# Patient Record
Sex: Male | Born: 1960 | Race: Black or African American | Hispanic: No | Smoking: Current every day smoker
Health system: Southern US, Community
[De-identification: ages and names within clinical notes are randomized; demographics above are authoritative.]

## PROBLEM LIST (undated history)

## (undated) DIAGNOSIS — M1990 Unspecified osteoarthritis, unspecified site: Secondary | ICD-10-CM

## (undated) DIAGNOSIS — F10939 Alcohol use, unspecified with withdrawal, unspecified: Secondary | ICD-10-CM

## (undated) HISTORY — DX: Unspecified osteoarthritis, unspecified site: M19.90

## (undated) HISTORY — DX: Alcohol use, unspecified with withdrawal, unspecified: F10.939

## (undated) HISTORY — PX: EXPLORATORY LAPAROTOMY: SUR591

---

## 2017-04-10 ENCOUNTER — Encounter (HOSPITAL_COMMUNITY): Payer: Self-pay | Admitting: *Deleted

## 2017-04-10 ENCOUNTER — Encounter (HOSPITAL_COMMUNITY): Payer: Self-pay | Admitting: Emergency Medicine

## 2017-04-10 ENCOUNTER — Emergency Department (HOSPITAL_COMMUNITY)
Admission: EM | Admit: 2017-04-10 | Discharge: 2017-04-10 | Disposition: A | Discharge status: Home / Self Care | Payer: Self-pay | Attending: Emergency Medicine | Admitting: Emergency Medicine

## 2017-04-10 ENCOUNTER — Emergency Department (HOSPITAL_COMMUNITY): Payer: Self-pay

## 2017-04-10 DIAGNOSIS — Y939 Activity, unspecified: Secondary | ICD-10-CM | POA: Insufficient documentation

## 2017-04-10 DIAGNOSIS — S0240EA Zygomatic fracture, right side, initial encounter for closed fracture: Secondary | ICD-10-CM | POA: Insufficient documentation

## 2017-04-10 DIAGNOSIS — S01411A Laceration without foreign body of right cheek and temporomandibular area, initial encounter: Secondary | ICD-10-CM | POA: Insufficient documentation

## 2017-04-10 DIAGNOSIS — Y999 Unspecified external cause status: Secondary | ICD-10-CM | POA: Insufficient documentation

## 2017-04-10 DIAGNOSIS — Z5321 Procedure and treatment not carried out due to patient leaving prior to being seen by health care provider: Secondary | ICD-10-CM | POA: Insufficient documentation

## 2017-04-10 DIAGNOSIS — R51 Headache: Secondary | ICD-10-CM | POA: Insufficient documentation

## 2017-04-10 DIAGNOSIS — Y929 Unspecified place or not applicable: Secondary | ICD-10-CM | POA: Insufficient documentation

## 2017-04-10 DIAGNOSIS — F17290 Nicotine dependence, other tobacco product, uncomplicated: Secondary | ICD-10-CM | POA: Insufficient documentation

## 2017-04-10 DIAGNOSIS — S0181XA Laceration without foreign body of other part of head, initial encounter: Secondary | ICD-10-CM

## 2017-04-10 DIAGNOSIS — S01312A Laceration without foreign body of left ear, initial encounter: Secondary | ICD-10-CM | POA: Insufficient documentation

## 2017-04-10 DIAGNOSIS — S02641A Fracture of ramus of right mandible, initial encounter for closed fracture: Secondary | ICD-10-CM | POA: Insufficient documentation

## 2017-04-10 DIAGNOSIS — Z23 Encounter for immunization: Secondary | ICD-10-CM | POA: Insufficient documentation

## 2017-04-10 DIAGNOSIS — T07XXXA Unspecified multiple injuries, initial encounter: Secondary | ICD-10-CM

## 2017-04-10 DIAGNOSIS — S0232XA Fracture of orbital floor, left side, initial encounter for closed fracture: Secondary | ICD-10-CM | POA: Insufficient documentation

## 2017-04-10 MED ORDER — LIDOCAINE-EPINEPHRINE (PF) 1 %-1:200000 IJ SOLN
INTRAMUSCULAR | Status: AC
  Administered 2017-04-10: 30 mL
  Filled 2017-04-10: qty 30

## 2017-04-10 MED ORDER — IBUPROFEN 800 MG PO TABS: 800.0000 mg | ORAL_TABLET | Freq: Three times a day (TID) | ORAL | 0 refills | Status: DC

## 2017-04-10 MED ORDER — POVIDONE-IODINE 10 % EX SOLN
CUTANEOUS | Status: AC
  Filled 2017-04-10: qty 15

## 2017-04-10 MED ORDER — HYDROCODONE-ACETAMINOPHEN 5-325 MG PO TABS
2.0000 | ORAL_TABLET | Freq: Once | ORAL | Status: AC
  Administered 2017-04-10: 2 via ORAL
  Filled 2017-04-10: qty 2

## 2017-04-10 MED ORDER — LIDOCAINE-EPINEPHRINE 1 %-1:100000 IJ SOLN
10.0000 mL | Freq: Once | INTRAMUSCULAR | Status: DC
  Filled 2017-04-10: qty 10

## 2017-04-10 MED ORDER — TETANUS-DIPHTH-ACELL PERTUSSIS 5-2.5-18.5 LF-MCG/0.5 IM SUSP
0.5000 mL | Freq: Once | INTRAMUSCULAR | Status: AC
  Administered 2017-04-10: 0.5 mL via INTRAMUSCULAR
  Filled 2017-04-10: qty 0.5

## 2017-04-10 MED ORDER — HYDROCODONE-ACETAMINOPHEN 5-325 MG PO TABS: 2.0000 | ORAL_TABLET | ORAL | 0 refills | Status: DC | PRN

## 2017-04-10 MED ORDER — AMOXICILLIN 500 MG PO CAPS: 500.0000 mg | ORAL_CAPSULE | Freq: Three times a day (TID) | ORAL | 0 refills | Status: DC
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
## 2017-04-10 NOTE — ED Notes (Signed)
Called for room Not in WR

## 2017-04-10 NOTE — ED Notes (Signed)
Pt called for ED room. No answer in waiting room x 2. Registration reports that they saw pt walk out of ED entrance earlier and pt hasn't returned.
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
## 2017-04-29 ENCOUNTER — Telehealth: Payer: Self-pay | Admitting: Orthopaedic Surgery

## 2017-04-29 NOTE — Telephone Encounter (Signed)
Call from patient, said thought orthopaedic doctor would take care of fractured jaw. Per Dr Hilda LiasKeeling, this would not be for orthopaedic surgeon, it would be ear, nose, throat, and/or oral surgeon.  [facility], for date of emergency services 04/10/17, patient's care was discussed with specialists; [facility] [place] [facility] [place] [facility] [place] [facility]  Strongly encouraged patient to follow up with the specialists to whom he has been referred, per emergency room providers.

## 2017-05-10 DIAGNOSIS — S02601D Fracture of unspecified part of body of right mandible, subsequent encounter for fracture with routine healing: Secondary | ICD-10-CM | POA: Insufficient documentation

## 2017-05-10 DIAGNOSIS — S0240EA Zygomatic fracture, right side, initial encounter for closed fracture: Secondary | ICD-10-CM | POA: Insufficient documentation

## 2019-10-20 ENCOUNTER — Encounter (HOSPITAL_COMMUNITY): Payer: Self-pay | Admitting: *Deleted

## 2019-10-20 ENCOUNTER — Other Ambulatory Visit: Payer: Self-pay

## 2019-10-20 ENCOUNTER — Emergency Department (HOSPITAL_COMMUNITY)
Admission: EM | Admit: 2019-10-20 | Discharge: 2019-10-20 | Disposition: A | Discharge status: Home / Self Care | Payer: Self-pay | Attending: Emergency Medicine | Admitting: Emergency Medicine

## 2019-10-20 ENCOUNTER — Emergency Department (HOSPITAL_COMMUNITY): Payer: Self-pay

## 2019-10-20 DIAGNOSIS — R066 Hiccough: Secondary | ICD-10-CM

## 2019-10-20 DIAGNOSIS — F17200 Nicotine dependence, unspecified, uncomplicated: Secondary | ICD-10-CM | POA: Insufficient documentation

## 2019-10-20 DIAGNOSIS — I10 Essential (primary) hypertension: Secondary | ICD-10-CM | POA: Insufficient documentation

## 2019-10-20 LAB — CBC WITH DIFFERENTIAL/PLATELET
Abs Immature Granulocytes: 0.03 10*3/uL (ref 0.00–0.07)
Basophils Absolute: 0 10*3/uL (ref 0.0–0.1)
Basophils Relative: 1 %
Eosinophils Absolute: 0 10*3/uL (ref 0.0–0.5)
Eosinophils Relative: 0 %
HCT: 40.4 % (ref 39.0–52.0)
Hemoglobin: 13.4 g/dL (ref 13.0–17.0)
Immature Granulocytes: 1 %
Lymphocytes Relative: 15 %
Lymphs Abs: 1 10*3/uL (ref 0.7–4.0)
MCH: 30.7 pg (ref 26.0–34.0)
MCHC: 33.2 g/dL (ref 30.0–36.0)
MCV: 92.7 fL (ref 80.0–100.0)
Monocytes Absolute: 0.4 10*3/uL (ref 0.1–1.0)
Monocytes Relative: 7 %
Neutro Abs: 4.8 10*3/uL (ref 1.7–7.7)
Neutrophils Relative %: 76 %
Platelets: 117 10*3/uL — ABNORMAL LOW (ref 150–400)
RBC: 4.36 MIL/uL (ref 4.22–5.81)
RDW: 12.3 % (ref 11.5–15.5)
WBC: 6.3 10*3/uL (ref 4.0–10.5)
nRBC: 0 % (ref 0.0–0.2)

## 2019-10-20 LAB — BASIC METABOLIC PANEL
Anion gap: 15 (ref 5–15)
BUN: <5 mg/dL — ABNORMAL LOW (ref 6–20)
CO2: 23 mmol/L (ref 22–32)
Calcium: 9.2 mg/dL (ref 8.9–10.3)
Chloride: 94 mmol/L — ABNORMAL LOW (ref 98–111)
Creatinine, Ser: 0.67 mg/dL (ref 0.61–1.24)
GFR calc Af Amer: >60 mL/min (ref >60)
GFR calc non Af Amer: >60 mL/min (ref >60)
Glucose, Bld: 96 mg/dL (ref 70–99)
Potassium: 3.7 mmol/L (ref 3.5–5.1)
Sodium: 132 mmol/L — ABNORMAL LOW (ref 135–145)

## 2019-10-20 LAB — TROPONIN I (HIGH SENSITIVITY): Troponin I (High Sensitivity): 16 ng/L (ref <18)

## 2019-10-20 MED ORDER — CHLORPROMAZINE HCL 25 MG/ML IJ SOLN
25.0000 mg | Freq: Once | INTRAMUSCULAR | Status: AC
  Administered 2019-10-20: 25 mg via INTRAMUSCULAR
  Filled 2019-10-20: qty 1

## 2019-10-20 NOTE — ED Provider Notes (Signed)
[facility] EMERGENCY DEPARTMENT Provider Note   CSN: [id number] Arrival date & time: 10/20/19  1935     History Chief Complaint  Patient presents with  . Hiccups    Caleb Kim is a 59 y.o. male with no known significant past medical history but presents fairly hypertensive and presenting with a 4-day history of intermittent hiccups which have become intractable this evening.  He describes multiple episodes of hiccups x4 days lasting an hour or so and has developed a sharp mid sternal chest pain since the onset which he believes to be due to the hiccup process itself.  He denies shortness of breath, also no fevers or chills, no nausea but has had episodes of vomiting.  He also denies palpitations or diaphoresis.  He has found no alleviators for his symptoms.  This evening he has had a persistent protracted course which started around 4 PM and has yet to resolve.  He tried drinking vinegar which simply made him vomit but did not resolve hiccups.  The history is provided by the patient.       History reviewed. No pertinent past medical history.  There are no problems to display for this patient.   Past Surgical History:  Procedure Laterality Date  . EXPLORATORY LAPAROTOMY         History reviewed. No pertinent family history.  Social History   Tobacco Use  . Smoking status: Current Every Day Smoker    Types: Cigars  . Smokeless tobacco: Never Used  Substance Use Topics  . Alcohol use: Yes    Comment: 2-3 beers daily'  . Drug use: No    Home Medications Prior to Admission medications   Medication Sig Start Date End Date Taking? Authorizing Provider  amoxicillin (AMOXIL) 500 MG capsule Take 1 capsule (500 mg total) by mouth 3 (three) times daily. 04/10/17   Eber Hong, MD  HYDROcodone-acetaminophen (NORCO/VICODIN) 5-325 MG tablet Take 2 tablets by mouth every 4 (four) hours as needed. 04/10/17   Eber Hong, MD  ibuprofen (ADVIL,MOTRIN) 800 MG tablet Take 1  tablet (800 mg total) by mouth 3 (three) times daily. 04/10/17   Eber Hong, MD    Allergies    Patient has no known allergies.  Review of Systems   Review of Systems  Constitutional: Negative for chills and fever.  HENT: Negative for congestion and sore throat.   Eyes: Negative.   Respiratory: Negative for chest tightness and shortness of breath.   Cardiovascular: Positive for chest pain.  Gastrointestinal: Positive for vomiting. Negative for abdominal pain and nausea.  Genitourinary: Negative.   Musculoskeletal: Negative for arthralgias, joint swelling and neck pain.  Skin: Negative.  Negative for rash and wound.  Neurological: Negative for dizziness, weakness, light-headedness, numbness and headaches.  Psychiatric/Behavioral: Negative.     Physical Exam Updated Vital Signs BP (!) 160/102 (BP Location: Right Arm)   Pulse 91   Temp 98.7 F (37.1 C) (Oral)   Resp 16   Ht 5\' 6"  (1.676 m)   Wt 61.2 kg   SpO2 97%   BMI 21.79 kg/m   Physical Exam Vitals and nursing note reviewed.  Constitutional:      Appearance: Normal appearance. He is well-developed.  HENT:     Head: Normocephalic and atraumatic.     Mouth/Throat:     Mouth: Mucous membranes are moist.  Eyes:     Conjunctiva/sclera: Conjunctivae normal.  Cardiovascular:     Rate and Rhythm: Normal rate and regular rhythm.  Heart sounds: Normal heart sounds.     Comments: Patient is hypertensive at 160/102 Pulmonary:     Effort: Pulmonary effort is normal. No respiratory distress.     Breath sounds: Normal breath sounds. No stridor. No wheezing or rhonchi.     Comments: Persistent hiccups during exam.  He is tender at the lower sternum and xiphoid region.  No crepitus, no deformity.  No epigastric pain or guarding. Chest:     Chest wall: Tenderness present.  Abdominal:     General: Bowel sounds are normal.     Palpations: Abdomen is soft.     Tenderness: There is no abdominal tenderness.  Musculoskeletal:         General: Normal range of motion.     Cervical back: Normal range of motion.  Skin:    General: Skin is warm and dry.  Neurological:     Mental Status: He is alert.     ED Results / Procedures / Treatments   Labs (all labs ordered are listed, but only abnormal results are displayed) Labs Reviewed  CBC WITH DIFFERENTIAL/PLATELET  BASIC METABOLIC PANEL  TROPONIN I (HIGH SENSITIVITY)    EKG EKG Interpretation  Date/Time:  Friday October 20 2019 21:17:04 EST Ventricular Rate:  78 PR Interval:    QRS Duration: 87 QT Interval:  408 QTC Calculation: 465 R Axis:   60 Text Interpretation: Sinus rhythm Consider left ventricular hypertrophy Borderline T abnormalities, inferior leads Baseline wander in lead(s) V6 no old EKG Confirmed by Noemi Chapel [phone]) on 10/20/2019 9:36:43 PM   Radiology DG Chest 2 View  Result Date: 10/20/2019 CLINICAL DATA:  Shortness of breath EXAM: CHEST - 2 VIEW COMPARISON:  None. FINDINGS: The heart size and mediastinal contours are within normal limits. Both lungs are clear. The visualized skeletal structures are unremarkable. IMPRESSION: No active cardiopulmonary disease. Electronically Signed   By: Prudencio Pair M.D.   On: 10/20/2019 20:47    Procedures Procedures (including critical care time)  Medications Ordered in ED Medications  chlorproMAZINE (THORAZINE) injection 25 mg (25 mg Intramuscular Given 10/20/19 2112)    ED Course  I have reviewed the triage vital signs and the nursing notes.  Pertinent labs & imaging results that were available during my care of the patient were reviewed by me and considered in my medical decision making (see chart for details).    MDM Rules/Calculators/A&P                      Patient with intractable hiccups, reproducible mid lower sternal pain and episodes of vomiting.  EKG with borderline T wave abnormalities and LVH.  Chest x-ray is clear.  He was given an IM dose of Thorazine with resolution of  hiccups after approx 15 minutes.  Pending labs at this time.    Pt signed out to Nuala Alpha, PA-C who will follow labs and dispo patient.  Final Clinical Impression(s) / ED Diagnoses Final diagnoses:  Hypertension, unspecified type  Hiccups    Rx / DC Orders ED Discharge Orders    None       Landis Martins 10/20/19 2208    Noemi Chapel, MD 10/21/19 [phone]

## 2019-10-20 NOTE — ED Triage Notes (Signed)
Pt with hiccups off and on for past 4 days, took vinegar to try to help alleviate the hiccups which caused him to vomited today.  Pt with emesis several times since 1600/1700 today. Denies nausea.

## 2019-10-20 NOTE — ED Provider Notes (Signed)
Care handoff received from Evalee Jefferson, PA-C at shift change please see her note for full details.  In short 59 year old male presents with a 4-day history of hiccups and chest pain.  Previous team care patient 25 mg injection of Thorazine with resolution of hiccups.  EKG and chest x-ray without acute findings. ` Chest pain lab work was ordered and is pending at shift change.  Plan of care is to follow-up on labs, pending no acute findings discharge. Physical Exam  BP (!) 160/102 (BP Location: Right Arm)   Pulse 91   Temp 98.7 F (37.1 C) (Oral)   Resp 16   Ht 5\' 6"  (1.676 m)   Wt 61.2 kg   SpO2 97%   BMI 21.79 kg/m   Physical Exam Constitutional:      General: He is not in acute distress.    Appearance: Normal appearance. He is well-developed. He is not ill-appearing or diaphoretic.  HENT:     Head: Normocephalic and atraumatic.     Right Ear: External ear normal.     Left Ear: External ear normal.     Nose: Nose normal.  Eyes:     General: Vision grossly intact. Gaze aligned appropriately.     Pupils: Pupils are equal, round, and reactive to light.  Neck:     Trachea: Trachea and phonation normal. No tracheal deviation.  Pulmonary:     Effort: Pulmonary effort is normal. No respiratory distress.  Abdominal:     General: There is no distension.     Palpations: Abdomen is soft.     Tenderness: There is no abdominal tenderness. There is no guarding or rebound.  Musculoskeletal:        General: Normal range of motion.     Cervical back: Normal range of motion.  Skin:    General: Skin is warm and dry.  Neurological:     Mental Status: He is alert.     GCS: GCS eye subscore is 4. GCS verbal subscore is 5. GCS motor subscore is 6.     Comments: Speech is clear and goal oriented, follows commands Major Cranial nerves without deficit, no facial droop Moves extremities without ataxia, coordination intact  Psychiatric:        Behavior: Behavior normal.     ED Course/Procedures      Procedures  MDM  EKG:  Sinus rhythm Consider left ventricular hypertrophy Borderline T abnormalities, inferior leads Baseline wander in lead(s) V6 no old EKG Confirmed by Noemi Chapel [phone]) on 10/20/2019 9:36:43 PM  CBC reassuring no sign of anemia or leukocytosis to suggest infection BMP without emergent electrolyte derangement or evidence of kidney injury High-sensitivity troponin within normal limits, in the setting of 4 days of symptoms no indication for delta troponin  Chest x-ray:  FINDINGS:  The heart size and mediastinal contours are within normal limits.  Both lungs are clear. The visualized skeletal structures are  unremarkable.    IMPRESSION:  No active cardiopulmonary disease.  I have personally reviewed patient's chest x-ray and agree with radiologist interpretation. - 11:10 PM: Patient reassessed resting comfortably no acute distress reports that he is feeling well, no recurrence of symptoms and he would like to go home.  He has been updated on findings as above and states understanding.  He will be discharged with referrals to both primary care and cardiology for follow-up.  Discussed plan of care with Dr. Sabra Heck who agrees.  Suspect chest pain today atypical, possibly reflux related versus musculoskeletal.  Based on history and work-up above doubt ACS, PE, dissection or other emergent cardiopulmonary etiology of symptoms today.  Additionally abdominal exam benign doubt perforation, SBO or other emergent intra-abdominal pathologies at this time.  Additionally patient's blood pressure noted to be elevated today.  He has been informed of this and encouraged to follow-up with PCP for blood pressure recheck and medication management within 1 week.  Patient from signs/symptoms of hypertensive urgency/emergency and to return to the ER if they occur.  At this time there does not appear to be any evidence of an acute emergency medical condition and the patient appears stable  for discharge with appropriate outpatient follow up. Diagnosis was discussed with patient who verbalizes understanding of care plan and is agreeable to discharge. I have discussed return precautions with patient who verbalizes understanding of return precautions. Patient encouraged to follow-up with their PCP and Cardiology. All questions answered.  Note: Portions of this report may have been transcribed using voice recognition software. Every effort was made to ensure accuracy; however, inadvertent computerized transcription errors may still be present.   Caleb Kim 10/20/19 Caleb Kim    Eber Hong, MD 10/21/19 [phone]

## 2019-10-20 NOTE — Discharge Instructions (Signed)
You have been diagnosed today with hiccups, high blood pressure.  At this time there does not appear to be the presence of an emergent medical condition, however there is always the potential for conditions to change. Please read and follow the below instructions.  Please return to the Emergency Department immediately for any new or worsening symptoms. Please be sure to follow up with your Primary Care Provider within one week regarding your visit today; please call their office to schedule an appointment even if you are feeling better for a follow-up visit. Please call the primary care doctor's office on your discharge paperwork to establish local primary care doctor for follow-up visits. You may call the heart care center number on your discharge paperwork to establish a local cardiologist for reevaluation. Your blood pressure was elevated today.  Please discuss this with your primary care provider and schedule a follow-up visit for blood pressure recheck within 1 week and discuss medication management at that time.  Get help right away if you: Get a very bad headache. Start to feel mixed up (confused). Feel weak or numb. Feel faint. Have very bad pain in your: Chest. Belly (abdomen). Throw up more than once. Have trouble breathing. You have trouble breathing or swallowing. You have severe pain in your abdomen. You have any new/concerning or worsening of symptoms  Please read the additional information packets attached to your discharge summary.  Do not take your medicine if  develop an itchy rash, swelling in your mouth or lips, or difficulty breathing; call 911 and seek immediate emergency medical attention if this occurs.  Note: Portions of this text may have been transcribed using voice recognition software. Every effort was made to ensure accuracy; however, inadvertent computerized transcription errors may still be present.

## 2019-12-31 ENCOUNTER — Inpatient Hospital Stay (HOSPITAL_COMMUNITY): Payer: Self-pay

## 2019-12-31 ENCOUNTER — Emergency Department (HOSPITAL_COMMUNITY): Payer: Self-pay

## 2019-12-31 ENCOUNTER — Encounter (HOSPITAL_COMMUNITY): Payer: Self-pay | Admitting: Emergency Medicine

## 2019-12-31 ENCOUNTER — Inpatient Hospital Stay (HOSPITAL_COMMUNITY)
Admission: EM | Admit: 2019-12-31 | Discharge: 2020-01-03 | DRG: 641 | Disposition: A | Discharge status: Home / Self Care | Payer: Self-pay | Attending: Internal Medicine | Admitting: Internal Medicine

## 2019-12-31 ENCOUNTER — Other Ambulatory Visit: Payer: Self-pay

## 2019-12-31 DIAGNOSIS — F10220 Alcohol dependence with intoxication, uncomplicated: Secondary | ICD-10-CM | POA: Diagnosis present

## 2019-12-31 DIAGNOSIS — F10239 Alcohol dependence with withdrawal, unspecified: Secondary | ICD-10-CM | POA: Diagnosis not present

## 2019-12-31 DIAGNOSIS — R636 Underweight: Secondary | ICD-10-CM | POA: Diagnosis present

## 2019-12-31 DIAGNOSIS — R1110 Vomiting, unspecified: Secondary | ICD-10-CM

## 2019-12-31 DIAGNOSIS — Y901 Blood alcohol level of 20-39 mg/100 ml: Secondary | ICD-10-CM | POA: Diagnosis present

## 2019-12-31 DIAGNOSIS — E878 Other disorders of electrolyte and fluid balance, not elsewhere classified: Secondary | ICD-10-CM | POA: Diagnosis present

## 2019-12-31 DIAGNOSIS — E871 Hypo-osmolality and hyponatremia: Principal | ICD-10-CM

## 2019-12-31 DIAGNOSIS — E876 Hypokalemia: Secondary | ICD-10-CM

## 2019-12-31 DIAGNOSIS — R634 Abnormal weight loss: Secondary | ICD-10-CM

## 2019-12-31 DIAGNOSIS — R509 Fever, unspecified: Secondary | ICD-10-CM | POA: Diagnosis present

## 2019-12-31 DIAGNOSIS — Z6821 Body mass index (BMI) 21.0-21.9, adult: Secondary | ICD-10-CM

## 2019-12-31 DIAGNOSIS — F10939 Alcohol use, unspecified with withdrawal, unspecified: Secondary | ICD-10-CM

## 2019-12-31 DIAGNOSIS — K760 Fatty (change of) liver, not elsewhere classified: Secondary | ICD-10-CM | POA: Diagnosis present

## 2019-12-31 DIAGNOSIS — Z720 Tobacco use: Secondary | ICD-10-CM

## 2019-12-31 DIAGNOSIS — F10920 Alcohol use, unspecified with intoxication, uncomplicated: Secondary | ICD-10-CM

## 2019-12-31 DIAGNOSIS — Z20822 Contact with and (suspected) exposure to covid-19: Secondary | ICD-10-CM | POA: Diagnosis present

## 2019-12-31 DIAGNOSIS — R630 Anorexia: Secondary | ICD-10-CM

## 2019-12-31 DIAGNOSIS — D6959 Other secondary thrombocytopenia: Secondary | ICD-10-CM | POA: Diagnosis present

## 2019-12-31 DIAGNOSIS — F17290 Nicotine dependence, other tobacco product, uncomplicated: Secondary | ICD-10-CM | POA: Diagnosis present

## 2019-12-31 DIAGNOSIS — R0982 Postnasal drip: Secondary | ICD-10-CM | POA: Diagnosis present

## 2019-12-31 DIAGNOSIS — R066 Hiccough: Secondary | ICD-10-CM

## 2019-12-31 DIAGNOSIS — E869 Volume depletion, unspecified: Secondary | ICD-10-CM | POA: Diagnosis present

## 2019-12-31 DIAGNOSIS — F419 Anxiety disorder, unspecified: Secondary | ICD-10-CM | POA: Diagnosis present

## 2019-12-31 DIAGNOSIS — Z791 Long term (current) use of non-steroidal anti-inflammatories (NSAID): Secondary | ICD-10-CM

## 2019-12-31 DIAGNOSIS — R7401 Elevation of levels of liver transaminase levels: Secondary | ICD-10-CM

## 2019-12-31 HISTORY — DX: Hypo-osmolality and hyponatremia: E87.1

## 2019-12-31 HISTORY — DX: Hiccough: R06.6

## 2019-12-31 HISTORY — DX: Alcohol use, unspecified with intoxication, uncomplicated: F10.920

## 2019-12-31 HISTORY — DX: Vomiting, unspecified: R11.10

## 2019-12-31 HISTORY — DX: Hypokalemia: E87.6

## 2019-12-31 LAB — COMPREHENSIVE METABOLIC PANEL
ALT: 70 U/L — ABNORMAL HIGH (ref 0–44)
ALT: 56 U/L — ABNORMAL HIGH (ref 0–44)
AST: 86 U/L — ABNORMAL HIGH (ref 15–41)
AST: 76 U/L — ABNORMAL HIGH (ref 15–41)
Albumin: 4.4 g/dL (ref 3.5–5.0)
Albumin: 3.5 g/dL (ref 3.5–5.0)
Alkaline Phosphatase: 89 U/L (ref 38–126)
Alkaline Phosphatase: 71 U/L (ref 38–126)
Anion gap: 16 — ABNORMAL HIGH (ref 5–15)
Anion gap: 15 (ref 5–15)
BUN: 11 mg/dL (ref 6–20)
BUN: 9 mg/dL (ref 6–20)
CO2: 28 mmol/L (ref 22–32)
CO2: 23 mmol/L (ref 22–32)
Calcium: 9.2 mg/dL (ref 8.9–10.3)
Calcium: 8.5 mg/dL — ABNORMAL LOW (ref 8.9–10.3)
Chloride: 73 mmol/L — ABNORMAL LOW (ref 98–111)
Chloride: 83 mmol/L — ABNORMAL LOW (ref 98–111)
Creatinine, Ser: 0.64 mg/dL (ref 0.61–1.24)
Creatinine, Ser: 0.64 mg/dL (ref 0.61–1.24)
GFR calc Af Amer: >60 mL/min (ref >60)
GFR calc Af Amer: >60 mL/min (ref >60)
GFR calc non Af Amer: >60 mL/min (ref >60)
GFR calc non Af Amer: >60 mL/min (ref >60)
Glucose, Bld: 94 mg/dL (ref 70–99)
Glucose, Bld: 82 mg/dL (ref 70–99)
Potassium: 3.1 mmol/L — ABNORMAL LOW (ref 3.5–5.1)
Potassium: 4.4 mmol/L (ref 3.5–5.1)
Sodium: 117 mmol/L — CL (ref 135–145)
Sodium: 121 mmol/L — ABNORMAL LOW (ref 135–145)
Total Bilirubin: 1.3 mg/dL — ABNORMAL HIGH (ref 0.3–1.2)
Total Bilirubin: 1.9 mg/dL — ABNORMAL HIGH (ref 0.3–1.2)
Total Protein: 8.8 g/dL — ABNORMAL HIGH (ref 6.5–8.1)
Total Protein: 6.9 g/dL (ref 6.5–8.1)

## 2019-12-31 LAB — ETHANOL: Alcohol, Ethyl (B): 29 mg/dL — ABNORMAL HIGH (ref <10)

## 2019-12-31 LAB — CBC WITH DIFFERENTIAL/PLATELET
Abs Immature Granulocytes: 0.05 10*3/uL (ref 0.00–0.07)
Basophils Absolute: 0 10*3/uL (ref 0.0–0.1)
Basophils Relative: 0 %
Eosinophils Absolute: 0 10*3/uL (ref 0.0–0.5)
Eosinophils Relative: 0 %
HCT: 39.7 % (ref 39.0–52.0)
Hemoglobin: 13.8 g/dL (ref 13.0–17.0)
Immature Granulocytes: 1 %
Lymphocytes Relative: 11 %
Lymphs Abs: 0.9 10*3/uL (ref 0.7–4.0)
MCH: 30.6 pg (ref 26.0–34.0)
MCHC: 34.8 g/dL (ref 30.0–36.0)
MCV: 88 fL (ref 80.0–100.0)
Monocytes Absolute: 1 10*3/uL (ref 0.1–1.0)
Monocytes Relative: 12 %
Neutro Abs: 6.5 10*3/uL (ref 1.7–7.7)
Neutrophils Relative %: 76 %
Platelets: 144 10*3/uL — ABNORMAL LOW (ref 150–400)
RBC: 4.51 MIL/uL (ref 4.22–5.81)
RDW: 12.2 % (ref 11.5–15.5)
WBC: 8.5 10*3/uL (ref 4.0–10.5)
nRBC: 0 % (ref 0.0–0.2)

## 2019-12-31 LAB — RAPID URINE DRUG SCREEN, HOSP PERFORMED
Amphetamines: NOT DETECTED
Barbiturates: NOT DETECTED
Benzodiazepines: NOT DETECTED
Cocaine: NOT DETECTED
Opiates: NOT DETECTED
Tetrahydrocannabinol: NOT DETECTED

## 2019-12-31 LAB — CBC
HCT: 35.9 % — ABNORMAL LOW (ref 39.0–52.0)
Hemoglobin: 12.4 g/dL — ABNORMAL LOW (ref 13.0–17.0)
MCH: 30.1 pg (ref 26.0–34.0)
MCHC: 34.5 g/dL (ref 30.0–36.0)
MCV: 87.1 fL (ref 80.0–100.0)
Platelets: 108 10*3/uL — ABNORMAL LOW (ref 150–400)
RBC: 4.12 MIL/uL — ABNORMAL LOW (ref 4.22–5.81)
RDW: 12 % (ref 11.5–15.5)
WBC: 7.3 10*3/uL (ref 4.0–10.5)
nRBC: 0 % (ref 0.0–0.2)

## 2019-12-31 LAB — BASIC METABOLIC PANEL
Anion gap: 12 (ref 5–15)
Anion gap: 12 (ref 5–15)
BUN: 9 mg/dL (ref 6–20)
BUN: 10 mg/dL (ref 6–20)
CO2: 25 mmol/L (ref 22–32)
CO2: 25 mmol/L (ref 22–32)
Calcium: 8.6 mg/dL — ABNORMAL LOW (ref 8.9–10.3)
Calcium: 8.5 mg/dL — ABNORMAL LOW (ref 8.9–10.3)
Chloride: 86 mmol/L — ABNORMAL LOW (ref 98–111)
Chloride: 89 mmol/L — ABNORMAL LOW (ref 98–111)
Creatinine, Ser: 0.74 mg/dL (ref 0.61–1.24)
Creatinine, Ser: 0.73 mg/dL (ref 0.61–1.24)
GFR calc Af Amer: >60 mL/min (ref >60)
GFR calc Af Amer: >60 mL/min (ref >60)
GFR calc non Af Amer: >60 mL/min (ref >60)
GFR calc non Af Amer: >60 mL/min (ref >60)
Glucose, Bld: 114 mg/dL — ABNORMAL HIGH (ref 70–99)
Glucose, Bld: 99 mg/dL (ref 70–99)
Potassium: 3.7 mmol/L (ref 3.5–5.1)
Potassium: 3.9 mmol/L (ref 3.5–5.1)
Sodium: 123 mmol/L — ABNORMAL LOW (ref 135–145)
Sodium: 126 mmol/L — ABNORMAL LOW (ref 135–145)

## 2019-12-31 LAB — TSH: TSH: 0.788 u[IU]/mL (ref 0.350–4.500)

## 2019-12-31 LAB — MAGNESIUM
Magnesium: 2.1 mg/dL (ref 1.7–2.4)
Magnesium: 2.2 mg/dL (ref 1.7–2.4)

## 2019-12-31 LAB — URINALYSIS, ROUTINE W REFLEX MICROSCOPIC
Bilirubin Urine: NEGATIVE
Glucose, UA: NEGATIVE mg/dL
Hgb urine dipstick: NEGATIVE
Ketones, ur: NEGATIVE mg/dL
Leukocytes,Ua: NEGATIVE
Nitrite: NEGATIVE
Protein, ur: NEGATIVE mg/dL
Specific Gravity, Urine: 1.008 (ref 1.005–1.030)
pH: 5 (ref 5.0–8.0)

## 2019-12-31 LAB — MRSA PCR SCREENING: MRSA by PCR: NEGATIVE

## 2019-12-31 LAB — RESPIRATORY PANEL BY RT PCR (FLU A&B, COVID)
Influenza A by PCR: NEGATIVE
Influenza B by PCR: NEGATIVE
SARS Coronavirus 2 by RT PCR: NEGATIVE

## 2019-12-31 LAB — PHOSPHORUS: Phosphorus: 3.2 mg/dL (ref 2.5–4.6)

## 2019-12-31 LAB — CREATININE, URINE, RANDOM: Creatinine, Urine: 23.8 mg/dL

## 2019-12-31 LAB — VITAMIN B12: Vitamin B-12: 1038 pg/mL — ABNORMAL HIGH (ref 180–914)

## 2019-12-31 LAB — HIV ANTIBODY (ROUTINE TESTING W REFLEX): HIV Screen 4th Generation wRfx: NONREACTIVE

## 2019-12-31 LAB — OSMOLALITY: Osmolality: 263 mOsm/kg — ABNORMAL LOW (ref 275–295)

## 2019-12-31 LAB — SODIUM, URINE, RANDOM
Sodium, Ur: <10 mmol/L
Sodium, Ur: <10 mmol/L

## 2019-12-31 LAB — FOLATE: Folate: 6.7 ng/mL (ref >5.9)

## 2019-12-31 LAB — OSMOLALITY, URINE: Osmolality, Ur: 107 mOsm/kg — ABNORMAL LOW (ref 300–900)

## 2019-12-31 MED ORDER — LORAZEPAM 2 MG/ML IJ SOLN
0.0000 mg | Freq: Three times a day (TID) | INTRAMUSCULAR | Status: DC
  Administered 2020-01-02: 2 mg via INTRAVENOUS
  Filled 2019-12-31 (×2): qty 1

## 2019-12-31 MED ORDER — LORAZEPAM 1 MG PO TABS
1.0000 mg | ORAL_TABLET | ORAL | Status: AC | PRN
  Administered 2020-01-02: 09:00:00 2 mg via ORAL
  Filled 2019-12-31: qty 2

## 2019-12-31 MED ORDER — IOHEXOL 300 MG/ML  SOLN
100.0000 mL | Freq: Once | INTRAMUSCULAR | Status: AC
  Administered 2019-12-31: 10:00:00 80 mL via INTRAVENOUS

## 2019-12-31 MED ORDER — CHLORHEXIDINE GLUCONATE CLOTH 2 % EX PADS
6.0000 | MEDICATED_PAD | Freq: Every day | CUTANEOUS | Status: DC
  Administered 2019-12-31 – 2020-01-01 (×2): 6 via TOPICAL

## 2019-12-31 MED ORDER — LORAZEPAM 2 MG/ML IJ SOLN: 1.0000 mg | INTRAMUSCULAR | Status: AC | PRN

## 2019-12-31 MED ORDER — ENOXAPARIN SODIUM 40 MG/0.4ML ~~LOC~~ SOLN
40.0000 mg | SUBCUTANEOUS | Status: DC
  Administered 2019-12-31 – 2020-01-03 (×4): 40 mg via SUBCUTANEOUS
  Filled 2019-12-31 (×4): qty 0.4

## 2019-12-31 MED ORDER — LORAZEPAM 2 MG/ML IJ SOLN
0.0000 mg | INTRAMUSCULAR | Status: AC
  Administered 2019-12-31: 20:00:00 1 mg via INTRAVENOUS
  Administered 2020-01-01 (×2): 2 mg via INTRAVENOUS
  Administered 2020-01-01 (×2): 1 mg via INTRAVENOUS
  Filled 2019-12-31 (×5): qty 1

## 2019-12-31 MED ORDER — THIAMINE HCL 100 MG PO TABS
100.0000 mg | ORAL_TABLET | Freq: Every day | ORAL | Status: DC
  Administered 2019-12-31 – 2020-01-01 (×2): 100 mg via ORAL
  Filled 2019-12-31 (×3): qty 1

## 2019-12-31 MED ORDER — ONDANSETRON HCL 4 MG PO TABS: 4.0000 mg | ORAL_TABLET | Freq: Four times a day (QID) | ORAL | Status: DC | PRN

## 2019-12-31 MED ORDER — ADULT MULTIVITAMIN W/MINERALS CH
1.0000 | ORAL_TABLET | Freq: Every day | ORAL | Status: DC
  Administered 2019-12-31 – 2020-01-03 (×4): 1 via ORAL
  Filled 2019-12-31 (×4): qty 1

## 2019-12-31 MED ORDER — SODIUM CHLORIDE 0.9 % IV SOLN: INTRAVENOUS | Status: DC

## 2019-12-31 MED ORDER — IBUPROFEN 400 MG PO TABS: 400.0000 mg | ORAL_TABLET | Freq: Four times a day (QID) | ORAL | Status: DC | PRN

## 2019-12-31 MED ORDER — POTASSIUM CHLORIDE CRYS ER 20 MEQ PO TBCR
40.0000 meq | EXTENDED_RELEASE_TABLET | Freq: Once | ORAL | Status: AC
  Administered 2019-12-31: 04:00:00 40 meq via ORAL
  Filled 2019-12-31: qty 2

## 2019-12-31 MED ORDER — CHLORPROMAZINE HCL 25 MG/ML IJ SOLN
25.0000 mg | Freq: Once | INTRAMUSCULAR | Status: AC
  Administered 2019-12-31: 02:00:00 25 mg via INTRAMUSCULAR
  Filled 2019-12-31: qty 1

## 2019-12-31 MED ORDER — SODIUM CHLORIDE 0.9 % IV SOLN
12.5000 mg | Freq: Four times a day (QID) | INTRAVENOUS | Status: DC | PRN
  Administered 2019-12-31 – 2020-01-01 (×3): 12.5 mg via INTRAVENOUS
  Filled 2019-12-31 (×3): qty 0.5

## 2019-12-31 MED ORDER — FOLIC ACID 1 MG PO TABS
1.0000 mg | ORAL_TABLET | Freq: Every day | ORAL | Status: DC
  Administered 2019-12-31 – 2020-01-03 (×4): 1 mg via ORAL
  Filled 2019-12-31 (×5): qty 1

## 2019-12-31 MED ORDER — THIAMINE HCL 100 MG/ML IJ SOLN
100.0000 mg | Freq: Every day | INTRAMUSCULAR | Status: DC
  Administered 2020-01-02: 14:00:00 100 mg via INTRAVENOUS
  Filled 2019-12-31: qty 2

## 2019-12-31 MED ORDER — SODIUM CHLORIDE 0.9 % IV BOLUS
1000.0000 mL | Freq: Once | INTRAVENOUS | Status: AC
  Administered 2019-12-31: 04:00:00 1000 mL via INTRAVENOUS

## 2019-12-31 MED ORDER — ONDANSETRON HCL 4 MG/2ML IJ SOLN: 4.0000 mg | Freq: Four times a day (QID) | INTRAMUSCULAR | Status: DC | PRN

## 2019-12-31 MED ORDER — ENSURE ENLIVE PO LIQD
237.0000 mL | Freq: Two times a day (BID) | ORAL | Status: DC
  Administered 2019-12-31 – 2020-01-03 (×6): 237 mL via ORAL

## 2019-12-31 NOTE — ED Triage Notes (Signed)
Pt C/O hiccups and emesis that started on Monday. Pt denies pain.

## 2019-12-31 NOTE — H&P (Signed)
History and Physical    Caleb Kim VHQ:[id number] DOB: 03-21-1961 DOA: 12/31/2019  PCP: Patient, No Pcp Per (Confirm with patient/family/NH records and if not entered, this has to be entered at [facility] point of entry) Patient coming from: Home  I have personally briefly reviewed patient's old medical records in [facility]  Chief Complaint: Hiccups and vomiting  HPI: Caleb Kim is a 59 y.o. male with no chronic medical problems presented to ED for evaluation of hiccups and vomiting.  Patient states that he has been having continuous hiccups for the last 6 days and sometimes the hiccups or so severe that he has severe vomiting with hiccups.  Patient states that he is not eating and drinking well since the hiccups have started and his appetite is completely gone.  Patient further mentioned that he recently lost about 8 to 10 pounds in 1 to 2 weeks secondary to poor appetite and he feels very weak and tired.  Patient otherwise denies fever, chills, chest pain, shortness of breath, nausea, abdominal pain, urinary symptoms and anxiety.  Patient admits of drinking alcohol daily and also admits of smoking 3 cigars every day.  Patient denies using illicit drugs.  Patient further mentioned that he is not seeing a primary care physician because of insurance issues and he recently started a new job and hopefully will get the health insurance soon.  (For level 3, the HPI must include 4+ descriptors: Location, Quality, Severity, Duration, Timing, Context, modifying factors, associated signs/symptoms and/or status of 3+ chronic problems.)  (Please avoid self-populating past medical history here) (The initial 2-3 lines should be focused and good to copy and paste in the HPI section of the daily progress note).  ED Course: On arrival to the ED patient had temperature of 99.1, blood pressure 145/93, heart rate 92, respiratory rate 17 and oxygen saturation 97% on room air.  Blood work showed WBC 8.5, hemoglobin  13.8, sodium 117, potassium 3.1, chloride 73, bicarb 28, BUN 11, creatinine 0.64, glucose 94, magnesium 2.1 and mild transaminitis.  Alcohol level was 29.  Chest x-ray negative for acute cardiopulmonary problem.  Patient was given a bolus of IV normal saline in the ED along with Thorazine injection and 40 mEq of potassium.  Review of Systems: As per HPI otherwise 10 point review of systems negative.  Unacceptable ROS statements: "10 systems reviewed," "Extensive" (without elaboration).  Acceptable ROS statements: "All others negative," "All others reviewed and are negative," and "All others unremarkable," with at LEAST ONE ROS documented Can't double dip - if using for HPI can't use for ROS  History reviewed. No pertinent past medical history.  Past Surgical History:  Procedure Laterality Date  . EXPLORATORY LAPAROTOMY       reports that he has been smoking cigars. He has never used smokeless tobacco. He reports current alcohol use. He reports that he does not use drugs.  No Known Allergies  No family history on file. Family history reviewed but not pertinent Unacceptable: Noncontributory, unremarkable, or negative. Acceptable: (example)Family history negative for heart disease  Prior to Admission medications   Medication Sig Start Date End Date Taking? Authorizing Provider  amoxicillin (AMOXIL) 500 MG capsule Take 1 capsule (500 mg total) by mouth 3 (three) times daily. 04/10/17   Eber Hong, MD  HYDROcodone-acetaminophen (NORCO/VICODIN) 5-325 MG tablet Take 2 tablets by mouth every 4 (four) hours as needed. 04/10/17   Eber Hong, MD  ibuprofen (ADVIL,MOTRIN) 800 MG tablet Take 1 tablet (800 mg total) by mouth  3 (three) times daily. 04/10/17   Eber Hong, MD    Physical Exam: Vitals:   12/31/19 0028 12/31/19 0029  BP: (!) 145/93   Pulse: 92   Resp: 17   Temp: 99.1 F (37.3 C)   TempSrc: Oral   SpO2: 97%   Weight:  61.2 kg  Height:  5\' 6"  (1.676 m)    Constitutional:  NAD, calm, comfortable Vitals:   12/31/19 0028 12/31/19 0029  BP: (!) 145/93   Pulse: 92   Resp: 17   Temp: 99.1 F (37.3 C)   TempSrc: Oral   SpO2: 97%   Weight:  61.2 kg  Height:  5\' 6"  (1.676 m)    General: 59 year old African-American male who looks tired and sick. Eyes: PERRL, lids and conjunctivae normal ENMT: Mucous membranes are moist. Posterior pharynx clear of any exudate or lesions.Normal dentition.  Neck: normal, supple, no masses, no thyromegaly Respiratory: clear to auscultation bilaterally, no wheezing, no crackles. Normal respiratory effort. No accessory muscle use.  Cardiovascular: Regular rate and rhythm, no murmurs / rubs / gallops. No extremity edema. 2+ pedal pulses. No carotid bruits.  Abdomen: no tenderness, no masses palpated. No hepatosplenomegaly. Bowel sounds positive.  Musculoskeletal: no clubbing / cyanosis. No joint deformity upper and lower extremities. Good ROM, no contractures. Normal muscle tone.  Skin: no rashes, lesions, ulcers. No induration Neurologic: Patient is awake alert and oriented with slow speech most likely secondary to alcohol intoxication.  CN 2-12 grossly intact. Sensation intact, DTR normal. Strength 5/5 in all 4.  Psychiatric: Normal judgment and insight. Alert and oriented x 3.  Mildly anxious  (Anything < 9 systems with 2 bullets each down codes to level 1) (If patient refuses exam can't bill higher level) (Make sure to document decubitus ulcers present on admission -- if possible -- and whether patient has chronic indwelling catheter at time of admission)  Labs on Admission: I have personally reviewed following labs and imaging studies  CBC: Recent Labs  Lab 12/31/19 0207  WBC 8.5  NEUTROABS 6.5  HGB 13.8  HCT 39.7  MCV 88.0  PLT 144*   Basic Metabolic Panel: Recent Labs  Lab 12/31/19 0207  NA 117*  K 3.1*  CL 73*  CO2 28  GLUCOSE 94  BUN 11  CREATININE 0.64  CALCIUM 9.2  MG 2.1   GFR: Estimated  Creatinine Clearance: 89.3 mL/min (by C-G formula based on SCr of 0.64 mg/dL). Liver Function Tests: Recent Labs  Lab 12/31/19 0207  AST 86*  ALT 70*  ALKPHOS 89  BILITOT 1.3*  PROT 8.8*  ALBUMIN 4.4   No results for input(s): LIPASE, AMYLASE in the last 168 hours. No results for input(s): AMMONIA in the last 168 hours. Coagulation Profile: No results for input(s): INR, PROTIME in the last 168 hours. Cardiac Enzymes: No results for input(s): CKTOTAL, CKMB, CKMBINDEX, TROPONINI in the last 168 hours. BNP (last 3 results) No results for input(s): PROBNP in the last 8760 hours. HbA1C: No results for input(s): HGBA1C in the last 72 hours. CBG: No results for input(s): GLUCAP in the last 168 hours. Lipid Profile: No results for input(s): CHOL, HDL, LDLCALC, TRIG, CHOLHDL, LDLDIRECT in the last 72 hours. Thyroid Function Tests: Recent Labs    12/31/19 0208  TSH 0.788   Anemia Panel: No results for input(s): VITAMINB12, FOLATE, FERRITIN, TIBC, IRON, RETICCTPCT in the last 72 hours. Urine analysis:    Component Value Date/Time   COLORURINE YELLOW 12/31/2019 0342   APPEARANCEUR CLEAR  12/31/2019 0342   LABSPEC 1.008 12/31/2019 0342   PHURINE 5.0 12/31/2019 0342   GLUCOSEU NEGATIVE 12/31/2019 0342   HGBUR NEGATIVE 12/31/2019 0342   BILIRUBINUR NEGATIVE 12/31/2019 [place] 12/31/2019 0342   PROTEINUR NEGATIVE 12/31/2019 0342   NITRITE NEGATIVE 12/31/2019 0342   LEUKOCYTESUR NEGATIVE 12/31/2019 0342    Radiological Exams on Admission: DG Chest 2 View  Result Date: 12/31/2019 CLINICAL DATA:  Hiccups for 1 week, emesis, weight loss, tobacco abuse EXAM: CHEST - 2 VIEW COMPARISON:  10/20/2019 FINDINGS: The heart size and mediastinal contours are within normal limits. Both lungs are clear. The visualized skeletal structures are unremarkable. IMPRESSION: No active cardiopulmonary disease. Electronically Signed   By: Randa Ngo M.D.   On: 12/31/2019 02:00     EKG: Independently reviewed.  Assessment/Plan Principal Problem:   Hyponatremia with hypochloremia present on admission Patient denies any headaches or confusion at this time.  Patient was given 1 L of IV normal saline in the ED. Hyponatremia is most likely secondary to vomiting, poor oral intake or it could be secondary to beer potomania. Continue normal saline at the rate of 125 mL/h. BMP every 4 hour. Goal is to increase sodium by 8 to 10 mEq in 24 hours.  Active Problems:   Hypokalemia Patient had potassium level of 3.1.  40 mEq of potassium is given in the ED. We will continue to monitor potassium level and repleted as needed.    Alcoholic intoxication without complication [facility]) Patient came with alcohol level of 29.  Patient is mildly tachycardic with some anxiety but no signs of acute confusion, tremors or diaphoresis but due to chronic alcoholism it is highly likely that patient will go into alcohol withdrawal therefore CIWA protocol with Ativan ordered. Folate and thiamine ordered.   Intractable hiccups Patient is given 1 dose of chlorpromazine in the ED and no hiccups till now.  We will continue to monitor.  Vomiting Zofran every 6 hours as needed for nausea and vomiting ordered.    Tobacco abuse Tobacco cessation counseling done in detail.     Anorexia Patient also complaining of poor appetite and weight loss.  Continue to monitor. It is encouraged to eat small meals and decrease intake of alcohol.  (please populate well all problems here in Problem List. (For example, if patient is on BP meds at home and you resume or decide to hold them, it is a problem that needs to be her. Same for CAD, COPD, HLD and so on)    DVT prophylaxis: Lovenox Code Status: Full code Family Communication: Family member present at the bedside Disposition Plan: Patient will be discharged to home Consults called: None Admission status: Inpatient/stepdown   Edmonia Lynch MD [facility] Pager [phone]   If 7PM-7AM, please contact night-coverage [email or web address] Password   12/31/2019, 4:53 AM

## 2019-12-31 NOTE — Progress Notes (Signed)
Pt fidgety and spilling things. He is getting up out of bed; appears stable on his feet but this RN did ask him not to get up without someone in the room. He acts appropriately other than the constant fidgeting and slight anxiety. Administered 1 mg ativan per CIWA. Pt is back in bed with the bed alarm on. Will continue to monitor patient.

## 2019-12-31 NOTE — Progress Notes (Signed)
PROGRESS NOTE  Ayodele Sangalang MVE:[id number] DOB: [birth year] DOA: 12/31/2019 PCP: Patient, No Pcp Per  Brief History:  59 year old male with a history of alcohol and tobacco abuse presented secondary to intractable hiccups.  The patient states that he began hiccuping on 12/25/2019 with intractable nausea and vomiting resulting from his hiccups.  The patient continues to drink 6 x 40 oz beers on a daily basis.  He has done so for approximately 10 years.  He denies any illegal drugs.  He smokes approximately 3 to 4 cigars on a daily basis.  He denies any hematemesis, abdominal pain, diarrhea, hematochezia, melena.  He does have abdominal discomfort when his hiccups recur.  He has not had any fevers, chills, chest pain, hemoptysis, shortness of breath.  He has had a cough with white sputum production.  He has not had any dysuria or hematuria.  He denies any new medications or over-the-counter medications. In the emergency department, the patient had low-grade temperature 9 9.1 F.  He was hemodynamically stable with oxygen saturation 96 to 97% room air.  Sodium was 117.  Serum creatinine 0.64.  AST 86, ALT 70, alkaline phosphatase 89, total bilirubin 1.3.  WBC 8.5, hemoglobin 13.8, platelets 1 44,000.  Because of his hyponatremia the patient was admitted for further evaluation.  Assessment/Plan: Hyponatremia -Secondary to [place] and volume depletion from his decreased oral intake and vomiting -Monitor sodium every 4 hours -Continue IV normal saline -Urine osmolarity -Serum osmolarity -Urine sodium <10 -Urine creatinine  Alcohol abuse -Alcohol withdrawal protocol -Urine drug screen -alcohol level 29  Intractable hiccups and vomiting -CT abdomen and pelvis -As needed Thorazine -Personally reviewed chest x-ray--no infiltrates or edema  Upper Airway Cough Syndrome -this may be inciting his hiccoughs -jolly ranchers -hycodan  Tobacco abuse -Tobacco cessation discussed   Transaminasemia -Hepatitis B surface antigen -Hepatitis C antibody -Right upper quadrant ultrasound  Thrombocytopenia -Secondary to the patient's alcohol abuse -G83 -Folic acid -MOQ--9,476 -A.m. CBC      Disposition Plan: Patient From: Hom D/C Place: Home - 2-3  Days Barriers: Not Clinically Stable--intractable vomiting and hiccoughs  Family Communication:   Significant other at bedside updated 5/9  Consultants:  none  Code Status:  FULL   DVT Prophylaxis:  [place] Lovenox   Procedures: As Listed in Progress Note Above  Antibiotics: None   Total time spent 40 minutes.  Greater than 50% spent face to face counseling and coordinating care.     Subjective: Patient continues to have her coughs and vomiting.  His appetite is poor.  He denies any headache, chest pain, shortness breath, diarrhea, hematochezia, melena.  He has cough with white sputum.  There is no hemoptysis  Objective: Vitals:   12/31/19 0500 12/31/19 0515 12/31/19 0530 12/31/19 0545  BP: 132/77  115/81   Pulse:   (!) 104   Resp: 13 19 18 17   Temp:      TempSrc:      SpO2:   100%   Weight:      Height:        Intake/Output Summary (Last 24 hours) at 12/31/2019 0958 Last data filed at 12/31/2019 5465 Gross per 24 hour  Intake 1000 ml  Output -  Net 1000 ml   Weight change:  Exam:   General:  Pt is alert, follows commands appropriately, not in acute distress  HEENT: No icterus, No thrush, No neck mass, [place]/AT  Cardiovascular: RRR, S1/S2, no rubs, no gallops  Respiratory: Bibasilar rales but no wheezing.  Good air movement  Abdomen: Soft/+BS, non tender, non distended, no guarding  Extremities: No edema, No lymphangitis, No petechiae, No rashes, no synovitis   Data Reviewed: I have personally reviewed following labs and imaging studies Basic Metabolic Panel: Recent Labs  Lab 12/31/19 0207 12/31/19 0511  NA 117* 121*  K 3.1* 4.4  CL 73* 83*  CO2 28 23  GLUCOSE 94 82  BUN 11 9   CREATININE 0.64 0.64  CALCIUM 9.2 8.5*  MG 2.1 2.2  PHOS  --  3.2   Liver Function Tests: Recent Labs  Lab 12/31/19 0207 12/31/19 0511  AST 86* 76*  ALT 70* 56*  ALKPHOS 89 71  BILITOT 1.3* 1.9*  PROT 8.8* 6.9  ALBUMIN 4.4 3.5   No results for input(s): LIPASE, AMYLASE in the last 168 hours. No results for input(s): AMMONIA in the last 168 hours. Coagulation Profile: No results for input(s): INR, PROTIME in the last 168 hours. CBC: Recent Labs  Lab 12/31/19 0207 12/31/19 0511  WBC 8.5 7.3  NEUTROABS 6.5  --   HGB 13.8 12.4*  HCT 39.7 35.9*  MCV 88.0 87.1  PLT 144* 108*   Cardiac Enzymes: No results for input(s): CKTOTAL, CKMB, CKMBINDEX, TROPONINI in the last 168 hours. BNP: Invalid input(s): POCBNP CBG: No results for input(s): GLUCAP in the last 168 hours. HbA1C: No results for input(s): HGBA1C in the last 72 hours. Urine analysis:    Component Value Date/Time   COLORURINE YELLOW 12/31/2019 0342   APPEARANCEUR CLEAR 12/31/2019 0342   LABSPEC 1.008 12/31/2019 0342   PHURINE 5.0 12/31/2019 0342   GLUCOSEU NEGATIVE 12/31/2019 0342   HGBUR NEGATIVE 12/31/2019 0342   BILIRUBINUR NEGATIVE 12/31/2019 0342   KETONESUR NEGATIVE 12/31/2019 0342   PROTEINUR NEGATIVE 12/31/2019 0342   NITRITE NEGATIVE 12/31/2019 0342   LEUKOCYTESUR NEGATIVE 12/31/2019 0342   Sepsis Labs: @LABRCNTIP (procalcitonin:4,lacticidven:4) ) Recent Results (from the past 240 hour(s))  Respiratory Panel by RT PCR (Flu A&B, Covid) - Nasopharyngeal Swab     Status: None   Collection Time: 12/31/19  3:39 AM   Specimen: Nasopharyngeal Swab  Result Value Ref Range Status   SARS Coronavirus 2 by RT PCR NEGATIVE NEGATIVE Final    Comment: (NOTE) SARS-CoV-2 target nucleic acids are NOT DETECTED. The SARS-CoV-2 RNA is generally detectable in upper respiratoy specimens during the acute phase of infection. The lowest concentration of SARS-CoV-2 viral copies this assay can detect is 131  copies/mL. A negative result does not preclude SARS-Cov-2 infection and should not be used as the sole basis for treatment or other patient management decisions. A negative result may occur with  improper specimen collection/handling, submission of specimen other than nasopharyngeal swab, presence of viral mutation(s) within the areas targeted by this assay, and inadequate number of viral copies (<131 copies/mL). A negative result must be combined with clinical observations, patient history, and epidemiological information. The expected result is Negative. Fact Sheet for Patients:  03/01/20 Fact Sheet for Healthcare Providers:  https://www.moore.com/ This test is not yet ap proved or cleared by the https://www.young.biz/ FDA and  has been authorized for detection and/or diagnosis of SARS-CoV-2 by FDA under an Emergency Use Authorization (EUA). This EUA will remain  in effect (meaning this test can be used) for the duration of the COVID-19 declaration under Section 564(b)(1) of the Act, 21 U.S.C. section 360bbb-3(b)(1), unless the authorization is terminated or revoked sooner.    Influenza A by PCR NEGATIVE NEGATIVE Final  Influenza B by PCR NEGATIVE NEGATIVE Final    Comment: (NOTE) The Xpert Xpress SARS-CoV-2/FLU/RSV assay is intended as an aid in  the diagnosis of influenza from Nasopharyngeal swab specimens and  should not be used as a sole basis for treatment. Nasal washings and  aspirates are unacceptable for Xpert Xpress SARS-CoV-2/FLU/RSV  testing. Fact Sheet for Patients: https://www.moore.com/ Fact Sheet for Healthcare Providers: https://www.young.biz/ This test is not yet approved or cleared by the [place] FDA and  has been authorized for detection and/or diagnosis of SARS-CoV-2 by  FDA under an Emergency Use Authorization (EUA). This EUA will remain  in effect (meaning this test can  be used) for the duration of the  Covid-19 declaration under Section 564(b)(1) of the Act, 21  U.S.C. section 360bbb-3(b)(1), unless the authorization is  terminated or revoked. Performed at [facility], [address], [place], [place] [address]      Scheduled Meds: . Chlorhexidine Gluconate Cloth  6 each Topical Daily  . enoxaparin (LOVENOX) injection  40 mg Subcutaneous Q24H  . folic acid  1 mg Oral Daily  . LORazepam  0-4 mg Intravenous Q4H   Followed by  . [START ON 01/02/2020] LORazepam  0-4 mg Intravenous Q8H  . multivitamin with minerals  1 tablet Oral Daily  . thiamine  100 mg Oral Daily   Or  . thiamine  100 mg Intravenous Daily   Continuous Infusions: . sodium chloride 125 mL/hr at 12/31/19 0712    Procedures/Studies: DG Chest 2 View  Result Date: 12/31/2019 CLINICAL DATA:  Hiccups for 1 week, emesis, weight loss, tobacco abuse EXAM: CHEST - 2 VIEW COMPARISON:  10/20/2019 FINDINGS: The heart size and mediastinal contours are within normal limits. Both lungs are clear. The visualized skeletal structures are unremarkable. IMPRESSION: No active cardiopulmonary disease. Electronically Signed   By: Sharlet Salina M.D.   On: 12/31/2019 02:00    Catarina Hartshorn, DO  [facility]  If 7PM-7AM, please contact night-coverage [email or web address] Password [email or web address] 12/31/2019, 9:58 AM   LOS: 0 days

## 2019-12-31 NOTE — ED Provider Notes (Addendum)
[facility] EMERGENCY DEPARTMENT Provider Note   CSN: [id number] Arrival date & time: 12/31/19  0008   Time seen 1:00 AM  History Chief Complaint  Patient presents with  . Hiccups    Caleb Kim is a 59 y.o. male.  HPI   Patient relates he has been having hiccups continuously since May 3.  He states sometimes they are so hard he has post hiccup vomiting.  He also relates a loss of appetite that started before the hiccuping started.  He denies chest pain, abdominal pain, or nausea.  He denies feeling short of breath.  He states sometimes he has a cough but he denied fever.  He states he has had a 10 pound weight loss in the last 1 to 2 weeks.  Patient states he smokes 3 cigars, black and milds, a day.  PCP Patient, No Pcp Per]  History reviewed. No pertinent past medical history.  Patient Active Problem List   Diagnosis Date Noted  . Hyponatremia 12/31/2019  . Hypokalemia 12/31/2019  . Alcoholic intoxication without complication (HCC) 12/31/2019  . Tobacco abuse 12/31/2019  . Vomiting 12/31/2019  . Intractable hiccups 12/31/2019  . Anorexia 12/31/2019    Past Surgical History:  Procedure Laterality Date  . EXPLORATORY LAPAROTOMY         No family history on file.  Social History   Tobacco Use  . Smoking status: Current Every Day Smoker    Types: Cigars  . Smokeless tobacco: Never Used  Substance Use Topics  . Alcohol use: Yes    Comment: 2-3 beers daily'  . Drug use: No  lives with spouse  Home Medications Prior to Admission medications   Medication Sig Start Date End Date Taking? Authorizing Provider  amoxicillin (AMOXIL) 500 MG capsule Take 1 capsule (500 mg total) by mouth 3 (three) times daily. 04/10/17   Eber Hong, MD  HYDROcodone-acetaminophen (NORCO/VICODIN) 5-325 MG tablet Take 2 tablets by mouth every 4 (four) hours as needed. 04/10/17   Eber Hong, MD  ibuprofen (ADVIL,MOTRIN) 800 MG tablet Take 1 tablet (800 mg total) by mouth 3 (three)  times daily. 04/10/17   Eber Hong, MD  Patient denies taking any medications regularly  Allergies    Patient has no known allergies.  Review of Systems   Review of Systems  All other systems reviewed and are negative.   Physical Exam Updated Vital Signs BP 115/81   Pulse (!) 104   Temp 99.1 F (37.3 C) (Oral)   Resp 17   Ht 5\' 6"  (1.676 m)   Wt 61.2 kg   SpO2 100%   BMI 21.78 kg/m   Physical Exam Vitals and nursing note reviewed.  Constitutional:      Appearance: Normal appearance.     Comments: Very thin male, in the middle of our conversation patient did start having hiccups.  HENT:     Head: Normocephalic and atraumatic.     Nose: Nose normal.     Mouth/Throat:     Mouth: Mucous membranes are dry.  Eyes:     Extraocular Movements: Extraocular movements intact.     Conjunctiva/sclera: Conjunctivae normal.     Pupils: Pupils are equal, round, and reactive to light.  Cardiovascular:     Rate and Rhythm: Normal rate and regular rhythm.  Pulmonary:     Effort: Pulmonary effort is normal. No respiratory distress.     Breath sounds: Normal breath sounds.  Abdominal:     General: Abdomen is flat. Bowel sounds  are normal.     Palpations: Abdomen is soft.     Tenderness: There is no abdominal tenderness.  Musculoskeletal:        General: Normal range of motion.     Cervical back: Normal range of motion.  Skin:    General: Skin is warm and dry.  Neurological:     General: No focal deficit present.     Mental Status: He is alert and oriented to person, place, and time. Mental status is at baseline.  Psychiatric:        Mood and Affect: Mood normal.        Behavior: Behavior normal.        Thought Content: Thought content normal.     ED Results / Procedures / Treatments   Labs (all labs ordered are listed, but only abnormal results are displayed) Results for orders placed or performed during the hospital encounter of 12/31/19  Respiratory Panel by RT PCR  (Flu A&B, Covid) - Nasopharyngeal Swab   Specimen: Nasopharyngeal Swab  Result Value Ref Range   SARS Coronavirus 2 by RT PCR NEGATIVE NEGATIVE   Influenza A by PCR NEGATIVE NEGATIVE   Influenza B by PCR NEGATIVE NEGATIVE  Comprehensive metabolic panel  Result Value Ref Range   Sodium 117 (LL) 135 - 145 mmol/L   Potassium 3.1 (L) 3.5 - 5.1 mmol/L   Chloride 73 (L) 98 - 111 mmol/L   CO2 28 22 - 32 mmol/L   Glucose, Bld 94 70 - 99 mg/dL   BUN 11 6 - 20 mg/dL   Creatinine, Ser 0.64 0.61 - 1.24 mg/dL   Calcium 9.2 8.9 - 10.3 mg/dL   Total Protein 8.8 (H) 6.5 - 8.1 g/dL   Albumin 4.4 3.5 - 5.0 g/dL   AST 86 (H) 15 - 41 U/L   ALT 70 (H) 0 - 44 U/L   Alkaline Phosphatase 89 38 - 126 U/L   Total Bilirubin 1.3 (H) 0.3 - 1.2 mg/dL   GFR calc non Af Amer >60 >60 mL/min   GFR calc Af Amer >60 >60 mL/min   Anion gap 16 (H) 5 - 15  CBC with Differential  Result Value Ref Range   WBC 8.5 4.0 - 10.5 K/uL   RBC 4.51 4.22 - 5.81 MIL/uL   Hemoglobin 13.8 13.0 - 17.0 g/dL   HCT 39.7 39.0 - 52.0 %   MCV 88.0 80.0 - 100.0 fL   MCH 30.6 26.0 - 34.0 pg   MCHC 34.8 30.0 - 36.0 g/dL   RDW 12.2 11.5 - 15.5 %   Platelets 144 (L) 150 - 400 K/uL   nRBC 0.0 0.0 - 0.2 %   Neutrophils Relative % 76 %   Neutro Abs 6.5 1.7 - 7.7 K/uL   Lymphocytes Relative 11 %   Lymphs Abs 0.9 0.7 - 4.0 K/uL   Monocytes Relative 12 %   Monocytes Absolute 1.0 0.1 - 1.0 K/uL   Eosinophils Relative 0 %   Eosinophils Absolute 0.0 0.0 - 0.5 K/uL   Basophils Relative 0 %   Basophils Absolute 0.0 0.0 - 0.1 K/uL   Immature Granulocytes 1 %   Abs Immature Granulocytes 0.05 0.00 - 0.07 K/uL  TSH  Result Value Ref Range   TSH 0.788 0.350 - 4.500 uIU/mL  Sodium, urine, random  Result Value Ref Range   Sodium, Ur <10 mmol/L  Urinalysis, Routine w reflex microscopic  Result Value Ref Range   Color, Urine YELLOW YELLOW  APPearance CLEAR CLEAR   Specific Gravity, Urine 1.008 1.005 - 1.030   pH 5.0 5.0 - 8.0   Glucose, UA  NEGATIVE NEGATIVE mg/dL   Hgb urine dipstick NEGATIVE NEGATIVE   Bilirubin Urine NEGATIVE NEGATIVE   Ketones, ur NEGATIVE NEGATIVE mg/dL   Protein, ur NEGATIVE NEGATIVE mg/dL   Nitrite NEGATIVE NEGATIVE   Leukocytes,Ua NEGATIVE NEGATIVE  Ethanol  Result Value Ref Range   Alcohol, Ethyl (B) 29 (H) <10 mg/dL  Magnesium  Result Value Ref Range   Magnesium 2.1 1.7 - 2.4 mg/dL  Comprehensive metabolic panel  Result Value Ref Range   Sodium 121 (L) 135 - 145 mmol/L   Potassium 4.4 3.5 - 5.1 mmol/L   Chloride 83 (L) 98 - 111 mmol/L   CO2 23 22 - 32 mmol/L   Glucose, Bld 82 70 - 99 mg/dL   BUN 9 6 - 20 mg/dL   Creatinine, Ser 1.54 0.61 - 1.24 mg/dL   Calcium 8.5 (L) 8.9 - 10.3 mg/dL   Total Protein 6.9 6.5 - 8.1 g/dL   Albumin 3.5 3.5 - 5.0 g/dL   AST 76 (H) 15 - 41 U/L   ALT 56 (H) 0 - 44 U/L   Alkaline Phosphatase 71 38 - 126 U/L   Total Bilirubin 1.9 (H) 0.3 - 1.2 mg/dL   GFR calc non Af Amer >60 >60 mL/min   GFR calc Af Amer >60 >60 mL/min   Anion gap 15 5 - 15  Magnesium  Result Value Ref Range   Magnesium 2.2 1.7 - 2.4 mg/dL  Phosphorus  Result Value Ref Range   Phosphorus 3.2 2.5 - 4.6 mg/dL   Laboratory interpretation all normal except hyponatremia, low chloride, hypokalemia, elevation of LFTs    EKG EKG Interpretation  Date/Time:  Sunday Dec 31 2019 03:52:18 EDT Ventricular Rate:  85 PR Interval:    QRS Duration: 89 QT Interval:  367 QTC Calculation: 437 R Axis:   55 Text Interpretation: Sinus rhythm Multiform ventricular premature complexes Borderline T abnormalities, inferior leads No significant change since last tracing 20 Oct 2019 Confirmed by Devoria Albe ([id number]) on 12/31/2019 4:07:23 AM   Radiology DG Chest 2 View  Result Date: 12/31/2019 CLINICAL DATA:  Hiccups for 1 week, emesis, weight loss, tobacco abuse EXAM: CHEST - 2 VIEW COMPARISON:  10/20/2019 FINDINGS: The heart size and mediastinal contours are within normal limits. Both lungs are clear. The  visualized skeletal structures are unremarkable. IMPRESSION: No active cardiopulmonary disease. Electronically Signed   By: Sharlet Salina M.D.   On: 12/31/2019 02:00    Procedures .Critical Care Performed by: Devoria Albe, MD Authorized by: Devoria Albe, MD   Critical care provider statement:    Critical care time (minutes):  32   Critical care was necessary to treat or prevent imminent or life-threatening deterioration of the following conditions:  Endocrine crisis   Critical care was time spent personally by me on the following activities:  Discussions with consultants, examination of patient, obtaining history from patient or surrogate, ordering and review of laboratory studies, ordering and review of radiographic studies and re-evaluation of patient's condition   (including critical care time)  Medications Ordered in ED Medications  chlorproMAZINE (THORAZINE) injection 25 mg (25 mg Intramuscular Given 12/31/19 0133)  sodium chloride 0.9 % bolus 1,000 mL (0 mLs Intravenous Stopped 12/31/19 0621)  potassium chloride SA (KLOR-CON) CR tablet 40 mEq (40 mEq Oral Given 12/31/19 6195)    ED Course  I have reviewed the triage  vital signs and the nursing notes.  Pertinent labs & imaging results that were available during my care of the patient were reviewed by me and considered in my medical decision making (see chart for details).    MDM Rules/Calculators/A&P                      Patient was given Thorazine 25 mg IM.  Chest x-ray and laboratory testing was done.  Due to his loss of appetite a TSH was also done.  2:55 AM patient is resting comfortably, he states his hiccups are gone.  Patient's laboratory tests have resulted, he has a pretty significant new hyponatremia and hypokalemia.  His chloride is very low.  I have talked to the patient and asked him to give [place] a urine sample so we can see what type of hyponatremia has but most likely it is from lack of dietary intake.  Patient states he has  not had a appetite for the last 1 to 2 weeks and has had a 10 pound weight loss.  He was given 1 L of normal saline.  Patient is awake and alert so 3% normal saline was not given at this time.  Magnesium level was added to his blood work as well as an alcohol level due to the elevated LFTs.  1:31 AM Dr. Welton Flakes, hospitalist will admit   Final Clinical Impression(s) / ED Diagnoses Final diagnoses:  Intractable hiccups  Hyponatremia  Hypokalemia  Weight loss    Rx / DC Orders   Plan admission  Devoria Albe, MD, Concha Pyo, MD 12/31/19 5726    Devoria Albe, MD 12/31/19 [phone]

## 2020-01-01 LAB — BASIC METABOLIC PANEL
Anion gap: 9 (ref 5–15)
Anion gap: 9 (ref 5–15)
Anion gap: 10 (ref 5–15)
Anion gap: 9 (ref 5–15)
BUN: 9 mg/dL (ref 6–20)
BUN: 10 mg/dL (ref 6–20)
BUN: 9 mg/dL (ref 6–20)
BUN: 9 mg/dL (ref 6–20)
CO2: 28 mmol/L (ref 22–32)
CO2: 25 mmol/L (ref 22–32)
CO2: 25 mmol/L (ref 22–32)
CO2: 26 mmol/L (ref 22–32)
Calcium: 8.5 mg/dL — ABNORMAL LOW (ref 8.9–10.3)
Calcium: 8.3 mg/dL — ABNORMAL LOW (ref 8.9–10.3)
Calcium: 8.4 mg/dL — ABNORMAL LOW (ref 8.9–10.3)
Calcium: 8.7 mg/dL — ABNORMAL LOW (ref 8.9–10.3)
Chloride: 91 mmol/L — ABNORMAL LOW (ref 98–111)
Chloride: 91 mmol/L — ABNORMAL LOW (ref 98–111)
Chloride: 92 mmol/L — ABNORMAL LOW (ref 98–111)
Chloride: 94 mmol/L — ABNORMAL LOW (ref 98–111)
Creatinine, Ser: 0.79 mg/dL (ref 0.61–1.24)
Creatinine, Ser: 0.73 mg/dL (ref 0.61–1.24)
Creatinine, Ser: 0.74 mg/dL (ref 0.61–1.24)
Creatinine, Ser: 0.62 mg/dL (ref 0.61–1.24)
GFR calc Af Amer: >60 mL/min (ref >60)
GFR calc Af Amer: >60 mL/min (ref >60)
GFR calc Af Amer: >60 mL/min (ref >60)
GFR calc Af Amer: >60 mL/min (ref >60)
GFR calc non Af Amer: >60 mL/min (ref >60)
GFR calc non Af Amer: >60 mL/min (ref >60)
GFR calc non Af Amer: >60 mL/min (ref >60)
GFR calc non Af Amer: >60 mL/min (ref >60)
Glucose, Bld: 136 mg/dL — ABNORMAL HIGH (ref 70–99)
Glucose, Bld: 112 mg/dL — ABNORMAL HIGH (ref 70–99)
Glucose, Bld: 134 mg/dL — ABNORMAL HIGH (ref 70–99)
Glucose, Bld: 105 mg/dL — ABNORMAL HIGH (ref 70–99)
Potassium: 3.3 mmol/L — ABNORMAL LOW (ref 3.5–5.1)
Potassium: 3.7 mmol/L (ref 3.5–5.1)
Potassium: 3.7 mmol/L (ref 3.5–5.1)
Potassium: 3.8 mmol/L (ref 3.5–5.1)
Sodium: 128 mmol/L — ABNORMAL LOW (ref 135–145)
Sodium: 125 mmol/L — ABNORMAL LOW (ref 135–145)
Sodium: 127 mmol/L — ABNORMAL LOW (ref 135–145)
Sodium: 129 mmol/L — ABNORMAL LOW (ref 135–145)

## 2020-01-01 LAB — CBC
HCT: 33.2 % — ABNORMAL LOW (ref 39.0–52.0)
Hemoglobin: 11.3 g/dL — ABNORMAL LOW (ref 13.0–17.0)
MCH: 30.9 pg (ref 26.0–34.0)
MCHC: 34 g/dL (ref 30.0–36.0)
MCV: 90.7 fL (ref 80.0–100.0)
Platelets: 111 10*3/uL — ABNORMAL LOW (ref 150–400)
RBC: 3.66 MIL/uL — ABNORMAL LOW (ref 4.22–5.81)
RDW: 12.3 % (ref 11.5–15.5)
WBC: 4.2 10*3/uL (ref 4.0–10.5)
nRBC: 0 % (ref 0.0–0.2)

## 2020-01-01 LAB — COMPREHENSIVE METABOLIC PANEL
ALT: 67 U/L — ABNORMAL HIGH (ref 0–44)
AST: 92 U/L — ABNORMAL HIGH (ref 15–41)
Albumin: 3.1 g/dL — ABNORMAL LOW (ref 3.5–5.0)
Alkaline Phosphatase: 64 U/L (ref 38–126)
Anion gap: 9 (ref 5–15)
BUN: 9 mg/dL (ref 6–20)
CO2: 28 mmol/L (ref 22–32)
Calcium: 8.7 mg/dL — ABNORMAL LOW (ref 8.9–10.3)
Chloride: 94 mmol/L — ABNORMAL LOW (ref 98–111)
Creatinine, Ser: 0.63 mg/dL (ref 0.61–1.24)
GFR calc Af Amer: >60 mL/min (ref >60)
GFR calc non Af Amer: >60 mL/min (ref >60)
Glucose, Bld: 92 mg/dL (ref 70–99)
Potassium: 3.3 mmol/L — ABNORMAL LOW (ref 3.5–5.1)
Sodium: 131 mmol/L — ABNORMAL LOW (ref 135–145)
Total Bilirubin: 1.4 mg/dL — ABNORMAL HIGH (ref 0.3–1.2)
Total Protein: 6.3 g/dL — ABNORMAL LOW (ref 6.5–8.1)

## 2020-01-01 LAB — HEPATITIS C ANTIBODY: HCV Ab: <0.1 s/co ratio — AB (ref 0.0–0.9)

## 2020-01-01 LAB — MAGNESIUM: Magnesium: 2 mg/dL (ref 1.7–2.4)

## 2020-01-01 MED ORDER — POTASSIUM CHLORIDE IN NACL 20-0.9 MEQ/L-% IV SOLN: INTRAVENOUS | Status: DC

## 2020-01-01 MED ORDER — POTASSIUM CHLORIDE CRYS ER 20 MEQ PO TBCR
20.0000 meq | EXTENDED_RELEASE_TABLET | Freq: Once | ORAL | Status: AC
  Administered 2020-01-01: 20 meq via ORAL
  Filled 2020-01-01: qty 1

## 2020-01-01 MED ORDER — POTASSIUM CL IN DEXTROSE 5% 20 MEQ/L IV SOLN
20.0000 meq | INTRAVENOUS | Status: DC
  Administered 2020-01-01: 20 meq via INTRAVENOUS

## 2020-01-01 NOTE — Discharge Summary (Signed)
Physician Discharge Summary  Caleb Kim IWL:[id number] DOB: 1961/04/04 DOA: 12/31/2019  PCP: Patient, No Pcp Per  Admit date: 12/31/2019 Discharge date: 01/03/2020  Admitted From: Home Disposition:  Home   Recommendations for Outpatient Follow-up:  1. Follow up with PCP in 1-2 weeks 2. Please obtain BMP/CBC in one week 3. Please follow up on the following pending results:  Home Health: YES Equipment/Devices: HHPT Discharge Condition: Stable  CODE STATUS:FULL Diet recommendation: Heart Healthy   Brief/Interim Summary: 59 year old male with a history of alcohol and tobacco abuse presented secondary to intractable hiccups. The patient states that he began hiccuping on 12/25/2019 with intractable nausea and vomiting resulting from his hiccups. The patient continues to drink 6 x 40 ozbeers on a daily basis. He has done so for approximately 10 years. He denies any illegal drugs. He smokes approximately 3 to 4 cigars on a daily basis. He denies any hematemesis, abdominal pain, diarrhea, hematochezia, melena. He does have abdominal discomfort when his hiccups recur. He has not had any fevers, chills, chest pain, hemoptysis, shortness of breath. He has had a cough with white sputum production. He has not had any dysuria or hematuria. He denies any new medications or over-the-counter medications. In the emergency department, the patient had low-grade temperature 9 9.1 F. He was hemodynamically stable with oxygen saturation 96 to 97% room air. Sodium was 117. Serum creatinine 0.64. AST 86, ALT 70, alkaline phosphatase 89, total bilirubin 1.3. WBC 8.5, hemoglobin 13.8, platelets 1 44,000. Because of his hyponatremia the patient was admitted for further evaluation.  His Na ultimately improved with NS infusion.  He had mild withdrawal which gradually improved with CIWA protocol.  Discharge Diagnoses:  Hyponatremia -Secondary to Potomania and volume depletion from his decreased oral  intake and vomiting -Monitor sodium every 4 hours -switch to D5W to prevent too rapid of Na correction -Urine osmolarity--107 -Serum osmolarity--263 -FeNa--0.21%  Alcohol abuse and alcohol withdrawal -Alcohol withdrawal protocol -Urine drug screen--neg -alcohol level 29 -Evening 01/01/2020--patient developed agitation and was not redirectable--started on Ativan withdrawal protocol -UA neg for pyuria -5/12-improved  Intractable hiccups and vomiting -CT abdomen and pelvis--severe hepatic steatosis, no ascites, normal pancreas, no ductal dilatation -As needed Thorazine -Personally reviewed chest x-ray--no infiltrates or edema -initially npo with bowel rest -diet advanced which he has tolerated  Upper Airway Cough Syndrome -this may be inciting his hiccoughs -jolly ranchers -hycodan  Tobacco abuse -Tobacco cessation discussed  Transaminasemia -Hepatitis B surface antigen--neg -Hepatitis C antibody--neg -Right upper quadrant ultrasound--diffuse liver echogenicity  Thrombocytopenia -Secondary to the patient's alcohol abuse -R74--0814 -Folic GYJE--5.6 -DJS--9.702      Discharge Instructions  Discharge Instructions    Ambulatory referral to Physical Therapy   Complete by: As directed      Allergies as of 01/03/2020   No Known Allergies     Medication List    STOP taking these medications   amoxicillin 500 MG capsule Commonly known as: AMOXIL   HYDROcodone-acetaminophen 5-325 MG tablet Commonly known as: NORCO/VICODIN   ibuprofen 800 MG tablet Commonly known as: ADVIL       No Known Allergies  Consultations:  none   Procedures/Studies: DG Chest 2 View  Result Date: 12/31/2019 CLINICAL DATA:  Hiccups for 1 week, emesis, weight loss, tobacco abuse EXAM: CHEST - 2 VIEW COMPARISON:  10/20/2019 FINDINGS: The heart size and mediastinal contours are within normal limits. Both lungs are clear. The visualized skeletal structures are unremarkable.  IMPRESSION: No active cardiopulmonary disease. Electronically Signed   By: Legrand Como  Manson PasseyBrown M.D.   On: 12/31/2019 02:00   CT ABDOMEN PELVIS W CONTRAST  Result Date: 12/31/2019 CLINICAL DATA:  Intractable hiccups.  Nausea and vomiting. EXAM: CT ABDOMEN AND PELVIS WITH CONTRAST TECHNIQUE: Multidetector CT imaging of the abdomen and pelvis was performed using the standard protocol following bolus administration of intravenous contrast. CONTRAST:  80mL OMNIPAQUE IOHEXOL 300 MG/ML  SOLN COMPARISON:  None. FINDINGS: Lower chest: Lung bases are clear. Hepatobiliary: Diffuse low-attenuation liver. No biliary duct dilatation. Gallbladder normal. Common bile duct normal at 5 mm (image 39/5). Portal veins patent. Pancreas: Pancreas is normal. No ductal dilatation. No pancreatic inflammation. Spleen: Normal spleen Adrenals/urinary tract: Adrenal glands and kidneys are normal. The ureters and bladder normal. Bladder is distended. Stomach/Bowel: Small hiatal hernia. Stomach, duodenum small-bowel normal. Appendix not identified. The colon and rectosigmoid colon are normal. Vascular/Lymphatic: Abdominal aorta is normal caliber with atherosclerotic calcification. There is no retroperitoneal or periportal lymphadenopathy. No pelvic lymphadenopathy. Reproductive: Prostate normal Other: No free fluid. Musculoskeletal: No aggressive osseous lesion. IMPRESSION: 1. Severe hepatic steatosis. 2. No biliary duct dilatation. 3. Normal pancreas. 4. No ascites. 5. Small hiatal hernia. Electronically Signed   By: Genevive BiStewart  Edmunds M.D.   On: 12/31/2019 11:01   US Abdomen Limited RUQ  Result Date: 12/31/2019 CLINICAL DATA:  Elevated liver enzymes EXAM: ULTRASOUND ABDOMEN LIMITED RIGHT UPPER QUADRANT COMPARISON:  CT abdomen and pelvis Dec 31, 2019 FINDINGS: Gallbladder: No gallstones or wall thickening visualized. There is no pericholecystic fluid. No sonographic Murphy sign noted by sonographer. Common bile duct: Diameter: 4 mm. No intrahepatic  or extrahepatic biliary duct dilatation. Liver: No focal lesion identified. Liver echogenicity is increased diffusely. Portal vein is patent on color Doppler imaging with normal direction of blood flow towards the liver. Other: None. IMPRESSION: Diffuse increase in liver echogenicity, a finding indicative of hepatic steatosis. No focal liver lesions are evident; it must be cautioned that the sensitivity of ultrasound for detection of focal liver lesions is diminished in this circumstance. Study otherwise unremarkable. Electronically Signed   By: Bretta BangWilliam  Woodruff III M.D.   On: 12/31/2019 11:46        Discharge Exam: Vitals:   01/02/20 2239 01/03/20 0547  BP: (!) 161/92 135/90  Pulse: (!) 59 66  Resp:  18  Temp: 99.1 F (37.3 C) 99 F (37.2 C)  SpO2: 99% 100%   Vitals:   01/02/20 1658 01/02/20 2115 01/02/20 2239 01/03/20 0547  BP: (!) 163/105  (!) 161/92 135/90  Pulse: 73  (!) 59 66  Resp: 18   18  Temp: 97.8 F (36.6 C)  99.1 F (37.3 C) 99 F (37.2 C)  TempSrc: Oral  Oral Oral  SpO2: 99% 99% 99% 100%  Weight:      Height:        General: Pt is alert, awake, not in acute distress Cardiovascular: RRR, S1/S2 +, no rubs, no gallops Respiratory: diminished BS,  Bibasilar rales. No wheeze Abdominal: Soft, NT, ND, bowel sounds + Extremities: no edema, no cyanosis   The results of significant diagnostics from this hospitalization (including imaging, microbiology, ancillary and laboratory) are listed below for reference.    Significant Diagnostic Studies: DG Chest 2 View  Result Date: 12/31/2019 CLINICAL DATA:  Hiccups for 1 week, emesis, weight loss, tobacco abuse EXAM: CHEST - 2 VIEW COMPARISON:  10/20/2019 FINDINGS: The heart size and mediastinal contours are within normal limits. Both lungs are clear. The visualized skeletal structures are unremarkable. IMPRESSION: No active cardiopulmonary disease. Electronically Signed  By: Sharlet Salina M.D.   On: 12/31/2019 02:00   CT  ABDOMEN PELVIS W CONTRAST  Result Date: 12/31/2019 CLINICAL DATA:  Intractable hiccups.  Nausea and vomiting. EXAM: CT ABDOMEN AND PELVIS WITH CONTRAST TECHNIQUE: Multidetector CT imaging of the abdomen and pelvis was performed using the standard protocol following bolus administration of intravenous contrast. CONTRAST:  94mL OMNIPAQUE IOHEXOL 300 MG/ML  SOLN COMPARISON:  None. FINDINGS: Lower chest: Lung bases are clear. Hepatobiliary: Diffuse low-attenuation liver. No biliary duct dilatation. Gallbladder normal. Common bile duct normal at 5 mm (image 39/5). Portal veins patent. Pancreas: Pancreas is normal. No ductal dilatation. No pancreatic inflammation. Spleen: Normal spleen Adrenals/urinary tract: Adrenal glands and kidneys are normal. The ureters and bladder normal. Bladder is distended. Stomach/Bowel: Small hiatal hernia. Stomach, duodenum small-bowel normal. Appendix not identified. The colon and rectosigmoid colon are normal. Vascular/Lymphatic: Abdominal aorta is normal caliber with atherosclerotic calcification. There is no retroperitoneal or periportal lymphadenopathy. No pelvic lymphadenopathy. Reproductive: Prostate normal Other: No free fluid. Musculoskeletal: No aggressive osseous lesion. IMPRESSION: 1. Severe hepatic steatosis. 2. No biliary duct dilatation. 3. Normal pancreas. 4. No ascites. 5. Small hiatal hernia. Electronically Signed   By: Genevive Bi M.D.   On: 12/31/2019 11:01   US Abdomen Limited RUQ  Result Date: 12/31/2019 CLINICAL DATA:  Elevated liver enzymes EXAM: ULTRASOUND ABDOMEN LIMITED RIGHT UPPER QUADRANT COMPARISON:  CT abdomen and pelvis Dec 31, 2019 FINDINGS: Gallbladder: No gallstones or wall thickening visualized. There is no pericholecystic fluid. No sonographic Murphy sign noted by sonographer. Common bile duct: Diameter: 4 mm. No intrahepatic or extrahepatic biliary duct dilatation. Liver: No focal lesion identified. Liver echogenicity is increased diffusely.  Portal vein is patent on color Doppler imaging with normal direction of blood flow towards the liver. Other: None. IMPRESSION: Diffuse increase in liver echogenicity, a finding indicative of hepatic steatosis. No focal liver lesions are evident; it must be cautioned that the sensitivity of ultrasound for detection of focal liver lesions is diminished in this circumstance. Study otherwise unremarkable. Electronically Signed   By: Bretta Bang III M.D.   On: 12/31/2019 11:46     Microbiology: Recent Results (from the past 240 hour(s))  Respiratory Panel by RT PCR (Flu A&B, Covid) - Nasopharyngeal Swab     Status: None   Collection Time: 12/31/19  3:39 AM   Specimen: Nasopharyngeal Swab  Result Value Ref Range Status   SARS Coronavirus 2 by RT PCR NEGATIVE NEGATIVE Final    Comment: (NOTE) SARS-CoV-2 target nucleic acids are NOT DETECTED. The SARS-CoV-2 RNA is generally detectable in upper respiratoy specimens during the acute phase of infection. The lowest concentration of SARS-CoV-2 viral copies this assay can detect is 131 copies/mL. A negative result does not preclude SARS-Cov-2 infection and should not be used as the sole basis for treatment or other patient management decisions. A negative result may occur with  improper specimen collection/handling, submission of specimen other than nasopharyngeal swab, presence of viral mutation(s) within the areas targeted by this assay, and inadequate number of viral copies (<131 copies/mL). A negative result must be combined with clinical observations, patient history, and epidemiological information. The expected result is Negative. Fact Sheet for Patients:  https://www.moore.com/ Fact Sheet for Healthcare Providers:  https://www.young.biz/ This test is not yet ap proved or cleared by the [place] FDA and  has been authorized for detection and/or diagnosis of SARS-CoV-2 by FDA under an Emergency  Use Authorization (EUA). This EUA will remain  in effect (meaning this  test can be used) for the duration of the COVID-19 declaration under Section 564(b)(1) of the Act, 21 U.S.C. section 360bbb-3(b)(1), unless the authorization is terminated or revoked sooner.    Influenza A by PCR NEGATIVE NEGATIVE Final   Influenza B by PCR NEGATIVE NEGATIVE Final    Comment: (NOTE) The Xpert Xpress SARS-CoV-2/FLU/RSV assay is intended as an aid in  the diagnosis of influenza from Nasopharyngeal swab specimens and  should not be used as a sole basis for treatment. Nasal washings and  aspirates are unacceptable for Xpert Xpress SARS-CoV-2/FLU/RSV  testing. Fact Sheet for Patients: https://www.moore.com/ Fact Sheet for Healthcare Providers: https://www.young.biz/ This test is not yet approved or cleared by the [place] FDA and  has been authorized for detection and/or diagnosis of SARS-CoV-2 by  FDA under an Emergency Use Authorization (EUA). This EUA will remain  in effect (meaning this test can be used) for the duration of the  Covid-19 declaration under Section 564(b)(1) of the Act, 21  U.S.C. section 360bbb-3(b)(1), unless the authorization is  terminated or revoked. Performed at [facility], [address] [place], [place] [address]   MRSA PCR Screening     Status: None   Collection Time: 12/31/19 11:52 AM   Specimen: Nasopharyngeal  Result Value Ref Range Status   MRSA by PCR NEGATIVE NEGATIVE Final    Comment:        The GeneXpert MRSA Assay (FDA approved for NASAL specimens only), is one component of a comprehensive MRSA colonization surveillance program. It is not intended to diagnose MRSA infection nor to guide or monitor treatment for MRSA infections. Performed at [facility], [address], [place], [place] [address]      Labs: Basic Metabolic Panel: Recent Labs  Lab 12/31/19 0207 12/31/19 0207 12/31/19 0511 12/31/19 1055  01/01/20 0413 01/01/20 0413 01/01/20 0931 01/01/20 0931 01/01/20 1305 01/01/20 1305 01/01/20 1652 01/01/20 1652 01/01/20 2057 01/02/20 0447  NA 117*   < > 121*   < > 131*   < > 128*  --  125*  --  127*  --  129* 132*  K 3.1*   < > 4.4   < > 3.3*   < > 3.3*   < > 3.7   < > 3.7   < > 3.8 3.8  CL 73*   < > 83*   < > 94*   < > 91*  --  91*  --  92*  --  94* 97*  CO2 28   < > 23   < > 28   < > 28  --  25  --  25  --  26 26  GLUCOSE 94   < > 82   < > 92   < > 136*  --  112*  --  134*  --  105* 97  BUN 11   < > 9   < > 9   < > 9  --  10  --  9  --  9 7  CREATININE 0.64   < > 0.64   < > 0.63   < > 0.79  --  0.73  --  0.74  --  0.62 0.54*  CALCIUM 9.2   < > 8.5*   < > 8.7*   < > 8.5*  --  8.3*  --  8.4*  --  8.7* 8.7*  MG 2.1  --  2.2  --  2.0  --   --   --   --   --   --   --   --   --  PHOS  --   --  3.2  --   --   --   --   --   --   --   --   --   --   --    < > = values in this interval not displayed.   Liver Function Tests: Recent Labs  Lab 12/31/19 0207 12/31/19 0511 01/01/20 0413  AST 86* 76* 92*  ALT 70* 56* 67*  ALKPHOS 89 71 64  BILITOT 1.3* 1.9* 1.4*  PROT 8.8* 6.9 6.3*  ALBUMIN 4.4 3.5 3.1*   No results for input(s): LIPASE, AMYLASE in the last 168 hours. No results for input(s): AMMONIA in the last 168 hours. CBC: Recent Labs  Lab 12/31/19 0207 12/31/19 0511 01/01/20 0413 01/02/20 0447  WBC 8.5 7.3 4.2 4.8  NEUTROABS 6.5  --   --   --   HGB 13.8 12.4* 11.3* 10.6*  HCT 39.7 35.9* 33.2* 31.9*  MCV 88.0 87.1 90.7 92.5  PLT 144* 108* 111* 138*   Cardiac Enzymes: No results for input(s): CKTOTAL, CKMB, CKMBINDEX, TROPONINI in the last 168 hours. BNP: Invalid input(s): POCBNP CBG: No results for input(s): GLUCAP in the last 168 hours.  Time coordinating discharge:  36 minutes  Signed:  Catarina Hartshorn, DO [facility] Pager: [phone] 01/03/2020, 12:55 PM

## 2020-01-01 NOTE — Progress Notes (Signed)
Initial Nutrition Assessment  DOCUMENTATION CODES:   Underweight  INTERVENTION:  Continue Ensure Enlive po BID, each supplement provides 350 kcal and 20 grams of protein  Monitor magnesium, potassium, and phosphorus daily for at least 3 days, MD to replete as needed, as pt is at risk for refeeding syndrome given hypokalemia per labs, history of daily alcohol intake, decreased oral intake pta  NUTRITION DIAGNOSIS:   Inadequate oral intake related to (hiccups with intractable nausea and vomiting) as evidenced by per patient/family report.    GOAL:   Patient will meet greater than or equal to 90% of their needs    MONITOR:   Weight trends, Labs, I & O's, Supplement acceptance, PO intake  REASON FOR ASSESSMENT:   Malnutrition Screening Tool    ASSESSMENT:  59 year old male with past medical history of alcohol and tobacco abuse presented secondary to hiccups that began on 12/25/19 with intractable nausea and vomiting admitted for hyponatremia secondary to potomania and volume depletion from decreased oral intake and vomiting.  Per chart review, patient reports daily alcohol consumption, recalling 6 x 40 oz beers over the past 10 years. Blood alcohol level 29 on admission.   Patient consuming 50-75% x 2 documented meals this admission on a regular diet. Patient sitting up and eating lunch, wife at bedside reports improving appetite and intake since admission. Patient endorses decreased intake and ~10 lb wt loss over the past couple of weeks secondary to hiccups and episodes of vomiting. He reports improvement to symptoms with medication management, denies any chewing/swallowing difficulties. Patient is provided Ensure BID per medication review, pt endorses drinking, noted opened Ensure on bedside table.   I/Os: +1268 ml since admit     +268 ml x 24 hrs UOP: 1100 ml x 24 hrs  Current wt 116.38 lbs Per history, on 10/20/19 he weighed 134.64 lbs. No other recent weight history,  patient noted to have weighed the same 134.64 lbs in 03/2017. This indicates a 18.26 lb (13.6%) wt loss in 2.5 months which is significant, however unsure if recent 10/20/19 wt was a stated wt from previous history.  Medications reviewed and include: Folic acid, Ativan, MVI, B1 IVF: D5 with KCL 20 mEq/l @ 75 ml/hr Labs: Na 128 (L), K 3.3 (L), Mg 2 (WNL)  NUTRITION - FOCUSED PHYSICAL EXAM: Unable to complete at this time, patient eating lunch  Diet Order:   Diet Order            Diet regular Room service appropriate? Yes; Fluid consistency: Thin  Diet effective now              EDUCATION NEEDS:   No education needs have been identified at this time  Skin:  Skin Assessment: Reviewed RN Assessment  Last BM:  5/09  Height:   Ht Readings from Last 1 Encounters:  12/31/19 5\' 8"  (1.727 m)    Weight:   Wt Readings from Last 1 Encounters:  12/31/19 52.9 kg    BMI:  Body mass index is 17.73 kg/m.  Estimated Nutritional Needs:   Kcal:  1600-1850 (30-35 kcal/kg)  Protein:  80-93  Fluid:  >/= 1.6 L.day   03/01/20, RD, LDN Clinical Nutrition After Hours/Weekend Pager # in Amion

## 2020-01-01 NOTE — TOC Initial Note (Signed)
Transition of Care [facility]) - Initial/Assessment Note   Patient Details  Name: Caleb Kim MRN: [id number] Date of Birth: 1961-07-18  Transition of [facility]) CM/SW Contact:    Caleb Don, LCSW Phone Number: 01/01/2020, 4:09 PM  Clinical Narrative: Patient is a 59 year old male admitted to the hospital for hyponatremia. TOC asked to assist with patient having PCP/insurance needs as well as possible substance use resources. CSW met with patient to discuss [facility]. Patient provided verbal consent for CSW to make [facility] referral and CSW provided patient with [facility] patient packet. Patient declined substance use resources at this time. CSW called [facility] to make referral.              Expected Discharge Plan: Home/Self Care Barriers to Discharge: Continued Medical Work up  Expected Discharge Plan and Services Expected Discharge Plan: Home/Self Care In-house Referral: Clinical Social Work Living arrangements for the past 2 months: Single Family Home  Prior Living Arrangements/Services Living arrangements for the past 2 months: Single Family Home Lives with:: Self Patient language and need for interpreter reviewed:: Yes Do you feel safe going back to the place where you live?: Yes      Need for Family Participation in Patient Care: No (Comment) Care giver support system in place?: Yes (comment)(Caleb Kim (mother) PH: [phone])   Criminal Activity/Legal Involvement Pertinent to Current Situation/Hospitalization: No - Comment as needed  Activities of Daily Living Home Assistive Devices/Equipment: None ADL Screening (condition at time of admission) Patient's cognitive ability adequate to safely complete daily activities?: Yes Is the patient deaf or have difficulty hearing?: No Does the patient have difficulty seeing, even when wearing glasses/contacts?: No Does the patient have difficulty concentrating, remembering, or making decisions?: No Patient able to  express need for assistance with ADLs?: Yes Does the patient have difficulty dressing or bathing?: No Independently performs ADLs?: Yes (appropriate for developmental age) Does the patient have difficulty walking or climbing stairs?: No Weakness of Legs: None Weakness of Arms/Hands: None  Emotional Assessment Appearance:: Appears stated age Attitude/Demeanor/Rapport: Engaged Affect (typically observed): Accepting Orientation: : Oriented to Self, Oriented to Place, Oriented to  Time, Oriented to Situation Alcohol / Substance Use: Tobacco Use, Alcohol Use Psych Involvement: No (comment)  Admission diagnosis:  Hypokalemia [E87.6] Hyponatremia [E87.1] Weight loss [R63.4] Intractable hiccups [R06.6] Transaminasemia [R74.01] Patient Active Problem List   Diagnosis Date Noted  . Hyponatremia 12/31/2019  . Hypokalemia 12/31/2019  . Alcoholic intoxication without complication (Leslie) 97/58/8325  . Tobacco abuse 12/31/2019  . Vomiting 12/31/2019  . Intractable hiccups 12/31/2019  . Anorexia 12/31/2019  . Transaminasemia    PCP:  Patient, No Pcp Per Pharmacy:   [facility], [place] [address] [place] [address] Phone: [phone] Fax: [phone]  Readmission Risk Interventions No flowsheet data found.

## 2020-01-01 NOTE — Evaluation (Signed)
Physical Therapy Evaluation Patient Details Name: Caleb Kim MRN: [id number] DOB: 1961/05/17 Today's Date: 01/01/2020   History of Present Illness  59 year old male with a history of alcohol and tobacco abuse presented secondary to intractable hiccups.  The patient states that he began hiccuping on 12/25/2019 with intractable nausea and vomiting resulting from his hiccups.  The patient continues to drink 6 x 40 oz beers on a daily basis.  He has done so for approximately 10 years.  He denies any illegal drugs.  He smokes approximately 3 to 4 cigars on a daily basis.  He denies any hematemesis, abdominal pain, diarrhea, hematochezia, melena.  He does have abdominal discomfort when his hiccups recur.  He has not had any fevers, chills, chest pain, hemoptysis, shortness of breath.  He has had a cough with white sputum production.  He has not had any dysuria or hematuria.  He denies any new medications or over-the-counter medications.    Clinical Impression  Pt admitted with above diagnosis. Patient somewhat drowsy during PT evaluation due to Ativan approximately 1 hour before evaluation. Wife present in room throughout evaluation. Patient required supervision for bed mobility due to decreased safety awareness. Patient required min guard to min assist for transfers and ambulation and did loose balance posteriorly upon standing from bed. Patient required verbal cues for sequencing of steps and placement of hands for transfers and to walk within RW base of support and how to make turns during ambulation. Patient typically would stand to shower so would benefit from a shower chair/3-in-1 for safety at this time. Pt currently with functional limitations due to the deficits listed below (see PT Problem List). Pt will benefit from skilled PT to increase their independence and safety with mobility to allow discharge to the venue listed below.       Follow Up Recommendations Home health PT;Supervision for  mobility/OOB;Supervision/Assistance - 24 hour    Equipment Recommendations  3in1 (PT) (or shower chair if appropriate)   Recommendations for Other Services       Precautions / Restrictions Precautions Precautions: Fall Restrictions Weight Bearing Restrictions: No      Mobility  Bed Mobility Overal bed mobility: Needs Assistance Bed Mobility: Rolling;Sidelying to Sit;Sit to Supine Rolling: Supervision Sidelying to sit: Supervision   Sit to supine: Supervision      Transfers Overall transfer level: Needs assistance Equipment used: Rolling walker (2 wheeled) Transfers: Sit to/from [employer] Sit to Stand: Min guard;Min assist Stand pivot transfers: Min guard       General transfer comment: posterior loss of balance upon standing  Ambulation/Gait Ambulation/Gait assistance: Min guard Gait Distance (Feet): 250 Feet Assistive device: Rolling walker (2 wheeled) Gait Pattern/deviations: Step-through pattern;Decreased step length - right;Decreased step length - left;Decreased stride length;Trunk flexed Gait velocity: decreased   General Gait Details: slow, labored gait, cues to walk within base of support and to make turns, limited by fatigue, complaints of decreased balance during ambulation but not dizziness.  Stairs            Wheelchair Mobility    Modified Rankin (Stroke Patients Only)       Balance Overall balance assessment: Needs assistance Sitting-balance support: Bilateral upper extremity supported;Feet supported Sitting balance-Leahy Scale: Fair     Standing balance support: Bilateral upper extremity supported;During functional activity Standing balance-Leahy Scale: Fair Standing balance comment: with RW  Pertinent Vitals/Pain Pain Assessment: No/denies pain    Home Living Family/patient expects to be discharged to:: Private residence Living Arrangements: Spouse/significant  other Available Help at Discharge: Family;Available 24 hours/day(wife does work but is willing to stay home if needed.) Type of Home: Mobile home Home Access: Stairs to enter Entrance Stairs-Rails: None Entrance Stairs-Number of Steps: 3 Home Layout: One level Home Equipment: Walker - 2 wheels      Prior Function Level of Independence: Independent         Comments: wife performed IADLs but patient could perform if needed; patient still drove.     Hand Dominance   Dominant Hand: Right    Extremity/Trunk Assessment   Upper Extremity Assessment Upper Extremity Assessment: Generalized weakness    Lower Extremity Assessment Lower Extremity Assessment: Generalized weakness    Cervical / Trunk Assessment Cervical / Trunk Assessment: Normal  Communication   Communication: No difficulties  Cognition Arousal/Alertness: Awake/alert Behavior During Therapy: WFL for tasks assessed/performed Overall Cognitive Status: Impaired/Different from baseline(Patient just received Ativan prior to PT arrival so patient drowsy.)                                        General Comments      Exercises     Assessment/Plan    PT Assessment Patient needs continued PT services  PT Problem List Decreased strength;Decreased mobility;Decreased safety awareness;Decreased activity tolerance;Decreased balance;Decreased knowledge of use of DME;Decreased cognition       PT Treatment Interventions DME instruction;Therapeutic activities;Gait training;Therapeutic exercise;Patient/family education;Balance training;Stair training    PT Goals (Current goals can be found in the Care Plan section)  Acute Rehab PT Goals Patient Stated Goal: Go home PT Goal Formulation: With patient/family Time For Goal Achievement: 01/15/20 Potential to Achieve Goals: Good    Frequency Min 3X/week   Barriers to discharge        Co-evaluation               AM-PAC PT "6 Clicks" Mobility   Outcome Measure Help needed turning from your back to your side while in a flat bed without using bedrails?: A Little Help needed moving from lying on your back to sitting on the side of a flat bed without using bedrails?: A Little Help needed moving to and from a bed to a chair (including a wheelchair)?: A Little Help needed standing up from a chair using your arms (e.g., wheelchair or bedside chair)?: A Little Help needed to walk in hospital room?: A Little Help needed climbing 3-5 steps with a railing? : A Little 6 Click Score: 18    End of Session Equipment Utilized During Treatment: Gait belt Activity Tolerance: Patient tolerated treatment well;Patient limited by fatigue Patient left: in bed;with family/visitor present;with nursing/sitter in room Nurse Communication: Mobility status PT Visit Diagnosis: Unsteadiness on feet (R26.81);Other abnormalities of gait and mobility (R26.89);Muscle weakness (generalized) (M62.81)    Time: 1330-1400 PT Time Calculation (min) (ACUTE ONLY): 30 min   Charges:   PT Evaluation $PT Eval Low Complexity: 1 Low PT Treatments $Gait Training: 8-22 mins        Floria Raveling. Hartnett-Rands, MS, PT Per [place] [phone] 01/01/2020, 2:11 PM

## 2020-01-01 NOTE — Plan of Care (Signed)
  Problem: Acute Rehab PT Goals(only PT should resolve) Goal: Pt will Roll Supine to Side Outcome: Progressing Flowsheets (Taken 01/01/2020 1416) Pt will Roll Supine to Side: with supervision Goal: Pt Will Go Supine/Side To Sit Outcome: Progressing Flowsheets (Taken 01/01/2020 1416) Pt will go Supine/Side to Sit: with supervision Goal: Pt Will Go Sit To Supine/Side Outcome: Progressing Flowsheets (Taken 01/01/2020 1416) Pt will go Sit to Supine/Side: with supervision Goal: Patient Will Transfer Sit To/From Stand Outcome: Progressing Flowsheets (Taken 01/01/2020 1416) Patient will transfer sit to/from stand: with supervision Goal: Pt Will Transfer Bed To Chair/Chair To Bed Outcome: Progressing Flowsheets (Taken 01/01/2020 1416) Pt will Transfer Bed to Chair/Chair to Bed: with supervision Goal: Pt Will Ambulate Outcome: Progressing Flowsheets (Taken 01/01/2020 1416) Pt will Ambulate:  > 125 feet  with min guard assist  with least restrictive assistive device Goal: Pt Will Go Up/Down Stairs Outcome: Progressing Flowsheets (Taken 01/01/2020 1416) Pt will Go Up / Down Stairs:  with min guard assist  3-5 stairs  with least restrictive assistive device   Britta Mccreedy D. Hartnett-Rands, MS, PT Per [facility] [phone] 01/01/2020

## 2020-01-01 NOTE — Progress Notes (Signed)
Pts bed alarm going off- pt trying to get OOB and walk in room. When RN entered room, pt was stumbling and almost fell. Pt stated he had to use the restroom, RN gave patient urinal and pt placed back in bed. Bed alarm put back on, call bell within reach and bed in lowest locked position. Will continue to monitor pt

## 2020-01-01 NOTE — Progress Notes (Signed)
Pts bed alarm going off, pt trying to get OOB and states he is going home. Reoriented patient that he is in hospital and he has no way to get home at this time. RN spoke to patients gf who was adv of what was going on. Pts CIWA 12-14 this shift so far, d/t tremors, headache and pt not knowing where he is. Scheduled Ativan given per MD order. Patient has voided all on sheets, gown and full linen changed. Will continue to monitor pt

## 2020-01-01 NOTE — Progress Notes (Signed)
PROGRESS NOTE  Caleb Kim TGG:[id number] DOB: 1961/07/03 DOA: 12/31/2019 PCP: Patient, No Pcp Per  Brief History:  59 year old male with a history of alcohol and tobacco abuse presented secondary to intractable hiccups.  The patient states that he began hiccuping on 12/25/2019 with intractable nausea and vomiting resulting from his hiccups.  The patient continues to drink 6 x 40 oz beers on a daily basis.  He has done so for approximately 10 years.  He denies any illegal drugs.  He smokes approximately 3 to 4 cigars on a daily basis.  He denies any hematemesis, abdominal pain, diarrhea, hematochezia, melena.  He does have abdominal discomfort when his hiccups recur.  He has not had any fevers, chills, chest pain, hemoptysis, shortness of breath.  He has had a cough with white sputum production.  He has not had any dysuria or hematuria.  He denies any new medications or over-the-counter medications. In the emergency department, the patient had low-grade temperature 9 9.1 F.  He was hemodynamically stable with oxygen saturation 96 to 97% room air.  Sodium was 117.  Serum creatinine 0.64.  AST 86, ALT 70, alkaline phosphatase 89, total bilirubin 1.3.  WBC 8.5, hemoglobin 13.8, platelets 1 44,000.  Because of his hyponatremia the patient was admitted for further evaluation.  Assessment/Plan: Hyponatremia -Secondary to [place] and volume depletion from his decreased oral intake and vomiting -Monitor sodium every 4 hours -switch to D5W to prevent too rapid of Na correction -Urine osmolarity--107 -Serum osmolarity--263 -FeNa--0.21%  Alcohol abuse -Alcohol withdrawal protocol -Urine drug screen--neg -alcohol level 29  Intractable hiccups and vomiting -CT abdomen and pelvis--severe hepatic steatosis, no ascites, normal pancreas, no ductal dilatation -As needed Thorazine -Personally reviewed chest x-ray--no infiltrates or edema  Upper Airway Cough Syndrome -this may be inciting  his hiccoughs -jolly ranchers -hycodan  Tobacco abuse -Tobacco cessation discussed  Transaminasemia -Hepatitis B surface antigen--pending -Hepatitis C antibody--pending -Right upper quadrant ultrasound--diffuse liver echogenicity  Thrombocytopenia -Secondary to the patient's alcohol abuse -B12--1038 -Folic acid--6.7 -TSH--0.788 -A.m. CBC    Disposition Plan: Patient From: Hom D/C Place: Home - 1-2  Days Barriers: Not Clinically Stable--intractable hiccoughs , unstable Na  Family Communication:   Significant other at bedside updated 5/10  Consultants:  none  Code Status:  FULL   DVT Prophylaxis:  [place] Lovenox   Total time spent 35 minutes.  Greater than 50% spent face to face counseling and coordinating care.    Procedures: As Listed in Progress Note Above  Antibiotics: None  Subjective: Patient feels like he has some generalized weakness.  His hiccups are 40% better.  He still has a nonproductive cough.  He denies any fevers, chills, chest pain, shortness breath, nausea, vomiting, diarrhea.  There is no abdominal pain.  Objective: Vitals:   01/01/20 0500 01/01/20 0600 01/01/20 0700 01/01/20 0800  BP: 97/71 104/80 103/75 109/81  Pulse: 93 93 83 85  Resp: 17 15 15 18   Temp:    98.3 F (36.8 C)  TempSrc:    Oral  SpO2: 96% 99% 95% 97%  Weight:      Height:        Intake/Output Summary (Last 24 hours) at 01/01/2020 0900 Last data filed at 01/01/2020 0844 Gross per 24 hour  Intake 1608.53 ml  Output 1100 ml  Net 508.53 ml   Weight change: -8.3 kg Exam:   General:  Pt is alert, follows commands appropriately, not in acute distress  HEENT:  No icterus, No thrush, No neck mass, /AT  Cardiovascular: RRR, S1/S2, no rubs, no gallops  Respiratory: Bibasilar rales peer diminished breath is bilateral.  Abdomen: Soft/+BS, non tender, non distended, no guarding  Extremities: No edema, No lymphangitis, No petechiae, No rashes, no  synovitis   Data Reviewed: I have personally reviewed following labs and imaging studies Basic Metabolic Panel: Recent Labs  Lab 12/31/19 0207 12/31/19 0511 12/31/19 1055 12/31/19 1433 01/01/20 0413  NA 117* 121* 123* 126* 131*  K 3.1* 4.4 3.7 3.9 3.3*  CL 73* 83* 86* 89* 94*  CO2 28 23 25 25 28   GLUCOSE 94 82 114* 99 92  BUN 11 9 9 10 9   CREATININE 0.64 0.64 0.74 0.73 0.63  CALCIUM 9.2 8.5* 8.6* 8.5* 8.7*  MG 2.1 2.2  --   --  2.0  PHOS  --  3.2  --   --   --    Liver Function Tests: Recent Labs  Lab 12/31/19 0207 12/31/19 0511 01/01/20 0413  AST 86* 76* 92*  ALT 70* 56* 67*  ALKPHOS 89 71 64  BILITOT 1.3* 1.9* 1.4*  PROT 8.8* 6.9 6.3*  ALBUMIN 4.4 3.5 3.1*   No results for input(s): LIPASE, AMYLASE in the last 168 hours. No results for input(s): AMMONIA in the last 168 hours. Coagulation Profile: No results for input(s): INR, PROTIME in the last 168 hours. CBC: Recent Labs  Lab 12/31/19 0207 12/31/19 0511 01/01/20 0413  WBC 8.5 7.3 4.2  NEUTROABS 6.5  --   --   HGB 13.8 12.4* 11.3*  HCT 39.7 35.9* 33.2*  MCV 88.0 87.1 90.7  PLT 144* 108* 111*   Cardiac Enzymes: No results for input(s): CKTOTAL, CKMB, CKMBINDEX, TROPONINI in the last 168 hours. BNP: Invalid input(s): POCBNP CBG: No results for input(s): GLUCAP in the last 168 hours. HbA1C: No results for input(s): HGBA1C in the last 72 hours. Urine analysis:    Component Value Date/Time   COLORURINE YELLOW 12/31/2019 0342   APPEARANCEUR CLEAR 12/31/2019 0342   LABSPEC 1.008 12/31/2019 0342   PHURINE 5.0 12/31/2019 0342   GLUCOSEU NEGATIVE 12/31/2019 0342   HGBUR NEGATIVE 12/31/2019 0342   BILIRUBINUR NEGATIVE 12/31/2019 0342   KETONESUR NEGATIVE 12/31/2019 0342   PROTEINUR NEGATIVE 12/31/2019 0342   NITRITE NEGATIVE 12/31/2019 0342   LEUKOCYTESUR NEGATIVE 12/31/2019 0342   Sepsis Labs: @LABRCNTIP (procalcitonin:4,lacticidven:4) ) Recent Results (from the past 240 hour(s))  Respiratory  Panel by RT PCR (Flu A&B, Covid) - Nasopharyngeal Swab     Status: None   Collection Time: 12/31/19  3:39 AM   Specimen: Nasopharyngeal Swab  Result Value Ref Range Status   SARS Coronavirus 2 by RT PCR NEGATIVE NEGATIVE Final    Comment: (NOTE) SARS-CoV-2 target nucleic acids are NOT DETECTED. The SARS-CoV-2 RNA is generally detectable in upper respiratoy specimens during the acute phase of infection. The lowest concentration of SARS-CoV-2 viral copies this assay can detect is 131 copies/mL. A negative result does not preclude SARS-Cov-2 infection and should not be used as the sole basis for treatment or other patient management decisions. A negative result may occur with  improper specimen collection/handling, submission of specimen other than nasopharyngeal swab, presence of viral mutation(s) within the areas targeted by this assay, and inadequate number of viral copies (<131 copies/mL). A negative result must be combined with clinical observations, patient history, and epidemiological information. The expected result is Negative. Fact Sheet for Patients:  03/01/2020 Fact Sheet for Healthcare Providers:  This test  is not yet ap proved or cleared by the [place] and  has been authorized for detection and/or diagnosis of SARS-CoV-2 by FDA under an Emergency Use Authorization (EUA). This EUA will remain  in effect (meaning this test can be used) for the duration of the COVID-19 declaration under Section 564(b)(1) of the Act, 21 U.S.C. section 360bbb-3(b)(1), unless the authorization is terminated or revoked sooner.    Influenza A by PCR NEGATIVE NEGATIVE Final   Influenza B by PCR NEGATIVE NEGATIVE Final    Comment: (NOTE) The Xpert Xpress SARS-CoV-2/FLU/RSV assay is intended as an aid in  the diagnosis of influenza from Nasopharyngeal swab specimens and  should not be used as a sole basis for  treatment. Nasal washings and  aspirates are unacceptable for Xpert Xpress SARS-CoV-2/FLU/RSV  testing. Fact Sheet for Patients: https://www.moore.com/ Fact Sheet for Healthcare Providers: https://www.young.biz/ This test is not yet approved or cleared by the [place] FDA and  has been authorized for detection and/or diagnosis of SARS-CoV-2 by  FDA under an Emergency Use Authorization (EUA). This EUA will remain  in effect (meaning this test can be used) for the duration of the  Covid-19 declaration under Section 564(b)(1) of the Act, 21  U.S.C. section 360bbb-3(b)(1), unless the authorization is  terminated or revoked. Performed at [facility], [address], [place], [place] [address]   MRSA PCR Screening     Status: None   Collection Time: 12/31/19 11:52 AM   Specimen: Nasopharyngeal  Result Value Ref Range Status   MRSA by PCR NEGATIVE NEGATIVE Final    Comment:        The GeneXpert MRSA Assay (FDA approved for NASAL specimens only), is one component of a comprehensive MRSA colonization surveillance program. It is not intended to diagnose MRSA infection nor to guide or monitor treatment for MRSA infections. Performed at [facility], [address], [place], [place] [address]      Scheduled Meds: . Chlorhexidine Gluconate Cloth  6 each Topical Daily  . enoxaparin (LOVENOX) injection  40 mg Subcutaneous Q24H  . feeding supplement (ENSURE ENLIVE)  237 mL Oral BID BM  . folic acid  1 mg Oral Daily  . LORazepam  0-4 mg Intravenous Q4H   Followed by  . [START ON 01/02/2020] LORazepam  0-4 mg Intravenous Q8H  . multivitamin with minerals  1 tablet Oral Daily  . thiamine  100 mg Oral Daily   Or  . thiamine  100 mg Intravenous Daily   Continuous Infusions: . chlorproMAZINE (THORAZINE) IV Stopped (12/31/19 2105)  . dextrose 5 % with KCl 20 mEq / L 20 mEq (01/01/20 0831)    Procedures/Studies: DG Chest 2 View  Result Date:  12/31/2019 CLINICAL DATA:  Hiccups for 1 week, emesis, weight loss, tobacco abuse EXAM: CHEST - 2 VIEW COMPARISON:  10/20/2019 FINDINGS: The heart size and mediastinal contours are within normal limits. Both lungs are clear. The visualized skeletal structures are unremarkable. IMPRESSION: No active cardiopulmonary disease. Electronically Signed   By: Sharlet Salina M.D.   On: 12/31/2019 02:00   CT ABDOMEN PELVIS W CONTRAST  Result Date: 12/31/2019 CLINICAL DATA:  Intractable hiccups.  Nausea and vomiting. EXAM: CT ABDOMEN AND PELVIS WITH CONTRAST TECHNIQUE: Multidetector CT imaging of the abdomen and pelvis was performed using the standard protocol following bolus administration of intravenous contrast. CONTRAST:  81mL OMNIPAQUE IOHEXOL 300 MG/ML  SOLN COMPARISON:  None. FINDINGS: Lower chest: Lung bases are clear. Hepatobiliary: Diffuse low-attenuation liver. No biliary  duct dilatation. Gallbladder normal. Common bile duct normal at 5 mm (image 39/5). Portal veins patent. Pancreas: Pancreas is normal. No ductal dilatation. No pancreatic inflammation. Spleen: Normal spleen Adrenals/urinary tract: Adrenal glands and kidneys are normal. The ureters and bladder normal. Bladder is distended. Stomach/Bowel: Small hiatal hernia. Stomach, duodenum small-bowel normal. Appendix not identified. The colon and rectosigmoid colon are normal. Vascular/Lymphatic: Abdominal aorta is normal caliber with atherosclerotic calcification. There is no retroperitoneal or periportal lymphadenopathy. No pelvic lymphadenopathy. Reproductive: Prostate normal Other: No free fluid. Musculoskeletal: No aggressive osseous lesion. IMPRESSION: 1. Severe hepatic steatosis. 2. No biliary duct dilatation. 3. Normal pancreas. 4. No ascites. 5. Small hiatal hernia. Electronically Signed   By: Suzy Bouchard M.D.   On: 12/31/2019 11:01   US Abdomen Limited RUQ  Result Date: 12/31/2019 CLINICAL DATA:  Elevated liver enzymes EXAM: ULTRASOUND ABDOMEN  LIMITED RIGHT UPPER QUADRANT COMPARISON:  CT abdomen and pelvis Dec 31, 2019 FINDINGS: Gallbladder: No gallstones or wall thickening visualized. There is no pericholecystic fluid. No sonographic Murphy sign noted by sonographer. Common bile duct: Diameter: 4 mm. No intrahepatic or extrahepatic biliary duct dilatation. Liver: No focal lesion identified. Liver echogenicity is increased diffusely. Portal vein is patent on color Doppler imaging with normal direction of blood flow towards the liver. Other: None. IMPRESSION: Diffuse increase in liver echogenicity, a finding indicative of hepatic steatosis. No focal liver lesions are evident; it must be cautioned that the sensitivity of ultrasound for detection of focal liver lesions is diminished in this circumstance. Study otherwise unremarkable. Electronically Signed   By: Lowella Grip III M.D.   On: 12/31/2019 11:46    Orson Eva, DO  [facility]  If 7PM-7AM, please contact night-coverage [email or web address] Password [email or web address] 01/01/2020, 9:00 AM   LOS: 1 day

## 2020-01-02 DIAGNOSIS — F10939 Alcohol use, unspecified with withdrawal, unspecified: Secondary | ICD-10-CM

## 2020-01-02 DIAGNOSIS — F10230 Alcohol dependence with withdrawal, uncomplicated: Secondary | ICD-10-CM

## 2020-01-02 DIAGNOSIS — F10239 Alcohol dependence with withdrawal, unspecified: Secondary | ICD-10-CM

## 2020-01-02 LAB — BASIC METABOLIC PANEL
Anion gap: 9 (ref 5–15)
BUN: 7 mg/dL (ref 6–20)
CO2: 26 mmol/L (ref 22–32)
Calcium: 8.7 mg/dL — ABNORMAL LOW (ref 8.9–10.3)
Chloride: 97 mmol/L — ABNORMAL LOW (ref 98–111)
Creatinine, Ser: 0.54 mg/dL — ABNORMAL LOW (ref 0.61–1.24)
GFR calc Af Amer: >60 mL/min (ref >60)
GFR calc non Af Amer: >60 mL/min (ref >60)
Glucose, Bld: 97 mg/dL (ref 70–99)
Potassium: 3.8 mmol/L (ref 3.5–5.1)
Sodium: 132 mmol/L — ABNORMAL LOW (ref 135–145)

## 2020-01-02 LAB — CBC
HCT: 31.9 % — ABNORMAL LOW (ref 39.0–52.0)
Hemoglobin: 10.6 g/dL — ABNORMAL LOW (ref 13.0–17.0)
MCH: 30.7 pg (ref 26.0–34.0)
MCHC: 33.2 g/dL (ref 30.0–36.0)
MCV: 92.5 fL (ref 80.0–100.0)
Platelets: 138 10*3/uL — ABNORMAL LOW (ref 150–400)
RBC: 3.45 MIL/uL — ABNORMAL LOW (ref 4.22–5.81)
RDW: 12.6 % (ref 11.5–15.5)
WBC: 4.8 10*3/uL (ref 4.0–10.5)
nRBC: 0 % (ref 0.0–0.2)

## 2020-01-02 LAB — HEPATITIS B SURFACE ANTIGEN: Hepatitis B Surface Ag: NEGATIVE — AB

## 2020-01-02 MED ORDER — THIAMINE HCL 100 MG/ML IJ SOLN
500.0000 mg | Freq: Three times a day (TID) | INTRAVENOUS | Status: DC
  Administered 2020-01-02 – 2020-01-03 (×3): 500 mg via INTRAVENOUS
  Filled 2020-01-02 (×6): qty 5

## 2020-01-02 MED ORDER — HYDRALAZINE HCL 20 MG/ML IJ SOLN: 5.0000 mg | Freq: Four times a day (QID) | INTRAMUSCULAR | Status: DC | PRN

## 2020-01-02 NOTE — Progress Notes (Signed)
Placed call to patient spouse on chart Caleb Kim) to update about patient transferring to room 314. Message left on voicemail to return call when she is available.

## 2020-01-02 NOTE — Progress Notes (Addendum)
PROGRESS NOTE  Caleb Kim BPZ:[id number] DOB: March 18, 1961 DOA: 12/31/2019 PCP: Patient, No Pcp Per  Brief History:  59 year old male with a history of alcohol and tobacco abuse presented secondary to intractable hiccups. The patient states that he began hiccuping on 12/25/2019 with intractable nausea and vomiting resulting from his hiccups. The patient continues to drink 6 x 40 ozbeers on a daily basis. He has done so for approximately 10 years. He denies any illegal drugs. He smokes approximately 3 to 4 cigars on a daily basis. He denies any hematemesis, abdominal pain, diarrhea, hematochezia, melena. He does have abdominal discomfort when his hiccups recur. He has not had any fevers, chills, chest pain, hemoptysis, shortness of breath. He has had a cough with white sputum production. He has not had any dysuria or hematuria. He denies any new medications or over-the-counter medications. In the emergency department, the patient had low-grade temperature 9 9.1 F. He was hemodynamically stable with oxygen saturation 96 to 97% room air. Sodium was 117. Serum creatinine 0.64. AST 86, ALT 70, alkaline phosphatase 89, total bilirubin 1.3. WBC 8.5, hemoglobin 13.8, platelets 1 44,000. Because of his hyponatremia the patient was admitted for further evaluation.  Assessment/Plan: Hyponatremia -Secondary to Potomania and volume depletion from his decreased oral intake and vomiting -Initially monitored sodium every 4 hours -switch to D5W to prevent too rapid of Na correction -Na ultimately stabilized -Urine osmolarity--107 -Serum osmolarity--263 -FeNa--0.21%  Alcohol abuse and alcohol withdrawal -Alcohol withdrawal protocol -Urine drug screen--neg -alcohol level 29 -Evening 01/01/2020--patient developed agitation and was not redirectable--started on Ativan withdrawal protocol -UA neg for pyuria  Intractable hiccups and vomiting -CT abdomen and pelvis--severe hepatic  steatosis, no ascites, normal pancreas, no ductal dilatation -As needed Thorazine -Personally reviewed chest x-ray--no infiltrates or edema -initially npo with bowel rest -diet advanced which he has tolerated  Upper Airway Cough Syndrome -this may be inciting his hiccoughs -jolly ranchers -hycodan  Tobacco abuse -Tobacco cessation discussed  Transaminasemia -Hepatitis B surface antigen--negative -Hepatitis C antibody--negative -Right upper quadrant ultrasound--diffuse liver echogenicity  Thrombocytopenia -Secondary to the patient's alcohol abuse -B12--1038 -Folic acid--6.7 -TSH--0.788 -Platelets remained stable without signs of bleeding  Underweight -appreciate nutrition eval -continue Ensure       Disposition Plan: Patient From: Home D/C Place: Home - 1-2  Days Barriers: Not Clinically Stable--experiencing alcohol withdrawal  Family Communication:   Spouse updated at bedside-01/02/2020  Consultants: None  Code Status:  FULL  DVT Prophylaxis:   Hunt Lovenox   Procedures: As Listed in Progress Note Above  Antibiotics: None       Subjective: Patient feels that his hiccups are improving.  He has not had any further vomiting.  He denies any headache, chest pain, shortness breath, diarrhea, abdominal pain, dysuria, hematuria.  There is no hemoptysis.  Objective: Vitals:   01/02/20 0600 01/02/20 0900 01/02/20 1357 01/02/20 1414  BP: 130/80 (!) 137/94 127/84 (!) 137/94  Pulse: 84 87 80 (!) 102  Resp: 15 18 18 20   Temp:  98.5 F (36.9 C) 98.4 F (36.9 C) (!) 97.5 F (36.4 C)  TempSrc:  Oral Oral Oral  SpO2: 98% 98% 100% 100%  Weight:      Height:        Intake/Output Summary (Last 24 hours) at 01/02/2020 1607 Last data filed at 01/02/2020 0300 Gross per 24 hour  Intake 562.98 ml  Output 300 ml  Net 262.98 ml   Weight change: -0.4 kg Exam:  General:  Pt is alert, follows commands appropriately, not in acute distress  HEENT: No  icterus, No thrush, No neck mass, [place]/AT  Cardiovascular: RRR, S1/S2, no rubs, no gallops  Respiratory: Bibasilar rales but no wheezing.  Good air movement.  Abdomen: Soft/+BS, non tender, non distended, no guarding  Extremities: No edema, No lymphangitis, No petechiae, No rashes, no synovitis   Data Reviewed: I have personally reviewed following labs and imaging studies Basic Metabolic Panel: Recent Labs  Lab 12/31/19 0207 12/31/19 0207 12/31/19 0511 12/31/19 1055 01/01/20 0413 01/01/20 0413 01/01/20 0931 01/01/20 1305 01/01/20 1652 01/01/20 2057 01/02/20 0447  NA 117*   < > 121*   < > 131*   < > 128* 125* 127* 129* 132*  K 3.1*   < > 4.4   < > 3.3*   < > 3.3* 3.7 3.7 3.8 3.8  CL 73*   < > 83*   < > 94*   < > 91* 91* 92* 94* 97*  CO2 28   < > 23   < > 28   < > 28 25 25 26 26   GLUCOSE 94   < > 82   < > 92   < > 136* 112* 134* 105* 97  BUN 11   < > 9   < > 9   < > 9 10 9 9 7   CREATININE 0.64   < > 0.64   < > 0.63   < > 0.79 0.73 0.74 0.62 0.54*  CALCIUM 9.2   < > 8.5*   < > 8.7*   < > 8.5* 8.3* 8.4* 8.7* 8.7*  MG 2.1  --  2.2  --  2.0  --   --   --   --   --   --   PHOS  --   --  3.2  --   --   --   --   --   --   --   --    < > = values in this interval not displayed.   Liver Function Tests: Recent Labs  Lab 12/31/19 0207 12/31/19 0511 01/01/20 0413  AST 86* 76* 92*  ALT 70* 56* 67*  ALKPHOS 89 71 64  BILITOT 1.3* 1.9* 1.4*  PROT 8.8* 6.9 6.3*  ALBUMIN 4.4 3.5 3.1*   No results for input(s): LIPASE, AMYLASE in the last 168 hours. No results for input(s): AMMONIA in the last 168 hours. Coagulation Profile: No results for input(s): INR, PROTIME in the last 168 hours. CBC: Recent Labs  Lab 12/31/19 0207 12/31/19 0511 01/01/20 0413 01/02/20 0447  WBC 8.5 7.3 4.2 4.8  NEUTROABS 6.5  --   --   --   HGB 13.8 12.4* 11.3* 10.6*  HCT 39.7 35.9* 33.2* 31.9*  MCV 88.0 87.1 90.7 92.5  PLT 144* 108* 111* 138*   Cardiac Enzymes: No results for input(s): CKTOTAL,  CKMB, CKMBINDEX, TROPONINI in the last 168 hours. BNP: Invalid input(s): POCBNP CBG: No results for input(s): GLUCAP in the last 168 hours. HbA1C: No results for input(s): HGBA1C in the last 72 hours. Urine analysis:    Component Value Date/Time   COLORURINE YELLOW 12/31/2019 0342   APPEARANCEUR CLEAR 12/31/2019 0342   LABSPEC 1.008 12/31/2019 0342   PHURINE 5.0 12/31/2019 0342   GLUCOSEU NEGATIVE 12/31/2019 0342   HGBUR NEGATIVE 12/31/2019 0342   BILIRUBINUR NEGATIVE 12/31/2019 0342   KETONESUR NEGATIVE 12/31/2019 0342   PROTEINUR NEGATIVE 12/31/2019 0342   NITRITE NEGATIVE  12/31/2019 0342   LEUKOCYTESUR NEGATIVE 12/31/2019 0342   Sepsis Labs: @LABRCNTIP (procalcitonin:4,lacticidven:4) ) Recent Results (from the past 240 hour(s))  Respiratory Panel by RT PCR (Flu A&B, Covid) - Nasopharyngeal Swab     Status: None   Collection Time: 12/31/19  3:39 AM   Specimen: Nasopharyngeal Swab  Result Value Ref Range Status   SARS Coronavirus 2 by RT PCR NEGATIVE NEGATIVE Final    Comment: (NOTE) SARS-CoV-2 target nucleic acids are NOT DETECTED. The SARS-CoV-2 RNA is generally detectable in upper respiratoy specimens during the acute phase of infection. The lowest concentration of SARS-CoV-2 viral copies this assay can detect is 131 copies/mL. A negative result does not preclude SARS-Cov-2 infection and should not be used as the sole basis for treatment or other patient management decisions. A negative result may occur with  improper specimen collection/handling, submission of specimen other than nasopharyngeal swab, presence of viral mutation(s) within the areas targeted by this assay, and inadequate number of viral copies (<131 copies/mL). A negative result must be combined with clinical observations, patient history, and epidemiological information. The expected result is Negative. Fact Sheet for Patients:  PinkCheek.be Fact Sheet for Healthcare  Providers:  GravelBags.it This test is not yet ap proved or cleared by the [place] FDA and  has been authorized for detection and/or diagnosis of SARS-CoV-2 by FDA under an Emergency Use Authorization (EUA). This EUA will remain  in effect (meaning this test can be used) for the duration of the COVID-19 declaration under Section 564(b)(1) of the Act, 21 U.S.C. section 360bbb-3(b)(1), unless the authorization is terminated or revoked sooner.    Influenza A by PCR NEGATIVE NEGATIVE Final   Influenza B by PCR NEGATIVE NEGATIVE Final    Comment: (NOTE) The Xpert Xpress SARS-CoV-2/FLU/RSV assay is intended as an aid in  the diagnosis of influenza from Nasopharyngeal swab specimens and  should not be used as a sole basis for treatment. Nasal washings and  aspirates are unacceptable for Xpert Xpress SARS-CoV-2/FLU/RSV  testing. Fact Sheet for Patients: PinkCheek.be Fact Sheet for Healthcare Providers: GravelBags.it This test is not yet approved or cleared by the [place] FDA and  has been authorized for detection and/or diagnosis of SARS-CoV-2 by  FDA under an Emergency Use Authorization (EUA). This EUA will remain  in effect (meaning this test can be used) for the duration of the  Covid-19 declaration under Section 564(b)(1) of the Act, 21  U.S.C. section 360bbb-3(b)(1), unless the authorization is  terminated or revoked. Performed at [facility], [address] [place], [place] [address]   MRSA PCR Screening     Status: None   Collection Time: 12/31/19 11:52 AM   Specimen: Nasopharyngeal  Result Value Ref Range Status   MRSA by PCR NEGATIVE NEGATIVE Final    Comment:        The GeneXpert MRSA Assay (FDA approved for NASAL specimens only), is one component of a comprehensive MRSA colonization surveillance program. It is not intended to diagnose MRSA infection nor to guide or monitor  treatment for MRSA infections. Performed at [facility], [address] [place], [place] [address]      Scheduled Meds: . Chlorhexidine Gluconate Cloth  6 each Topical Daily  . enoxaparin (LOVENOX) injection  40 mg Subcutaneous Q24H  . feeding supplement (ENSURE ENLIVE)  237 mL Oral BID BM  . folic acid  1 mg Oral Daily  . LORazepam  0-4 mg Intravenous Q8H  . multivitamin with minerals  1 tablet Oral Daily  Continuous Infusions: . 0.9 % NaCl with KCl 20 mEq / L 50 mL/hr at 01/01/20 1731  . chlorproMAZINE (THORAZINE) IV 12.5 mg (01/01/20 1900)  . thiamine injection 500 mg (01/02/20 1529)    Procedures/Studies: DG Chest 2 View  Result Date: 12/31/2019 CLINICAL DATA:  Hiccups for 1 week, emesis, weight loss, tobacco abuse EXAM: CHEST - 2 VIEW COMPARISON:  10/20/2019 FINDINGS: The heart size and mediastinal contours are within normal limits. Both lungs are clear. The visualized skeletal structures are unremarkable. IMPRESSION: No active cardiopulmonary disease. Electronically Signed   By: Sharlet Salina M.D.   On: 12/31/2019 02:00   CT ABDOMEN PELVIS W CONTRAST  Result Date: 12/31/2019 CLINICAL DATA:  Intractable hiccups.  Nausea and vomiting. EXAM: CT ABDOMEN AND PELVIS WITH CONTRAST TECHNIQUE: Multidetector CT imaging of the abdomen and pelvis was performed using the standard protocol following bolus administration of intravenous contrast. CONTRAST:  46mL OMNIPAQUE IOHEXOL 300 MG/ML  SOLN COMPARISON:  None. FINDINGS: Lower chest: Lung bases are clear. Hepatobiliary: Diffuse low-attenuation liver. No biliary duct dilatation. Gallbladder normal. Common bile duct normal at 5 mm (image 39/5). Portal veins patent. Pancreas: Pancreas is normal. No ductal dilatation. No pancreatic inflammation. Spleen: Normal spleen Adrenals/urinary tract: Adrenal glands and kidneys are normal. The ureters and bladder normal. Bladder is distended. Stomach/Bowel: Small hiatal hernia. Stomach, duodenum small-bowel  normal. Appendix not identified. The colon and rectosigmoid colon are normal. Vascular/Lymphatic: Abdominal aorta is normal caliber with atherosclerotic calcification. There is no retroperitoneal or periportal lymphadenopathy. No pelvic lymphadenopathy. Reproductive: Prostate normal Other: No free fluid. Musculoskeletal: No aggressive osseous lesion. IMPRESSION: 1. Severe hepatic steatosis. 2. No biliary duct dilatation. 3. Normal pancreas. 4. No ascites. 5. Small hiatal hernia. Electronically Signed   By: Genevive Bi M.D.   On: 12/31/2019 11:01   US Abdomen Limited RUQ  Result Date: 12/31/2019 CLINICAL DATA:  Elevated liver enzymes EXAM: ULTRASOUND ABDOMEN LIMITED RIGHT UPPER QUADRANT COMPARISON:  CT abdomen and pelvis Dec 31, 2019 FINDINGS: Gallbladder: No gallstones or wall thickening visualized. There is no pericholecystic fluid. No sonographic Murphy sign noted by sonographer. Common bile duct: Diameter: 4 mm. No intrahepatic or extrahepatic biliary duct dilatation. Liver: No focal lesion identified. Liver echogenicity is increased diffusely. Portal vein is patent on color Doppler imaging with normal direction of blood flow towards the liver. Other: None. IMPRESSION: Diffuse increase in liver echogenicity, a finding indicative of hepatic steatosis. No focal liver lesions are evident; it must be cautioned that the sensitivity of ultrasound for detection of focal liver lesions is diminished in this circumstance. Study otherwise unremarkable. Electronically Signed   By: Bretta Bang III M.D.   On: 12/31/2019 11:46    Catarina Hartshorn, DO  [facility]  If 7PM-7AM, please contact night-coverage [email or web address] Password [email or web address] 01/02/2020, 4:07 PM   LOS: 2 days

## 2020-01-02 NOTE — TOC Progression Note (Signed)
Transition of Care [facility]) - Progression Note    Patient Details  Name: Caleb Kim MRN: [id number] Date of Birth: 10/08/1960  Transition of Care [facility]) CM/SW Contact  Salome Arnt, [place] Phone Number: 01/02/2020, 2:06 PM  Clinical Narrative:  LCSW met with pt at bedside. Pt reports he lives with his wife. PT evaluated pt and recommend home health. However, due to no insurance and pt ambulated 250', LCSW discussed outpatient PT referral with pt who is agreeable. Order entered for outpatient PT and information on AVS. Anticipate possible d/c today per MD.     Expected Discharge Plan: Home/Self Care Barriers to Discharge: Continued Medical Work up  Expected Discharge Plan and Services Expected Discharge Plan: Home/Self Care In-house Referral: Clinical Social Work     Living arrangements for the past 2 months: [place]

## 2020-01-02 NOTE — Progress Notes (Signed)
Physical Therapy Treatment Patient Details Name: Caleb Kim MRN: [id number] DOB: [birth year] Today's Date: 01/02/2020    History of Present Illness 59 year old male with a history of alcohol and tobacco abuse presented secondary to intractable hiccups.  The patient states that he began hiccuping on 12/25/2019 with intractable nausea and vomiting resulting from his hiccups.  The patient continues to drink 6 x 40 oz beers on a daily basis.  He has done so for approximately 10 years.  He denies any illegal drugs.  He smokes approximately 3 to 4 cigars on a daily basis.  He denies any hematemesis, abdominal pain, diarrhea, hematochezia, melena.  He does have abdominal discomfort when his hiccups recur.  He has not had any fevers, chills, chest pain, hemoptysis, shortness of breath.  He has had a cough with white sputum production.  He has not had any dysuria or hematuria.  He denies any new medications or over-the-counter medications.    PT Comments    Pt pleasant and willing to participate with therapy today.  Pt able to complete bed mobility indepdenptly and transfer out of bed with no LOB upon standing.  Used RW for stability with gait, no LOB episodes though pt does have tendency to drift from wall to wall when walking and difficulty maneuvering with sharp turns. EOS pt left in chair with call bell within place and chair alarm set.  Pt's catheter fell off during seated exercises, RN aware.     Follow Up Recommendations  Home health PT;Supervision for mobility/OOB;Supervision/Assistance - 24 hour     Equipment Recommendations  3in1 (PT)    Recommendations for Other Services       Precautions / Restrictions Precautions Precautions: Fall Restrictions Weight Bearing Restrictions: No    Mobility  Bed Mobility Overal bed mobility: Modified Independent             General bed mobility comments: slow mechanics, able to complete independently  Transfers Overall transfer level:  Modified independent Equipment used: Rolling walker (2 wheeled) Transfers: Sit to/from Stand Sit to Stand: Min guard         General transfer comment: able to stand min guard, no LOB  Ambulation/Gait Ambulation/Gait assistance: Min guard Gait Distance (Feet): 250 Feet Assistive device: Rolling walker (2 wheeled) Gait Pattern/deviations: Step-through pattern;Decreased step length - right;Decreased step length - left;Decreased stride length;Trunk flexed Gait velocity: decreased   General Gait Details: slow, labored gait, cueing to increase stride length for normalized gait mechanics.  Tendency to drift during gait, no LOB with use of RW.  Difficulty staying inside RW during sharp turns.   Stairs             Wheelchair Mobility    Modified Rankin (Stroke Patients Only)       Balance                                            Cognition Arousal/Alertness: Awake/alert Behavior During Therapy: WFL for tasks assessed/performed Overall Cognitive Status: Within Functional Limits for tasks assessed(Pt received Ativan prior session, was calm through session)                                        Exercises General Exercises - Lower Extremity Ankle Circles/Pumps: AROM;Both;10 reps Long Arc Quad: AROM;10 reps  Hip Flexion/Marching: AROM;10 reps    General Comments        Pertinent Vitals/Pain Pain Assessment: No/denies pain    Home Living                      Prior Function            PT Goals (current goals can now be found in the care plan section)      Frequency    Min 3X/week      PT Plan      Co-evaluation              AM-PAC PT "6 Clicks" Mobility   Outcome Measure  Help needed turning from your back to your side while in a flat bed without using bedrails?: A Little Help needed moving from lying on your back to sitting on the side of a flat bed without using bedrails?: A Little Help needed  moving to and from a bed to a chair (including a wheelchair)?: A Little Help needed standing up from a chair using your arms (e.g., wheelchair or bedside chair)?: A Little Help needed to walk in hospital room?: A Little Help needed climbing 3-5 steps with a railing? : A Little 6 Click Score: 18    End of Session Equipment Utilized During Treatment: Gait belt Activity Tolerance: Patient tolerated treatment well;Patient limited by fatigue Patient left: in chair;with call bell/phone within reach;with chair alarm set Nurse Communication: Mobility status(RN aware of status in chair and catheter) PT Visit Diagnosis: Unsteadiness on feet (R26.81);Other abnormalities of gait and mobility (R26.89);Muscle weakness (generalized) (M62.81)     Time: 1010-1030 PT Time Calculation (min) (ACUTE ONLY): 20 min  Charges:  $Therapeutic Activity: 8-22 mins                    Becky Sax, LPTA/CLT; CBIS [phone]  Juel Burrow 01/02/2020, 2:29 PM

## 2020-01-02 NOTE — Progress Notes (Signed)
Patient's BP 163/105.  Provider notified.  Provider verbally ordered that "no intervention" be made.

## 2020-01-03 NOTE — Plan of Care (Signed)
  Problem: Education: Goal: Knowledge of General Education information will improve Description: Including pain rating scale, medication(s)/side effects and non-pharmacologic comfort measures Outcome: Adequate for Discharge   Problem: Health Behavior/Discharge Planning: Goal: Ability to manage health-related needs will improve Outcome: Adequate for Discharge   Problem: Clinical Measurements: Goal: Ability to maintain clinical measurements within normal limits will improve Outcome: Adequate for Discharge Goal: Will remain free from infection Outcome: Adequate for Discharge Goal: Diagnostic test results will improve Outcome: Adequate for Discharge Goal: Cardiovascular complication will be avoided Outcome: Adequate for Discharge   Problem: Activity: Goal: Risk for activity intolerance will decrease Outcome: Adequate for Discharge   Problem: Nutrition: Goal: Adequate nutrition will be maintained Outcome: Adequate for Discharge   Problem: Coping: Goal: Level of anxiety will decrease Outcome: Adequate for Discharge   Problem: Elimination: Goal: Will not experience complications related to bowel motility Outcome: Adequate for Discharge   Problem: Pain Managment: Goal: General experience of comfort will improve Outcome: Adequate for Discharge   Problem: Safety: Goal: Ability to remain free from injury will improve Outcome: Adequate for Discharge   Problem: Education: Goal: Knowledge of disease or condition will improve Outcome: Adequate for Discharge Goal: Understanding of discharge needs will improve Outcome: Adequate for Discharge   Problem: Health Behavior/Discharge Planning: Goal: Ability to identify changes in lifestyle to reduce recurrence of condition will improve Outcome: Adequate for Discharge Goal: Identification of resources available to assist in meeting health care needs will improve Outcome: Adequate for Discharge   Problem: Physical Regulation: Goal:  Complications related to the disease process, condition or treatment will be avoided or minimized Outcome: Adequate for Discharge   Problem: Safety: Goal: Ability to remain free from injury will improve Outcome: Adequate for Discharge

## 2020-01-16 ENCOUNTER — Ambulatory Visit (HOSPITAL_COMMUNITY): Payer: Self-pay | Attending: Internal Medicine | Admitting: Physical Therapy

## 2020-01-16 ENCOUNTER — Other Ambulatory Visit: Payer: Self-pay

## 2020-01-16 ENCOUNTER — Encounter (HOSPITAL_COMMUNITY): Payer: Self-pay | Admitting: Physical Therapy

## 2020-01-16 DIAGNOSIS — R2689 Other abnormalities of gait and mobility: Secondary | ICD-10-CM | POA: Insufficient documentation

## 2020-01-16 DIAGNOSIS — R29898 Other symptoms and signs involving the musculoskeletal system: Secondary | ICD-10-CM | POA: Insufficient documentation

## 2020-01-16 NOTE — Therapy (Signed)
[facility] [address] [place], [place], [address] Phone: [phone]   Fax:  [phone]  Physical Therapy Evaluation/Discharge Summary  Patient Details  Name: Caleb Kim MRN: [id number] Date of Birth: 1960/11/10 Referring Provider (PT): Orson Eva MD   Encounter Date: 01/16/2020   PHYSICAL THERAPY DISCHARGE SUMMARY  Visits from Start of Care: 1  Current functional level related to goals / functional outcomes: See below   Remaining deficits: See below   Education / Equipment: See below  Plan: Patient agrees to discharge.  Patient goals were met. Patient is being discharged due to being pleased with the current functional level.  ?????       PT End of Session - 01/16/20 1504    Visit Number  1    Number of Visits  1    Date for PT Re-Evaluation  01/16/20   one time visit   Authorization Type  Self Pay    PT Start Time  8938    PT Stop Time  1455    PT Time Calculation (min)  20 min    Activity Tolerance  Patient tolerated treatment well    Behavior During Therapy  [facility] for tasks assessed/performed       History reviewed. No pertinent past medical history.  Past Surgical History:  Procedure Laterality Date  . EXPLORATORY LAPAROTOMY      There were no vitals filed for this visit.   Subjective Assessment - 01/16/20 1442    Subjective  Patient is a 59 y.o. male who presents to physical therapy with c/o hiccups, weakness, and balance which began on 5/9/ 21which has since resolved. He stated his liver was rejecting the beer he was drinking so he was vomiting and hiccupping. All of his counts were down and they said he needed to stop drinking and he hasn't had a drink in 3 weeks. He was weak while in the hospital and had some balance issues after but is feeling good now. He just wanted to follow up to make sure he is doing alright.    Currently in Pain?  No/denies         [facility] PT Assessment - 01/16/20 0001      Assessment   Medical Diagnosis  Hiccups, weight loss, weakness, balance    Referring Provider (PT)  Orson Eva MD    Onset Date/Surgical Date  12/31/19    Next MD Visit  none    Prior Therapy  none      Precautions   Precautions  None      Restrictions   Weight Bearing Restrictions  No      Balance Screen   Has the patient fallen in the past 6 months  No    Has the patient had a decrease in activity level because of a fear of falling?   No    Is the patient reluctant to leave their home because of a fear of falling?   No      Prior Function   Level of Independence  Independent      Cognition   Overall Cognitive Status  Within Functional Limits for tasks assessed      Observation/Other Assessments   Observations  ambulates without AD      ROM / Strength   AROM / PROM / Strength  AROM;Strength      AROM   Overall AROM   Within functional limits for tasks performed      Strength  Strength Assessment Site  Hip;Knee;Ankle    Right/Left Hip  Right;Left    Right Hip Flexion  4+/5    Left Hip Flexion  4+/5    Right/Left Knee  Right;Left    Right Knee Flexion  5/5    Right Knee Extension  5/5    Left Knee Flexion  5/5    Left Knee Extension  5/5    Right/Left Ankle  Right;Left    Right Ankle Dorsiflexion  5/5    Left Ankle Dorsiflexion  5/5      Transfers   Comments  transfers without UE assist      Ambulation/Gait   Ambulation/Gait  Yes    Ambulation/Gait Assistance  7: Independent    Ambulation Distance (Feet)  430 Feet    Assistive device  None    Gait Pattern  Within Functional Limits    Ambulation Surface  Level;Indoor      Balance   Balance Assessed  Yes      Static Standing Balance   Static Standing - Balance Support  No upper extremity supported    Static Standing - Level of Assistance  7: Independent    Static Standing - Comment/# of Minutes  Right and Left SLS 30+ seconds each                  Objective measurements completed on  examination: See above findings.              PT Education - 01/16/20 1503    Education Details  Patient educated on living a healthy lifestyle, exercising, eating healthy, abstaining from alcohol    Person(s) Educated  Patient    Methods  Explanation;Demonstration    Comprehension  Verbalized understanding;Returned demonstration       PT Short Term Goals - 01/16/20 1511      PT SHORT TERM GOAL #1   Title  Patient will be educated on living an active lifestyle and activity modification    Time  1    Period  Days    Status  Achieved                Plan - 01/16/20 1506    Clinical Impression Statement  Patient is a 59 y.o. male who presents to physical therapy with c/o hiccups, weakness, and balance which began on 5/9/ 21which has since resolved. Patient demonstrates slightly reduced hip strength but all ROM and strength is WFL. Assessed gait and balance today as well and patient is not having any difficulties at this time. Patient educated on living a healthy lifestyle, abstaining from alcohol, exercising regularly, and following up with physical therapy if needed. No patient follow up needed at this time.    Personal Factors and Comorbidities  Behavior Pattern;Past/Current Experience;Time since onset of injury/illness/exacerbation;Social Background    Examination-Activity Limitations  Locomotion Level    Examination-Participation Restrictions  Yard Work    Stability/Clinical Decision Making  Stable/Uncomplicated    Clinical Decision Making  Low    Rehab Potential  Excellent    PT Frequency  One time visit    PT Treatment/Interventions  ADLs/Self Care Home Management;Aquatic Therapy;Cryotherapy;Electrical Stimulation;Iontophoresis 12m/ml Dexamethasone;Moist Heat;Patient/family education;Gait training;Stair training;Functional mobility training;Therapeutic activities;Therapeutic exercise;Balance training;Neuromuscular re-education;Manual techniques;Spinal  Manipulations;Joint Manipulations    PT Next Visit Plan  no PT follow up    PT Home Exercise Plan  Living an active healthy lifestyle    Consulted and Agree with Plan of Care  Patient  Patient will benefit from skilled therapeutic intervention in order to improve the following deficits and impairments:  Decreased activity tolerance, Decreased strength, Decreased balance  Visit Diagnosis: Other symptoms and signs involving the musculoskeletal system  Other abnormalities of gait and mobility     Problem List Patient Active Problem List   Diagnosis Date Noted  . Alcohol withdrawal ([place]) 01/02/2020  . Hyponatremia 12/31/2019  . Hypokalemia 12/31/2019  . Alcoholic intoxication without complication ([place]) 67/67/2094  . Tobacco abuse 12/31/2019  . Vomiting 12/31/2019  . Intractable hiccups 12/31/2019  . Anorexia 12/31/2019  . Transaminasemia     3:13 PM, 01/16/20 Mearl Latin PT, DPT Physical Therapist at [facility] [place], [place], [address] Phone: [phone]   Fax:  [phone]  Name: Javin Nong MRN: [id number] Date of Birth: July 21, 1961

## 2021-01-03 IMAGING — DX DG CHEST 2V
2 series · 2 of 2 positions shown · non-contrast
Comparison: 10/20/2019

CLINICAL DATA: Hiccups for 1 week, emesis, weight loss, tobacco
abuse

EXAM:
CHEST - 2 VIEW

[chest pa]
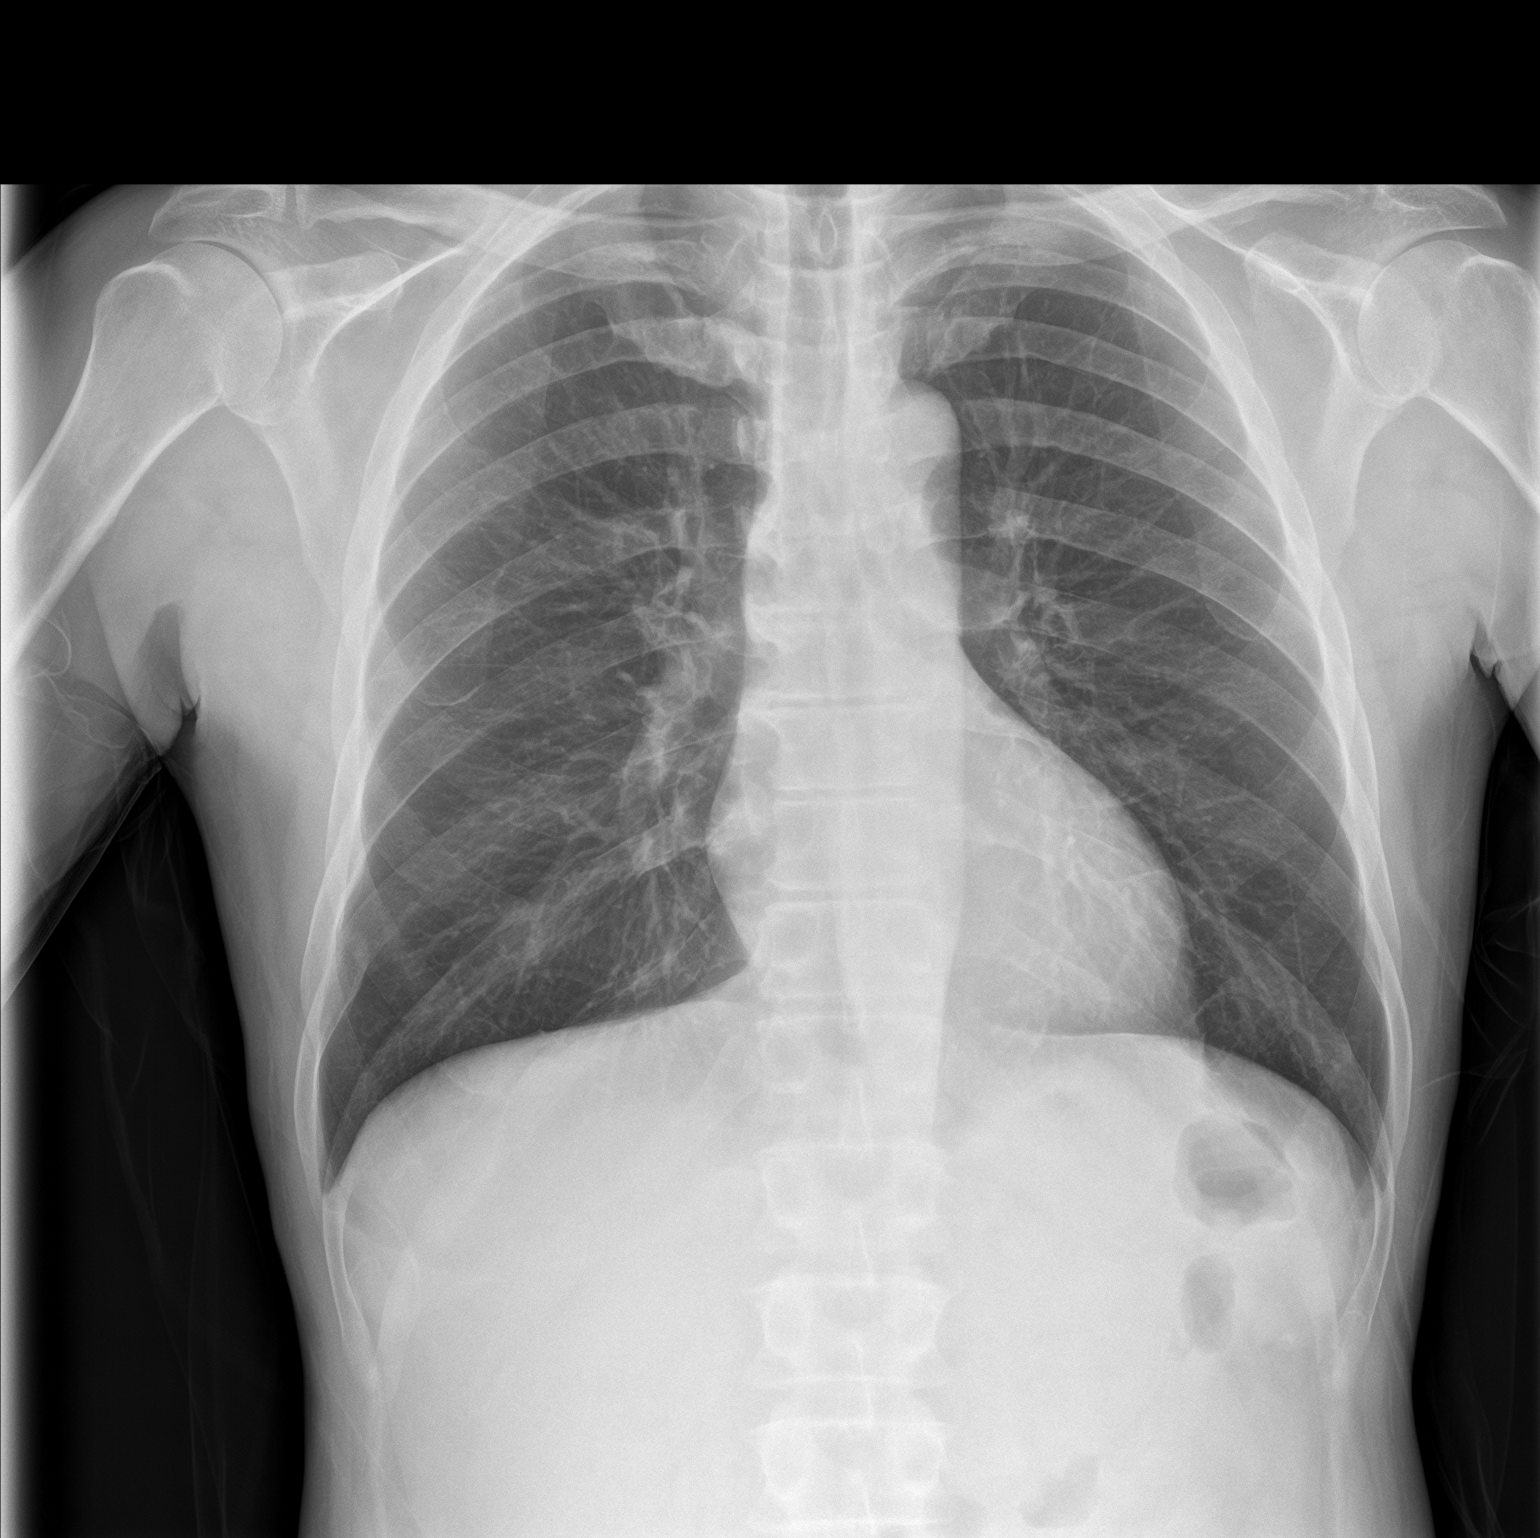

[chest lat]
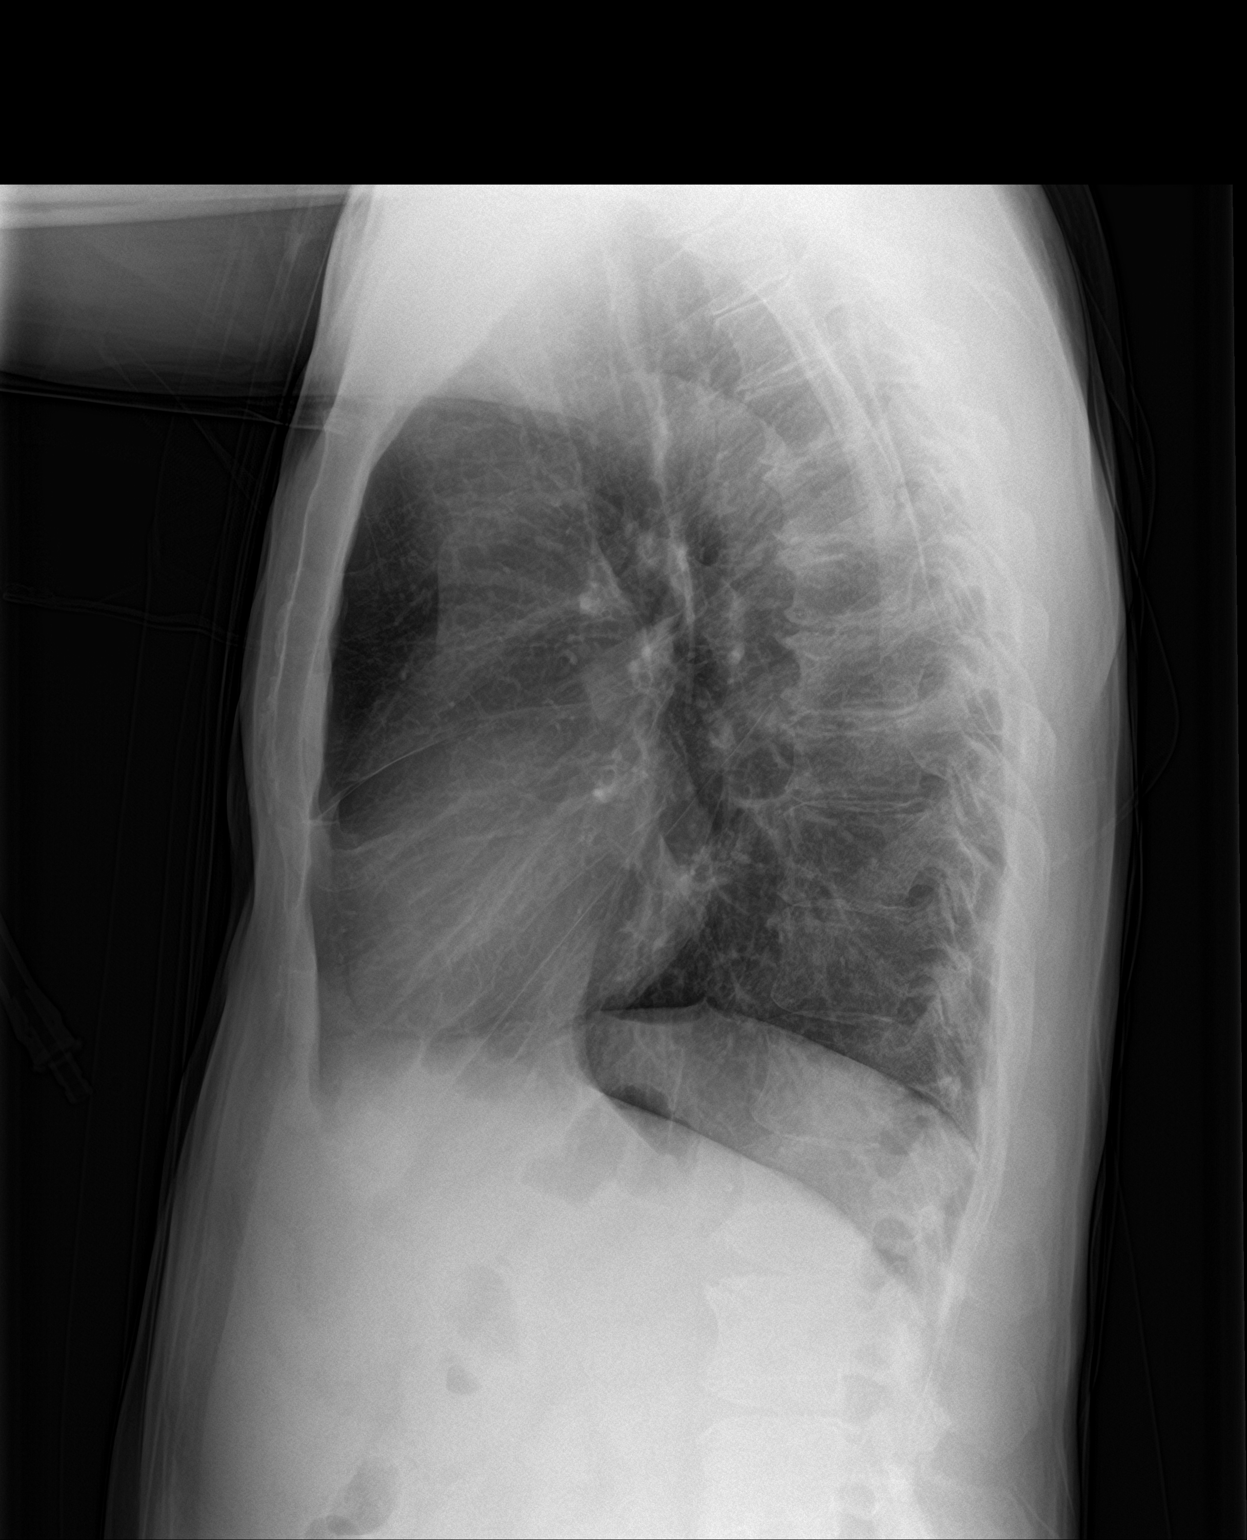

[2 of 2 positions shown; findings below may reference images not displayed]

FINDINGS: The heart size and mediastinal contours are within normal limits.
Both lungs are clear. The visualized skeletal structures are
unremarkable.
IMPRESSION: No active cardiopulmonary disease.

## 2021-10-12 ENCOUNTER — Other Ambulatory Visit: Payer: Self-pay

## 2021-10-12 ENCOUNTER — Encounter (HOSPITAL_COMMUNITY): Payer: Self-pay

## 2021-10-12 ENCOUNTER — Emergency Department (HOSPITAL_COMMUNITY)
Admission: EM | Admit: 2021-10-12 | Discharge: 2021-10-12 | Disposition: A | Discharge status: Home / Self Care | Payer: 59 | Attending: Emergency Medicine | Admitting: Emergency Medicine

## 2021-10-12 DIAGNOSIS — E871 Hypo-osmolality and hyponatremia: Secondary | ICD-10-CM | POA: Insufficient documentation

## 2021-10-12 DIAGNOSIS — R630 Anorexia: Secondary | ICD-10-CM | POA: Diagnosis not present

## 2021-10-12 DIAGNOSIS — F1010 Alcohol abuse, uncomplicated: Secondary | ICD-10-CM | POA: Insufficient documentation

## 2021-10-12 DIAGNOSIS — R112 Nausea with vomiting, unspecified: Secondary | ICD-10-CM | POA: Diagnosis present

## 2021-10-12 DIAGNOSIS — R531 Weakness: Secondary | ICD-10-CM | POA: Diagnosis not present

## 2021-10-12 DIAGNOSIS — R066 Hiccough: Secondary | ICD-10-CM | POA: Insufficient documentation

## 2021-10-12 DIAGNOSIS — R1110 Vomiting, unspecified: Secondary | ICD-10-CM

## 2021-10-12 LAB — BASIC METABOLIC PANEL
Anion gap: 14 (ref 5–15)
Anion gap: 11 (ref 5–15)
BUN: <5 mg/dL — ABNORMAL LOW (ref 6–20)
BUN: <5 mg/dL — ABNORMAL LOW (ref 6–20)
CO2: 26 mmol/L (ref 22–32)
CO2: 25 mmol/L (ref 22–32)
Calcium: 8.8 mg/dL — ABNORMAL LOW (ref 8.9–10.3)
Calcium: 8.2 mg/dL — ABNORMAL LOW (ref 8.9–10.3)
Chloride: 82 mmol/L — ABNORMAL LOW (ref 98–111)
Chloride: 93 mmol/L — ABNORMAL LOW (ref 98–111)
Creatinine, Ser: 0.56 mg/dL — ABNORMAL LOW (ref 0.61–1.24)
Creatinine, Ser: 0.44 mg/dL — ABNORMAL LOW (ref 0.61–1.24)
GFR, Estimated: >60 mL/min (ref >60)
GFR, Estimated: >60 mL/min (ref >60)
Glucose, Bld: 99 mg/dL (ref 70–99)
Glucose, Bld: 84 mg/dL (ref 70–99)
Potassium: 3.1 mmol/L — ABNORMAL LOW (ref 3.5–5.1)
Potassium: 3.5 mmol/L (ref 3.5–5.1)
Sodium: 122 mmol/L — ABNORMAL LOW (ref 135–145)
Sodium: 129 mmol/L — ABNORMAL LOW (ref 135–145)

## 2021-10-12 LAB — CBC WITH DIFFERENTIAL/PLATELET
Abs Immature Granulocytes: 0.01 10*3/uL (ref 0.00–0.07)
Basophils Absolute: 0 10*3/uL (ref 0.0–0.1)
Basophils Relative: 1 %
Eosinophils Absolute: 0 10*3/uL (ref 0.0–0.5)
Eosinophils Relative: 0 %
HCT: 36.4 % — ABNORMAL LOW (ref 39.0–52.0)
Hemoglobin: 13.3 g/dL (ref 13.0–17.0)
Immature Granulocytes: 0 %
Lymphocytes Relative: 24 %
Lymphs Abs: 1.3 10*3/uL (ref 0.7–4.0)
MCH: 32.6 pg (ref 26.0–34.0)
MCHC: 36.5 g/dL — ABNORMAL HIGH (ref 30.0–36.0)
MCV: 89.2 fL (ref 80.0–100.0)
Monocytes Absolute: 0.8 10*3/uL (ref 0.1–1.0)
Monocytes Relative: 15 %
Neutro Abs: 3.2 10*3/uL (ref 1.7–7.7)
Neutrophils Relative %: 60 %
Platelets: 138 10*3/uL — ABNORMAL LOW (ref 150–400)
RBC: 4.08 MIL/uL — ABNORMAL LOW (ref 4.22–5.81)
RDW: 11.1 % — ABNORMAL LOW (ref 11.5–15.5)
WBC: 5.4 10*3/uL (ref 4.0–10.5)
nRBC: 0 % (ref 0.0–0.2)

## 2021-10-12 LAB — MAGNESIUM: Magnesium: 1.8 mg/dL (ref 1.7–2.4)

## 2021-10-12 MED ORDER — ONDANSETRON 4 MG PO TBDP: 4.0000 mg | ORAL_TABLET | Freq: Three times a day (TID) | ORAL | 0 refills | Status: DC | PRN

## 2021-10-12 MED ORDER — SODIUM CHLORIDE 0.9 % IV BOLUS
1000.0000 mL | Freq: Once | INTRAVENOUS | Status: AC
  Administered 2021-10-12: 1000 mL via INTRAVENOUS

## 2021-10-12 MED ORDER — POTASSIUM CHLORIDE CRYS ER 20 MEQ PO TBCR
40.0000 meq | EXTENDED_RELEASE_TABLET | Freq: Once | ORAL | Status: AC
  Administered 2021-10-12: 40 meq via ORAL
  Filled 2021-10-12: qty 2

## 2021-10-12 MED ORDER — ADULT MULTIVITAMIN W/MINERALS CH
1.0000 | ORAL_TABLET | Freq: Once | ORAL | Status: AC
  Administered 2021-10-12: 1 via ORAL
  Filled 2021-10-12: qty 1

## 2021-10-12 MED ORDER — SODIUM CHLORIDE 1 G PO TABS: 1.0000 g | ORAL_TABLET | Freq: Two times a day (BID) | ORAL | 0 refills | Status: DC

## 2021-10-12 MED ORDER — SODIUM CHLORIDE 0.9 % IV BOLUS
500.0000 mL | Freq: Once | INTRAVENOUS | Status: AC
  Administered 2021-10-12: 500 mL via INTRAVENOUS

## 2021-10-12 MED ORDER — SODIUM CHLORIDE 1 G PO TABS
1.0000 g | ORAL_TABLET | Freq: Once | ORAL | Status: AC
  Administered 2021-10-12: 1 g via ORAL
  Filled 2021-10-12: qty 1

## 2021-10-12 MED ORDER — METOCLOPRAMIDE HCL 5 MG/ML IJ SOLN
10.0000 mg | Freq: Once | INTRAMUSCULAR | Status: AC
  Administered 2021-10-12: 10 mg via INTRAVENOUS
  Filled 2021-10-12: qty 2

## 2021-10-12 MED ORDER — THIAMINE HCL 100 MG PO TABS
100.0000 mg | ORAL_TABLET | Freq: Once | ORAL | Status: AC
  Administered 2021-10-12: 100 mg via ORAL
  Filled 2021-10-12: qty 1

## 2021-10-12 MED ORDER — SODIUM CHLORIDE 1 G PO TABS: 1.0000 g | ORAL_TABLET | Freq: Two times a day (BID) | ORAL | 0 refills | Status: AC

## 2021-10-12 NOTE — Discharge Instructions (Addendum)
Call your primary care doctor or specialist as discussed in the next 2-3 days.   Return immediately back to the ER if:  Your symptoms worsen within the next 12-24 hours. You develop new symptoms such as new fevers, persistent vomiting, new pain, shortness of breath, or new weakness or numbness, or if you have any other concerns.  

## 2021-10-12 NOTE — ED Triage Notes (Signed)
Pt presents to ED with emesis, decreased appetite, hiccups x 1 month.

## 2021-10-12 NOTE — ED Provider Notes (Signed)
[facility] EMERGENCY DEPARTMENT Provider Note   CSN: [id number] Arrival date & time: 10/12/21  9629     History  Chief Complaint  Patient presents with   Emesis    Caleb Kim is a 61 y.o. male.  Patient is a chronic alcoholic presents to the ER with his ex-wife chief complaint of persistent hiccups and decreased appetite and generalized weakness.  Wife states that he is just been drinking and not doing anything else at home.  Patient reporting also nausea and vomiting and some diarrhea.  Denies headache or chest pain or abdominal pain.      Home Medications Prior to Admission medications   Medication Sig Start Date End Date Taking? Authorizing Provider  ondansetron (ZOFRAN-ODT) 4 MG disintegrating tablet Take 1 tablet (4 mg total) by mouth every 8 (eight) hours as needed for nausea or vomiting. 10/12/21  Yes Cheryll Cockayne, MD  sodium chloride 1 g tablet Take 1 tablet (1 g total) by mouth 2 (two) times daily with a meal for 2 days. 10/12/21 10/14/21 Yes Cheryll Cockayne, MD      Allergies    Patient has no known allergies.    Review of Systems   Review of Systems  Constitutional:  Negative for fever.  HENT:  Negative for ear pain and sore throat.   Eyes:  Negative for pain.  Respiratory:  Negative for cough.   Cardiovascular:  Negative for chest pain.  Gastrointestinal:  Positive for vomiting. Negative for abdominal pain.  Genitourinary:  Negative for flank pain.  Musculoskeletal:  Negative for back pain.  Skin:  Negative for color change and rash.  Neurological:  Negative for syncope.  All other systems reviewed and are negative.  Physical Exam Updated Vital Signs BP 112/82    Pulse 79    Temp 98.5 F (36.9 C) (Oral)    Resp 18    Ht 5\' 6"  (1.676 m)    Wt 61.2 kg    SpO2 95%    BMI 21.79 kg/m  Physical Exam Constitutional:      Appearance: He is well-developed.  HENT:     Head: Normocephalic.     Nose: Nose normal.  Eyes:     Extraocular Movements:  Extraocular movements intact.  Cardiovascular:     Rate and Rhythm: Normal rate.  Pulmonary:     Effort: Pulmonary effort is normal.  Abdominal:     Tenderness: There is no abdominal tenderness. There is no guarding or rebound.  Skin:    Coloration: Skin is not jaundiced.  Neurological:     General: No focal deficit present.     Mental Status: He is alert. Mental status is at baseline.     Cranial Nerves: No cranial nerve deficit.     Motor: No weakness.    ED Results / Procedures / Treatments   Labs (all labs ordered are listed, but only abnormal results are displayed) Labs Reviewed  CBC WITH DIFFERENTIAL/PLATELET - Abnormal; Notable for the following components:      Result Value   RBC 4.08 (*)    HCT 36.4 (*)    MCHC 36.5 (*)    RDW 11.1 (*)    Platelets 138 (*)    All other components within normal limits  BASIC METABOLIC PANEL - Abnormal; Notable for the following components:   Sodium 122 (*)    Potassium 3.1 (*)    Chloride 82 (*)    BUN <5 (*)    Creatinine, Ser 0.56 (*)  Calcium 8.8 (*)    All other components within normal limits  BASIC METABOLIC PANEL - Abnormal; Notable for the following components:   Sodium 129 (*)    Chloride 93 (*)    BUN <5 (*)    Creatinine, Ser 0.44 (*)    Calcium 8.2 (*)    All other components within normal limits  MAGNESIUM    EKG EKG Interpretation  Date/Time:  Sunday October 12 2021 07:36:15 EST Ventricular Rate:  77 PR Interval:  160 QRS Duration: 101 QT Interval:  418 QTC Calculation: 474 R Axis:   68 Text Interpretation: Sinus rhythm LVH with secondary repolarization abnormality Anterior ST elevation, probably due to LVH Baseline wander in lead(s) I II aVR aVL aVF Confirmed by Eugenia Eldredge ([id number]) on 10/12/2021 7:43:57 AM  Radiology No results found.  Procedures Procedures    Medications Ordered in ED Medications  sodium chloride 0.9 % bolus 1,000 mL (0 mLs Intravenous Stopped 10/12/21 0833)  thiamine tablet  100 mg (100 mg Oral Given 10/12/21 0741)  multivitamin with minerals tablet 1 tablet (1 tablet Oral Given 10/12/21 0741)  metoCLOPramide (REGLAN) injection 10 mg (10 mg Intravenous Given 10/12/21 0832)  potassium chloride SA (KLOR-CON M) CR tablet 40 mEq (40 mEq Oral Given 10/12/21 0832)  sodium chloride 0.9 % bolus 500 mL (0 mLs Intravenous Stopped 10/12/21 0933)  sodium chloride tablet 1 g (1 g Oral Given 10/12/21 0933)    ED Course/ Medical Decision Making/ A&P                           Medical Decision Making Amount and/or Complexity of Data Reviewed Labs: ordered.  Risk OTC drugs. Prescription drug management.   History obtained from the patient and his ex-wife at bedside.  Chart review shows prior visits for hyponatremia.  Labs are sent today sodium was low at 122.  Given IV fluid resuscitation and oral sodium replacement with improvement of sodium to 129.  Potassium replaced here as well with improvement.  Patient states he is feeling much better tolerating oral intake now.  Will be discharged home in stable condition advised outpatient follow-up with his doctor this week.  Advising immediate return for worsening symptoms or any additional concerns.        Final Clinical Impression(s) / ED Diagnoses Final diagnoses:  Alcohol abuse  Vomiting, unspecified vomiting type, unspecified whether nausea present  Hyponatremia    Rx / DC Orders ED Discharge Orders          Ordered    ondansetron (ZOFRAN-ODT) 4 MG disintegrating tablet  Every 8 hours PRN        10/12/21 1246    sodium chloride 1 g tablet  2 times daily with meals        02 /19/23 1246              06-13-2005, MD 10/12/21 1246

## 2021-10-12 NOTE — ED Notes (Signed)
Patient Alert and oriented to baseline. Stable and ambulatory to baseline. Patient verbalized understanding of the discharge instructions.  Patient belongings were taken by the patient.   

## 2022-01-31 ENCOUNTER — Emergency Department (HOSPITAL_COMMUNITY)
Admission: EM | Admit: 2022-01-31 | Discharge: 2022-01-31 | Disposition: A | Discharge status: Home / Self Care | Payer: 59 | Attending: Emergency Medicine | Admitting: Emergency Medicine

## 2022-01-31 ENCOUNTER — Other Ambulatory Visit: Payer: Self-pay

## 2022-01-31 ENCOUNTER — Encounter (HOSPITAL_COMMUNITY): Payer: Self-pay | Admitting: Emergency Medicine

## 2022-01-31 DIAGNOSIS — E871 Hypo-osmolality and hyponatremia: Secondary | ICD-10-CM | POA: Diagnosis not present

## 2022-01-31 DIAGNOSIS — R066 Hiccough: Secondary | ICD-10-CM | POA: Insufficient documentation

## 2022-01-31 DIAGNOSIS — Y906 Blood alcohol level of 120-199 mg/100 ml: Secondary | ICD-10-CM | POA: Diagnosis not present

## 2022-01-31 DIAGNOSIS — R112 Nausea with vomiting, unspecified: Secondary | ICD-10-CM | POA: Diagnosis present

## 2022-01-31 DIAGNOSIS — F1010 Alcohol abuse, uncomplicated: Secondary | ICD-10-CM | POA: Insufficient documentation

## 2022-01-31 LAB — COMPREHENSIVE METABOLIC PANEL
ALT: 68 U/L — ABNORMAL HIGH (ref 0–44)
AST: 131 U/L — ABNORMAL HIGH (ref 15–41)
Albumin: 3.6 g/dL (ref 3.5–5.0)
Alkaline Phosphatase: 108 U/L (ref 38–126)
Anion gap: 11 (ref 5–15)
BUN: <5 mg/dL — ABNORMAL LOW (ref 6–20)
CO2: 25 mmol/L (ref 22–32)
Calcium: 8.4 mg/dL — ABNORMAL LOW (ref 8.9–10.3)
Chloride: 84 mmol/L — ABNORMAL LOW (ref 98–111)
Creatinine, Ser: 0.57 mg/dL — ABNORMAL LOW (ref 0.61–1.24)
GFR, Estimated: >60 mL/min (ref >60)
Glucose, Bld: 94 mg/dL (ref 70–99)
Potassium: 3.1 mmol/L — ABNORMAL LOW (ref 3.5–5.1)
Sodium: 120 mmol/L — ABNORMAL LOW (ref 135–145)
Total Bilirubin: 1 mg/dL (ref 0.3–1.2)
Total Protein: 7.7 g/dL (ref 6.5–8.1)

## 2022-01-31 LAB — CBC WITH DIFFERENTIAL/PLATELET
Abs Immature Granulocytes: 0.01 10*3/uL (ref 0.00–0.07)
Basophils Absolute: 0 10*3/uL (ref 0.0–0.1)
Basophils Relative: 1 %
Eosinophils Absolute: 0 10*3/uL (ref 0.0–0.5)
Eosinophils Relative: 0 %
HCT: 39.7 % (ref 39.0–52.0)
Hemoglobin: 14.1 g/dL (ref 13.0–17.0)
Immature Granulocytes: 0 %
Lymphocytes Relative: 20 %
Lymphs Abs: 1.1 10*3/uL (ref 0.7–4.0)
MCH: 31.3 pg (ref 26.0–34.0)
MCHC: 35.5 g/dL (ref 30.0–36.0)
MCV: 88.2 fL (ref 80.0–100.0)
Monocytes Absolute: 0.6 10*3/uL (ref 0.1–1.0)
Monocytes Relative: 10 %
Neutro Abs: 3.8 10*3/uL (ref 1.7–7.7)
Neutrophils Relative %: 69 %
Platelets: 74 10*3/uL — ABNORMAL LOW (ref 150–400)
RBC: 4.5 MIL/uL (ref 4.22–5.81)
RDW: 11.2 % — ABNORMAL LOW (ref 11.5–15.5)
WBC: 5.6 10*3/uL (ref 4.0–10.5)
nRBC: 0 % (ref 0.0–0.2)

## 2022-01-31 LAB — BASIC METABOLIC PANEL
Anion gap: 8 (ref 5–15)
BUN: <5 mg/dL — ABNORMAL LOW (ref 6–20)
CO2: 23 mmol/L (ref 22–32)
Calcium: 7.9 mg/dL — ABNORMAL LOW (ref 8.9–10.3)
Chloride: 97 mmol/L — ABNORMAL LOW (ref 98–111)
Creatinine, Ser: 0.57 mg/dL — ABNORMAL LOW (ref 0.61–1.24)
GFR, Estimated: >60 mL/min (ref >60)
Glucose, Bld: 82 mg/dL (ref 70–99)
Potassium: 3.2 mmol/L — ABNORMAL LOW (ref 3.5–5.1)
Sodium: 128 mmol/L — ABNORMAL LOW (ref 135–145)

## 2022-01-31 LAB — ETHANOL: Alcohol, Ethyl (B): 146 mg/dL — ABNORMAL HIGH (ref <10)

## 2022-01-31 LAB — LIPASE, BLOOD: Lipase: 39 U/L (ref 11–51)

## 2022-01-31 MED ORDER — CHLORPROMAZINE HCL 25 MG/ML IJ SOLN
25.0000 mg | INTRAMUSCULAR | Status: AC
  Administered 2022-01-31: 25 mg via INTRAVENOUS
  Filled 2022-01-31: qty 1

## 2022-01-31 MED ORDER — SODIUM CHLORIDE 0.9 % IV BOLUS
1000.0000 mL | Freq: Once | INTRAVENOUS | Status: AC
  Administered 2022-01-31: 1000 mL via INTRAVENOUS

## 2022-01-31 MED ORDER — CHLORPROMAZINE HCL 25 MG/ML IJ SOLN: 50.0000 mg | Freq: Once | INTRAMUSCULAR | Status: DC

## 2022-01-31 NOTE — ED Provider Notes (Signed)
Patient signed out to me by previous provider. Please refer to their note for full HPI.  Briefly this is a 61 year old male who presented to the emergency department hiccups.  Noted to be hyponatremic, slightly worse than baseline.  Planning on IV hydration and repeat blood work. Physical Exam  BP (!) 159/95   Pulse 96   Temp 98 F (36.7 C) (Oral)   Resp (!) 21   Ht 5\' 6"  (1.676 m)   Wt 61.2 kg   SpO2 98%   BMI 21.79 kg/m   Physical Exam Vitals and nursing note reviewed.  Constitutional:      Appearance: Normal appearance.  HENT:     Head: Normocephalic.     Mouth/Throat:     Mouth: Mucous membranes are moist.  Cardiovascular:     Rate and Rhythm: Normal rate.  Pulmonary:     Effort: Pulmonary effort is normal. No respiratory distress.  Abdominal:     Palpations: Abdomen is soft.     Tenderness: There is no abdominal tenderness.  Skin:    General: Skin is warm.  Neurological:     Mental Status: He is alert and oriented to person, place, and time. Mental status is at baseline.  Psychiatric:        Mood and Affect: Mood normal.     Procedures  Procedures  ED Course / MDM    Medical Decision Making Amount and/or Complexity of Data Reviewed Labs: ordered.   After IV fluids repeat sodium is 128.  Patient is sitting up, able to eat and drink, with significant other at bedside.  Both requesting discharge.  Patient at this time appears safe and stable for discharge and close outpatient follow up. Discharge plan and strict return to ED precautions discussed, patient verbalizes understanding and agreement.       Lorelle Gibbs, DO 01/31/22 1338

## 2022-01-31 NOTE — ED Triage Notes (Signed)
Pt here for hiccups and vomiting x 2-3 days. Wife states his abdomen appears distended to her and that he has a couple of swollen areas to both shoulders. Wife states that the last time the pt had these symptoms; pt was admitted to ICU.

## 2022-01-31 NOTE — ED Provider Notes (Signed)
[facility] EMERGENCY DEPARTMENT Provider Note   CSN: [id number] Arrival date & time: 01/31/22  0350     History  Chief Complaint  Patient presents with   Hiccups   Emesis    Caleb Kim is a 61 y.o. male.  Patient presents to the emergency department for evaluation of nausea and vomiting.  Patient has been experiencing vomiting with hiccups for the last couple of days.  This has been a chronic and recurrent problem for the patient.  Wife also concerned that his abdomen and shoulder areas may be swollen.  No difficulty breathing.  No abdominal pain.       Home Medications Prior to Admission medications   Medication Sig Start Date End Date Taking? Authorizing Provider  ondansetron (ZOFRAN-ODT) 4 MG disintegrating tablet Take 1 tablet (4 mg total) by mouth every 8 (eight) hours as needed for nausea or vomiting. 10/12/21   Luna Fuse, MD      Allergies    Patient has no known allergies.    Review of Systems   Review of Systems  Gastrointestinal:  Positive for nausea and vomiting.    Physical Exam Updated Vital Signs BP 137/72   Pulse 91   Temp 98 F (36.7 C) (Oral)   Resp 17   Ht 5' 6"  (1.676 m)   Wt 61.2 kg   SpO2 98%   BMI 21.79 kg/m  Physical Exam Vitals and nursing note reviewed.  Constitutional:      General: He is not in acute distress.    Appearance: He is well-developed.  HENT:     Head: Normocephalic and atraumatic.     Mouth/Throat:     Mouth: Mucous membranes are moist.  Eyes:     General: Vision grossly intact. Gaze aligned appropriately.     Extraocular Movements: Extraocular movements intact.     Conjunctiva/sclera: Conjunctivae normal.  Neck:     Comments: Symmetric fullness at the base of the neck laterally, no tenderness. Cardiovascular:     Rate and Rhythm: Normal rate and regular rhythm.     Pulses: Normal pulses.     Heart sounds: Normal heart sounds, S1 normal and S2 normal. No murmur heard.    No friction rub. No gallop.   Pulmonary:     Effort: Pulmonary effort is normal. No respiratory distress.     Breath sounds: Normal breath sounds.  Abdominal:     Palpations: Abdomen is soft.     Tenderness: There is no abdominal tenderness. There is no guarding or rebound.     Hernia: No hernia is present.  Musculoskeletal:        General: No swelling.     Cervical back: Full passive range of motion without pain, normal range of motion and neck supple. No pain with movement, spinous process tenderness or muscular tenderness. Normal range of motion.     Right lower leg: No edema.     Left lower leg: No edema.  Skin:    General: Skin is warm and dry.     Capillary Refill: Capillary refill takes less than 2 seconds.     Findings: No ecchymosis, erythema, lesion or wound.  Neurological:     Mental Status: He is alert and oriented to person, place, and time.     GCS: GCS eye subscore is 4. GCS verbal subscore is 5. GCS motor subscore is 6.     Cranial Nerves: Cranial nerves 2-12 are intact.     Sensory: Sensation is intact.  Motor: Motor function is intact. No weakness or abnormal muscle tone.     Coordination: Coordination is intact.  Psychiatric:        Mood and Affect: Mood normal.        Speech: Speech normal.        Behavior: Behavior normal.     ED Results / Procedures / Treatments   Labs (all labs ordered are listed, but only abnormal results are displayed) Labs Reviewed  CBC WITH DIFFERENTIAL/PLATELET - Abnormal; Notable for the following components:      Result Value   RDW 11.2 (*)    Platelets 74 (*)    All other components within normal limits  COMPREHENSIVE METABOLIC PANEL - Abnormal; Notable for the following components:   Sodium 120 (*)    Potassium 3.1 (*)    Chloride 84 (*)    BUN <5 (*)    Creatinine, Ser 0.57 (*)    Calcium 8.4 (*)    AST 131 (*)    ALT 68 (*)    All other components within normal limits  ETHANOL - Abnormal; Notable for the following components:   Alcohol,  Ethyl (B) 146 (*)    All other components within normal limits  LIPASE, BLOOD    EKG EKG Interpretation  Date/Time:  Saturday January 31 2022 04:31:18 EDT Ventricular Rate:  83 PR Interval:  157 QRS Duration: 94 QT Interval:  409 QTC Calculation: 481 R Axis:   49 Text Interpretation: Sinus rhythm Probable left atrial enlargement Left ventricular hypertrophy Borderline T abnormalities, inferior leads Borderline prolonged QT interval Confirmed by Orpah Greek [phone]) on 01/31/2022 4:33:56 AM  Radiology No results found.  Procedures Procedures    Medications Ordered in ED Medications  chlorproMAZINE (THORAZINE) 25 mg in sodium chloride 0.9 % 25 mL IVPB (has no administration in time range)  sodium chloride 0.9 % bolus 1,000 mL (0 mLs Intravenous Stopped 01/31/22 0706)    ED Course/ Medical Decision Making/ A&P                           Medical Decision Making Amount and/or Complexity of Data Reviewed Labs: ordered.   Patient presents to the emergency department for evaluation of nausea, vomiting, hiccups.  Patient has a history of chronic recurring hiccups and vomiting secondary to chronic alcohol abuse.  He also has a history of chronic hyponatremia, likely secondary to free water intake with his large amounts of beer.  Patient is in no distress.  He is noted to be hyponatremic.  He had a similar presentation earlier in the year and responded to IV fluids and was able to be discharged.  Patient is receiving IV fluids.  Will sign out to oncoming ER physician to recheck be met.  If significant improvement, can be discharged.        Final Clinical Impression(s) / ED Diagnoses Final diagnoses:  Hiccups  Alcohol abuse  Hyponatremia    Rx / DC Orders ED Discharge Orders     None         Maylani Embree, Gwenyth Allegra, MD 01/31/22 [phone]

## 2022-01-31 NOTE — ED Notes (Signed)
This RN already attempted x2 to start IV on patient earlier this morning; patient called out because he removed IV placed by Herbert Seta, RN.

## 2022-04-10 ENCOUNTER — Encounter (HOSPITAL_COMMUNITY): Payer: Self-pay

## 2022-04-10 ENCOUNTER — Other Ambulatory Visit: Payer: Self-pay

## 2022-04-10 ENCOUNTER — Emergency Department (HOSPITAL_COMMUNITY)
Admission: EM | Admit: 2022-04-10 | Discharge: 2022-04-11 | Disposition: A | Discharge status: Home / Self Care | Payer: 59 | Attending: Emergency Medicine | Admitting: Emergency Medicine

## 2022-04-10 DIAGNOSIS — F1090 Alcohol use, unspecified, uncomplicated: Secondary | ICD-10-CM | POA: Diagnosis present

## 2022-04-10 DIAGNOSIS — Y907 Blood alcohol level of 200-239 mg/100 ml: Secondary | ICD-10-CM | POA: Diagnosis not present

## 2022-04-10 DIAGNOSIS — F1020 Alcohol dependence, uncomplicated: Secondary | ICD-10-CM | POA: Insufficient documentation

## 2022-04-10 DIAGNOSIS — Z79899 Other long term (current) drug therapy: Secondary | ICD-10-CM | POA: Diagnosis not present

## 2022-04-10 DIAGNOSIS — F10920 Alcohol use, unspecified with intoxication, uncomplicated: Secondary | ICD-10-CM

## 2022-04-10 DIAGNOSIS — R030 Elevated blood-pressure reading, without diagnosis of hypertension: Secondary | ICD-10-CM | POA: Diagnosis not present

## 2022-04-10 LAB — CBC WITH DIFFERENTIAL/PLATELET
Abs Immature Granulocytes: 0.02 10*3/uL (ref 0.00–0.07)
Basophils Absolute: 0.1 10*3/uL (ref 0.0–0.1)
Basophils Relative: 1 %
Eosinophils Absolute: 0 10*3/uL (ref 0.0–0.5)
Eosinophils Relative: 0 %
HCT: 42.1 % (ref 39.0–52.0)
Hemoglobin: 14.3 g/dL (ref 13.0–17.0)
Immature Granulocytes: 0 %
Lymphocytes Relative: 33 %
Lymphs Abs: 1.7 10*3/uL (ref 0.7–4.0)
MCH: 31.4 pg (ref 26.0–34.0)
MCHC: 34 g/dL (ref 30.0–36.0)
MCV: 92.3 fL (ref 80.0–100.0)
Monocytes Absolute: 0.6 10*3/uL (ref 0.1–1.0)
Monocytes Relative: 12 %
Neutro Abs: 2.7 10*3/uL (ref 1.7–7.7)
Neutrophils Relative %: 54 %
Platelets: 202 10*3/uL (ref 150–400)
RBC: 4.56 MIL/uL (ref 4.22–5.81)
RDW: 13 % (ref 11.5–15.5)
WBC: 5.1 10*3/uL (ref 4.0–10.5)
nRBC: 0 % (ref 0.0–0.2)

## 2022-04-10 LAB — RAPID URINE DRUG SCREEN, HOSP PERFORMED
Amphetamines: NOT DETECTED
Barbiturates: NOT DETECTED
Benzodiazepines: NOT DETECTED
Cocaine: NOT DETECTED
Opiates: NOT DETECTED
Tetrahydrocannabinol: NOT DETECTED

## 2022-04-10 LAB — BASIC METABOLIC PANEL
Anion gap: 12 (ref 5–15)
BUN: <5 mg/dL — ABNORMAL LOW (ref 6–20)
CO2: 22 mmol/L (ref 22–32)
Calcium: 8.7 mg/dL — ABNORMAL LOW (ref 8.9–10.3)
Chloride: 98 mmol/L (ref 98–111)
Creatinine, Ser: 0.64 mg/dL (ref 0.61–1.24)
GFR, Estimated: >60 mL/min (ref >60)
Glucose, Bld: 90 mg/dL (ref 70–99)
Potassium: 3.9 mmol/L (ref 3.5–5.1)
Sodium: 132 mmol/L — ABNORMAL LOW (ref 135–145)

## 2022-04-10 LAB — ETHANOL: Alcohol, Ethyl (B): 230 mg/dL — ABNORMAL HIGH (ref <10)

## 2022-04-10 LAB — SALICYLATE LEVEL: Salicylate Lvl: <7 mg/dL — ABNORMAL LOW (ref 7.0–30.0)

## 2022-04-10 NOTE — ED Provider Notes (Signed)
[facility] EMERGENCY DEPARTMENT Provider Note   CSN: [id number] Arrival date & time: 04/10/22  2124     History {Add pertinent medical, surgical, social history, OB history to HPI:1} Chief Complaint  Patient presents with   Medical Clearance    Claudis Giovanelli is a 61 y.o. male.  Patient presents w etoh intoxication and brother took out ivc due to etoh use disorder.  Pt acknowledges stresses in past year related to breakup of relationship and foreclosure on home, but denies acute feeling worse or more depressed about those issues. Pt indicates is eating/drinking, normal appetite. Is sleeping at night. Denies thoughts of harm to self or others. States would be willing to pursue outpatient treatment, but does not feel he needs inpatient treatment. States physical health at baseline, denies new c/o. W etoh use, denies complicated etoh withdrawal, denies szs or dts.   The history is provided by the patient, medical records and the police.       Home Medications Prior to Admission medications   Not on File      Allergies    Patient has no known allergies.    Review of Systems   Review of Systems  Constitutional:  Negative for fever.  HENT:  Negative for sore throat.   Eyes:  Negative for redness.  Respiratory:  Negative for cough and shortness of breath.   Cardiovascular:  Negative for chest pain.  Gastrointestinal:  Negative for abdominal pain and vomiting.  Genitourinary:  Negative for flank pain.  Musculoskeletal:  Negative for back pain and neck pain.  Skin:  Negative for rash.  Neurological:  Negative for headaches.  Hematological:  Does not bruise/bleed easily.  Psychiatric/Behavioral:  Negative for confusion and suicidal ideas.     Physical Exam Updated Vital Signs BP (!) 151/103 (BP Location: Right Arm)   Pulse 68   Temp 98.4 F (36.9 C) (Oral)   Resp 18   Ht 1.676 m (5\' 6" )   Wt 61.2 kg   SpO2 96%   BMI 21.79 kg/m  Physical Exam Vitals and nursing note  reviewed.  Constitutional:      Appearance: Normal appearance. He is well-developed.  HENT:     Head: Atraumatic.     Nose: Nose normal.     Mouth/Throat:     Mouth: Mucous membranes are moist.     Pharynx: Oropharynx is clear.  Eyes:     General: No scleral icterus.    Conjunctiva/sclera: Conjunctivae normal.     Pupils: Pupils are equal, round, and reactive to light.  Neck:     Trachea: No tracheal deviation.  Cardiovascular:     Rate and Rhythm: Normal rate and regular rhythm.     Pulses: Normal pulses.     Heart sounds: Normal heart sounds. No murmur heard.    No friction rub. No gallop.  Pulmonary:     Effort: Pulmonary effort is normal. No accessory muscle usage or respiratory distress.     Breath sounds: Normal breath sounds.  Abdominal:     General: Bowel sounds are normal. There is no distension.     Palpations: Abdomen is soft.     Tenderness: There is no abdominal tenderness. There is no guarding.  Genitourinary:    Comments: No cva tenderness. Musculoskeletal:        General: No swelling.     Cervical back: Normal range of motion and neck supple. No rigidity.  Skin:    General: Skin is warm and dry.  Findings: No rash.  Neurological:     Mental Status: He is alert.     Comments: Alert, speech clear. Steady gait.   Psychiatric:        Mood and Affect: Mood normal.     Comments: Normal mood and affect. Pt does not appear acutely depressed. No SI/HI.  Pt is not responding to internal stimuli - no delusions or hallucinations noted.      ED Results / Procedures / Treatments   Labs (all labs ordered are listed, but only abnormal results are displayed) Results for orders placed or performed during the hospital encounter of 04/10/22  Basic metabolic panel  Result Value Ref Range   Sodium 132 (L) 135 - 145 mmol/L   Potassium 3.9 3.5 - 5.1 mmol/L   Chloride 98 98 - 111 mmol/L   CO2 22 22 - 32 mmol/L   Glucose, Bld 90 70 - 99 mg/dL   BUN <5 (L) 6 - 20 mg/dL    Creatinine, Ser 7.34 0.61 - 1.24 mg/dL   Calcium 8.7 (L) 8.9 - 10.3 mg/dL   GFR, Estimated >19 >37 mL/min   Anion gap 12 5 - 15  CBC with Differential  Result Value Ref Range   WBC 5.1 4.0 - 10.5 K/uL   RBC 4.56 4.22 - 5.81 MIL/uL   Hemoglobin 14.3 13.0 - 17.0 g/dL   HCT 90.2 40.9 - 73.5 %   MCV 92.3 80.0 - 100.0 fL   MCH 31.4 26.0 - 34.0 pg   MCHC 34.0 30.0 - 36.0 g/dL   RDW 32.9 92.4 - 26.8 %   Platelets 202 150 - 400 K/uL   nRBC 0.0 0.0 - 0.2 %   Neutrophils Relative % 54 %   Neutro Abs 2.7 1.7 - 7.7 K/uL   Lymphocytes Relative 33 %   Lymphs Abs 1.7 0.7 - 4.0 K/uL   Monocytes Relative 12 %   Monocytes Absolute 0.6 0.1 - 1.0 K/uL   Eosinophils Relative 0 %   Eosinophils Absolute 0.0 0.0 - 0.5 K/uL   Basophils Relative 1 %   Basophils Absolute 0.1 0.0 - 0.1 K/uL   Immature Granulocytes 0 %   Abs Immature Granulocytes 0.02 0.00 - 0.07 K/uL  Ethanol  Result Value Ref Range   Alcohol, Ethyl (B) 230 (H) <10 mg/dL  Salicylate level  Result Value Ref Range   Salicylate Lvl <7.0 (L) 7.0 - 30.0 mg/dL      EKG None  Radiology No results found.  Procedures Procedures  {Document cardiac monitor, telemetry assessment procedure when appropriate:1}  Medications Ordered in ED Medications - No data to display  ED Course/ Medical Decision Making/ A&P                           Medical Decision Making Amount and/or Complexity of Data Reviewed Labs: ordered.   Iv ns. Continuous pulse ox and cardiac monitoring. Labs ordered/sent.    Reviewed nursing notes and prior charts for additional history. External reports reviewed. Additional history from: [facility].   Cardiac monitor: sinus rhythm, rate 68.  Labs reviewed/interpreted by me - etoh elevated.   Will let metabolize etoh and reassess.   Po fluids/food.   On recheck, pt continues to deny any thoughts of harm to self or others. No acute psychosis on exam. Pt expressing willingness to f/u as outpatient and does not  feel inpatient tx would be helpful, and indicates he feels it would make things  potentially worse.      {Document critical care time when appropriate:1} {Document review of labs and clinical decision tools ie heart score, Chads2Vasc2 etc:1}  {Document your independent review of radiology images, and any outside records:1} {Document your discussion with family members, caretakers, and with consultants:1} {Document social determinants of health affecting pt's care:1} {Document your decision making why or why not admission, treatments were needed:1} Final Clinical Impression(s) / ED Diagnoses Final diagnoses:  None    Rx / DC Orders ED Discharge Orders     None

## 2022-04-10 NOTE — ED Triage Notes (Signed)
Pt reports consuming (2) 40oz beers today and he reports he has been staying at his mothers house here in [place].

## 2022-04-10 NOTE — ED Triage Notes (Signed)
Pt brought in by [facility] under IVC for medical evaluation. Pts brother IVC's Pt reporting Pt has ETOH addiction and has been unable to care for himself. Pt reports depression due to recent foreclosure on home, separation from wife, unemployment. Pt denies SI/HI. Pt denies A/V/H. Pt cooperative and calm during Triage. Pts gait is steady. Pt A+O X 4.

## 2022-04-11 NOTE — Discharge Instructions (Addendum)
It was our pleasure to provide your ER care today - we hope that you feel better.  Avoid alcohol use, as it is harmful to your physical health and mental well-being. Make sure to never drink and drive.   See resource guide provide for alcohol treatment programs, as well as for outpatient behavioral health counseling and other support services.   Also follow up closely with primary  care doctor in the coming week - have your blood pressure rechecked  then, as it is high today.  Return to ER if worse, new symptoms, fevers, chest pain, trouble breathing, or other emergency concern.

## 2022-05-14 ENCOUNTER — Encounter (HOSPITAL_COMMUNITY): Payer: Self-pay

## 2022-05-14 ENCOUNTER — Emergency Department (HOSPITAL_COMMUNITY): Payer: Medicaid Other

## 2022-05-14 ENCOUNTER — Other Ambulatory Visit: Payer: Self-pay

## 2022-05-14 ENCOUNTER — Inpatient Hospital Stay (HOSPITAL_COMMUNITY)
Admission: EM | Admit: 2022-05-14 | Discharge: 2022-05-21 | DRG: 640 | Disposition: A | Discharge status: Home Health | Payer: Medicaid Other | Attending: Family Medicine | Admitting: Family Medicine

## 2022-05-14 DIAGNOSIS — F10231 Alcohol dependence with withdrawal delirium: Secondary | ICD-10-CM | POA: Diagnosis not present

## 2022-05-14 DIAGNOSIS — R1111 Vomiting without nausea: Secondary | ICD-10-CM | POA: Diagnosis not present

## 2022-05-14 DIAGNOSIS — K7010 Alcoholic hepatitis without ascites: Secondary | ICD-10-CM | POA: Diagnosis present

## 2022-05-14 DIAGNOSIS — E871 Hypo-osmolality and hyponatremia: Principal | ICD-10-CM | POA: Diagnosis present

## 2022-05-14 DIAGNOSIS — J690 Pneumonitis due to inhalation of food and vomit: Secondary | ICD-10-CM | POA: Diagnosis not present

## 2022-05-14 DIAGNOSIS — R9431 Abnormal electrocardiogram [ECG] [EKG]: Secondary | ICD-10-CM | POA: Diagnosis not present

## 2022-05-14 DIAGNOSIS — E878 Other disorders of electrolyte and fluid balance, not elsewhere classified: Secondary | ICD-10-CM | POA: Diagnosis present

## 2022-05-14 DIAGNOSIS — Z720 Tobacco use: Secondary | ICD-10-CM | POA: Diagnosis not present

## 2022-05-14 DIAGNOSIS — E876 Hypokalemia: Secondary | ICD-10-CM | POA: Diagnosis present

## 2022-05-14 DIAGNOSIS — R69 Illness, unspecified: Secondary | ICD-10-CM | POA: Diagnosis not present

## 2022-05-14 DIAGNOSIS — F17210 Nicotine dependence, cigarettes, uncomplicated: Secondary | ICD-10-CM | POA: Diagnosis present

## 2022-05-14 DIAGNOSIS — R066 Hiccough: Secondary | ICD-10-CM | POA: Diagnosis present

## 2022-05-14 DIAGNOSIS — R1110 Vomiting, unspecified: Secondary | ICD-10-CM | POA: Diagnosis present

## 2022-05-14 DIAGNOSIS — F17290 Nicotine dependence, other tobacco product, uncomplicated: Secondary | ICD-10-CM | POA: Diagnosis present

## 2022-05-14 DIAGNOSIS — E860 Dehydration: Secondary | ICD-10-CM | POA: Diagnosis not present

## 2022-05-14 DIAGNOSIS — R159 Full incontinence of feces: Secondary | ICD-10-CM | POA: Diagnosis not present

## 2022-05-14 DIAGNOSIS — J189 Pneumonia, unspecified organism: Secondary | ICD-10-CM | POA: Diagnosis not present

## 2022-05-14 DIAGNOSIS — I10 Essential (primary) hypertension: Secondary | ICD-10-CM | POA: Diagnosis not present

## 2022-05-14 DIAGNOSIS — Y900 Blood alcohol level of less than 20 mg/100 ml: Secondary | ICD-10-CM | POA: Diagnosis present

## 2022-05-14 DIAGNOSIS — F1010 Alcohol abuse, uncomplicated: Secondary | ICD-10-CM

## 2022-05-14 LAB — BASIC METABOLIC PANEL
Anion gap: 17 — ABNORMAL HIGH (ref 5–15)
BUN: 7 mg/dL (ref 6–20)
CO2: 25 mmol/L (ref 22–32)
Calcium: 8.2 mg/dL — ABNORMAL LOW (ref 8.9–10.3)
Chloride: 75 mmol/L — ABNORMAL LOW (ref 98–111)
Creatinine, Ser: 0.55 mg/dL — ABNORMAL LOW (ref 0.61–1.24)
GFR, Estimated: >60 mL/min (ref >60)
Glucose, Bld: 84 mg/dL (ref 70–99)
Potassium: 3.9 mmol/L (ref 3.5–5.1)
Sodium: 117 mmol/L — CL (ref 135–145)

## 2022-05-14 LAB — COMPREHENSIVE METABOLIC PANEL
ALT: 72 U/L — ABNORMAL HIGH (ref 0–44)
AST: 135 U/L — ABNORMAL HIGH (ref 15–41)
Albumin: 3.4 g/dL — ABNORMAL LOW (ref 3.5–5.0)
Alkaline Phosphatase: 115 U/L (ref 38–126)
Anion gap: 15 (ref 5–15)
BUN: 7 mg/dL (ref 6–20)
CO2: 28 mmol/L (ref 22–32)
Calcium: 8.7 mg/dL — ABNORMAL LOW (ref 8.9–10.3)
Chloride: 72 mmol/L — ABNORMAL LOW (ref 98–111)
Creatinine, Ser: 0.59 mg/dL — ABNORMAL LOW (ref 0.61–1.24)
GFR, Estimated: >60 mL/min (ref >60)
Glucose, Bld: 101 mg/dL — ABNORMAL HIGH (ref 70–99)
Potassium: 3.4 mmol/L — ABNORMAL LOW (ref 3.5–5.1)
Sodium: 115 mmol/L — CL (ref 135–145)
Total Bilirubin: 1.6 mg/dL — ABNORMAL HIGH (ref 0.3–1.2)
Total Protein: 8.2 g/dL — ABNORMAL HIGH (ref 6.5–8.1)

## 2022-05-14 LAB — URINALYSIS, ROUTINE W REFLEX MICROSCOPIC
Bilirubin Urine: NEGATIVE
Glucose, UA: NEGATIVE mg/dL
Hgb urine dipstick: NEGATIVE
Ketones, ur: 5 mg/dL — AB
Leukocytes,Ua: NEGATIVE
Nitrite: NEGATIVE
Protein, ur: NEGATIVE mg/dL
Specific Gravity, Urine: 1.011 (ref 1.005–1.030)
pH: 6 (ref 5.0–8.0)

## 2022-05-14 LAB — ETHANOL: Alcohol, Ethyl (B): 11 mg/dL — ABNORMAL HIGH (ref <10)

## 2022-05-14 LAB — CBC
HCT: 40.4 % (ref 39.0–52.0)
Hemoglobin: 14.5 g/dL (ref 13.0–17.0)
MCH: 30.6 pg (ref 26.0–34.0)
MCHC: 35.9 g/dL (ref 30.0–36.0)
MCV: 85.2 fL (ref 80.0–100.0)
Platelets: 218 10*3/uL (ref 150–400)
RBC: 4.74 MIL/uL (ref 4.22–5.81)
RDW: 11.6 % (ref 11.5–15.5)
WBC: 9.5 10*3/uL (ref 4.0–10.5)
nRBC: 0 % (ref 0.0–0.2)

## 2022-05-14 LAB — LIPASE, BLOOD: Lipase: 35 U/L (ref 11–51)

## 2022-05-14 LAB — MAGNESIUM: Magnesium: 2.1 mg/dL (ref 1.7–2.4)

## 2022-05-14 MED ORDER — ALUM & MAG HYDROXIDE-SIMETH 200-200-20 MG/5ML PO SUSP
30.0000 mL | Freq: Once | ORAL | Status: AC
  Administered 2022-05-14: 30 mL via ORAL
  Filled 2022-05-14: qty 30

## 2022-05-14 MED ORDER — POTASSIUM CHLORIDE CRYS ER 20 MEQ PO TBCR
20.0000 meq | EXTENDED_RELEASE_TABLET | Freq: Once | ORAL | Status: AC
  Administered 2022-05-14: 20 meq via ORAL
  Filled 2022-05-14: qty 1

## 2022-05-14 MED ORDER — ONDANSETRON HCL 4 MG/2ML IJ SOLN
4.0000 mg | Freq: Once | INTRAMUSCULAR | Status: AC
  Administered 2022-05-14: 4 mg via INTRAVENOUS
  Filled 2022-05-14: qty 2

## 2022-05-14 MED ORDER — SODIUM CHLORIDE 0.9 % IV BOLUS
1000.0000 mL | Freq: Once | INTRAVENOUS | Status: AC
  Administered 2022-05-14: 1000 mL via INTRAVENOUS

## 2022-05-14 MED ORDER — FAMOTIDINE IN NACL 20-0.9 MG/50ML-% IV SOLN
20.0000 mg | Freq: Once | INTRAVENOUS | Status: AC
  Administered 2022-05-14: 20 mg via INTRAVENOUS
  Filled 2022-05-14: qty 50

## 2022-05-14 NOTE — ED Provider Notes (Signed)
[facility] EMERGENCY DEPARTMENT Provider Note   CSN: [id number] Arrival date & time: 05/14/22  1416     History  Chief Complaint  Patient presents with   Emesis    Caleb Kim is a 61 y.o. male.  Pt is a 61 yo male with a pmhx significant for etoh abuse.  He presents to the ED today with intermittent hiccups and vomiting.  He tells me he just drinks 3 cans of beer per day.  Pt denies any pain.  No fevers.       Home Medications Prior to Admission medications   Not on File      Allergies    Patient has no known allergies.    Review of Systems   Review of Systems  Gastrointestinal:  Positive for nausea and vomiting.       Hiccups  All other systems reviewed and are negative.   Physical Exam Updated Vital Signs BP 132/88 (BP Location: Right Arm)   Pulse 87   Temp 98.6 F (37 C) (Oral)   Resp 20   Ht 5\' 6"  (1.676 m)   Wt 61.2 kg   SpO2 99%   BMI 21.79 kg/m  Physical Exam Vitals and nursing note reviewed.  Constitutional:      Appearance: Normal appearance.  HENT:     Head: Normocephalic and atraumatic.     Right Ear: External ear normal.     Left Ear: External ear normal.     Nose: Nose normal.     Mouth/Throat:     Mouth: Mucous membranes are moist.     Pharynx: Oropharynx is clear.  Eyes:     Extraocular Movements: Extraocular movements intact.     Conjunctiva/sclera: Conjunctivae normal.     Pupils: Pupils are equal, round, and reactive to light.  Cardiovascular:     Rate and Rhythm: Normal rate and regular rhythm.     Pulses: Normal pulses.     Heart sounds: Normal heart sounds.  Pulmonary:     Effort: Pulmonary effort is normal.     Breath sounds: Normal breath sounds.  Abdominal:     General: Abdomen is flat. Bowel sounds are normal.     Palpations: Abdomen is soft.  Musculoskeletal:        General: Normal range of motion.     Cervical back: Normal range of motion and neck supple.  Skin:    General: Skin is warm.     Capillary  Refill: Capillary refill takes less than 2 seconds.  Neurological:     General: No focal deficit present.     Mental Status: He is alert and oriented to person, place, and time.  Psychiatric:        Mood and Affect: Mood normal.        Behavior: Behavior normal.     ED Results / Procedures / Treatments   Labs (all labs ordered are listed, but only abnormal results are displayed) Labs Reviewed  COMPREHENSIVE METABOLIC PANEL - Abnormal; Notable for the following components:      Result Value   Sodium 115 (*)    Potassium 3.4 (*)    Chloride 72 (*)    Glucose, Bld 101 (*)    Creatinine, Ser 0.59 (*)    Calcium 8.7 (*)    Total Protein 8.2 (*)    Albumin 3.4 (*)    AST 135 (*)    ALT 72 (*)    Total Bilirubin 1.6 (*)    All  other components within normal limits  URINALYSIS, ROUTINE W REFLEX MICROSCOPIC - Abnormal; Notable for the following components:   Ketones, ur 5 (*)    All other components within normal limits  ETHANOL - Abnormal; Notable for the following components:   Alcohol, Ethyl (B) 11 (*)    All other components within normal limits  BASIC METABOLIC PANEL - Abnormal; Notable for the following components:   Sodium 117 (*)    Chloride 75 (*)    Creatinine, Ser 0.55 (*)    Calcium 8.2 (*)    Anion gap 17 (*)    All other components within normal limits  LIPASE, BLOOD  CBC  MAGNESIUM    EKG EKG Interpretation  Date/Time:  Thursday May 14 2022 18:31:46 EDT Ventricular Rate:  80 PR Interval:  149 QRS Duration: 94 QT Interval:  494 QTC Calculation: 570 R Axis:   47 Text Interpretation: Sinus rhythm Biatrial enlargement Probable left ventricular hypertrophy Prolonged QT interval Baseline wander in lead(s) V2 No significant change since last tracing Confirmed by Jacalyn Lefevre [phone]) on 05/14/2022 9:05:38 PM  Radiology DG Chest Portable 1 View  Result Date: 05/14/2022 CLINICAL DATA:  hyponatremia EXAM: PORTABLE CHEST 1 VIEW COMPARISON:  Chest x-ray  12/31/2019 FINDINGS: The heart and mediastinal contours are within normal limits. No focal consolidation. No pulmonary edema. No pleural effusion. No pneumothorax. No acute osseous abnormality. IMPRESSION: No active disease. Electronically Signed   By: Tish Frederickson M.D.   On: 05/14/2022 18:50    Procedures Procedures    Medications Ordered in ED Medications  sodium chloride 0.9 % bolus 1,000 mL (0 mLs Intravenous Stopped 05/14/22 1921)  ondansetron (ZOFRAN) injection 4 mg (4 mg Intravenous Given 05/14/22 1844)  famotidine (PEPCID) IVPB 20 mg premix (0 mg Intravenous Stopped 05/14/22 1921)  potassium chloride SA (KLOR-CON M) CR tablet 20 mEq (20 mEq Oral Given 05/14/22 1844)  sodium chloride 0.9 % bolus 1,000 mL (1,000 mLs Intravenous New Bag/Given 05/14/22 2047)  alum & mag hydroxide-simeth (MAALOX/MYLANTA) 200-200-20 MG/5ML suspension 30 mL (30 mLs Oral Given 05/14/22 2044)    ED Course/ Medical Decision Making/ A&P                           Medical Decision Making Amount and/or Complexity of Data Reviewed Labs: ordered. Radiology: ordered.  Risk OTC drugs. Prescription drug management. Decision regarding hospitalization.   This patient presents to the ED for concern of weakness, this involves an extensive number of treatment options, and is a complaint that carries with it a high risk of complications and morbidity.  The differential diagnosis includes electrolyte abn, infection, etoh intox   Co morbidities that complicate the patient evaluation  Etoh abuse   Additional history obtained:  Additional history obtained from epic chart review    Lab Tests:  I Ordered, and personally interpreted labs.  The pertinent results include:  cbc nl, cmp with na low at 115 and k slightly low at 3.4; LFTs elevated with AST 135 and ALT 72 (c/w etoh abuse)   Imaging Studies ordered:  I ordered imaging studies including cxr  I independently visualized and interpreted imaging which  showed  IMPRESSION:  No active disease.   I agree with the radiologist interpretation   Cardiac Monitoring:  The patient was maintained on a cardiac monitor.  I personally viewed and interpreted the cardiac monitored which showed an underlying rhythm of: nsr   Medicines ordered and prescription drug management:  I ordered medication including IVFs  for hyponatremia  Reevaluation of the patient after these medicines showed that the patient improved I have reviewed the patients home medicines and have made adjustments as needed   Test Considered:  ct   Critical Interventions:  IVFs   Consultations Obtained:  I requested consultation with the hospitalist (Dr. Josephine Cables),  and discussed lab and imaging findings as well as pertinent plan - he will admit   Problem List / ED Course:  Hyponatremia:  initial Na 115.  Pt does have a hx of hyponatremia, but it is usually in the upper 120s.  Pt given 1L NS which only increased Na to 117.  Pt given an additional L and d/w Dr. Josephine Cables for admission. Hiccups:  likely due to gastritis from etoh abuse.  Pt given pepcid and a gi cocktail.  Pt will need to see GI. Hypokalemia:  pt has mild hypokalemia.  Pt is given 20 meq po. Etoh Abuse:  no withdrawal sx now.  However, pt will need to be on CIWA protocol during admission.   Reevaluation:  After the interventions noted above, I reevaluated the patient and found that they have :improved   Social Determinants of Health:  Lives at home   Dispostion:  After consideration of the diagnostic results and the patients response to treatment, I feel that the patent would benefit from admission.          Final Clinical Impression(s) / ED Diagnoses Final diagnoses:  Hyponatremia  Hiccups    Rx / DC Orders ED Discharge Orders     None         Isla Pence, MD 05/14/22 2109

## 2022-05-14 NOTE — H&P (Addendum)
History and Physical    Patient: Caleb Kim XBJ:[id number] DOB: 10-Sep-1960 DOA: 05/14/2022 DOS: the patient was seen and examined on 05/15/2022 PCP: Patient, No Pcp Per  Patient coming from: Home  Chief Complaint:  Chief Complaint  Patient presents with   Emesis   HPI: Caleb Kim is a 61 y.o. male with medical history significant of alcohol abuse, tobacco abuse who presents to the emergency department due to intermittent hiccups and vomiting within the last few days.  He endorsed drinking 2 to 3- 16 ounce beers daily and smokes 3 to 4 cigarettes daily.  He denies fever, chills, shortness of breath, chest pain, headache, diarrhea or constipation  ED Course:  In the emergency department, he was hemodynamically stable.  Work-up in the ED showed normal CBC, BMP was normal except for hyponatremia and hypochloremia.  Alcohol level was 11, magnesium 2.1, urinalysis was normal, lipase 35. Chest x-ray showed no active disease. She was treated with IV hydration, potassium was was given, Pepcid was provided and GI cocktail was given, patient was then given IV Zofran.  Hospitalist was asked to admit patient for further evaluation and management.  Review of Systems: Review of systems as noted in the HPI. All other systems reviewed and are negative.   History reviewed. No pertinent past medical history. Past Surgical History:  Procedure Laterality Date   EXPLORATORY LAPAROTOMY      Social History:  reports that he has been smoking cigars. He has never used smokeless tobacco. He reports current alcohol use. He reports that he does not use drugs.   No Known Allergies  History reviewed. No pertinent family history.   Prior to Admission medications   Not on File    Physical Exam: BP 122/89   Pulse 90   Temp 98.6 F (37 C) (Oral)   Resp 15   Ht 5\' 6"  (1.676 m)   Wt 61.2 kg   SpO2 95%   BMI 21.79 kg/m   General: 61 y.o. year-old male well developed well nourished in no acute  distress.  Alert and oriented x3. HEENT: NCAT, EOMI, dry mucous membrane. Neck: Supple, trachea medial Cardiovascular: Regular rate and rhythm with no rubs or gallops.  No thyromegaly or JVD noted.  No lower extremity edema. 2/4 pulses in all 4 extremities. Respiratory: Clear to auscultation with no wheezes or rales. Good inspiratory effort. Abdomen: Soft, nontender nondistended with normal bowel sounds x4 quadrants. Muskuloskeletal: No cyanosis, clubbing or edema noted bilaterally Neuro: CN II-XII intact, strength 5/5 x 4, sensation, reflexes intact Skin: No ulcerative lesions noted or rashes Psychiatry: Judgement and insight appear normal. Mood is appropriate for condition and setting          Labs on Admission:  Basic Metabolic Panel: Recent Labs  Lab 05/14/22 1507 05/14/22 1931  NA 115* 117*  K 3.4* 3.9  CL 72* 75*  CO2 28 25  GLUCOSE 101* 84  BUN 7 7  CREATININE 0.59* 0.55*  CALCIUM 8.7* 8.2*  MG  --  2.1   Liver Function Tests: Recent Labs  Lab 05/14/22 1507  AST 135*  ALT 72*  ALKPHOS 115  BILITOT 1.6*  PROT 8.2*  ALBUMIN 3.4*   Recent Labs  Lab 05/14/22 1507  LIPASE 35   No results for input(s): "AMMONIA" in the last 168 hours. CBC: Recent Labs  Lab 05/14/22 1507  WBC 9.5  HGB 14.5  HCT 40.4  MCV 85.2  PLT 218   Cardiac Enzymes: No results for input(s): "CKTOTAL", "  CKMB", "CKMBINDEX", "TROPONINI" in the last 168 hours.  BNP (last 3 results) No results for input(s): "BNP" in the last 8760 hours.  ProBNP (last 3 results) No results for input(s): "PROBNP" in the last 8760 hours.  CBG: No results for input(s): "GLUCAP" in the last 168 hours.  Radiological Exams on Admission: DG Chest Portable 1 View  Result Date: 05/14/2022 CLINICAL DATA:  hyponatremia EXAM: PORTABLE CHEST 1 VIEW COMPARISON:  Chest x-ray 12/31/2019 FINDINGS: The heart and mediastinal contours are within normal limits. No focal consolidation. No pulmonary edema. No pleural  effusion. No pneumothorax. No acute osseous abnormality. IMPRESSION: No active disease. Electronically Signed   By: Tish Frederickson M.D.   On: 05/14/2022 18:50    EKG: I independently viewed the EKG done and my findings are as followed: Normal sinus rhythm at a rate of 80 bpm with prolonged QTc 570 ms  Assessment/Plan Present on Admission:  Hyponatremia  Intractable hiccups  Tobacco abuse  Vomiting  Principal Problem:   Hyponatremia Active Problems:   Tobacco abuse   Vomiting   Intractable hiccups   Dehydration   Alcohol abuse  Hyponatremia possibly secondary to beer potomania and dehydration Na 115 > 117 Continue IV hydration Continue to monitor sodium with serial BMPs Urine osmolality, serum osmolality and urine sodium will be checked  Intractable hiccups and vomiting Continue IV Zofran Continue IV Protonix  Prolonged QT interval QTc 517 ms Avoid QT prolonging drugs Magnesium level will be checked Repeat EKG in the morning  Alcohol abuse/Tobacco abuse Patient counseled on alcohol/tobacco abuse cessation Monitor for alcohol withdrawal symptoms and consider starting patient on CIWA protocol  DVT prophylaxis: Lovenox  Code Status: Full code  Consults: None  Family Communication: None at bedside  Severity of Illness: The appropriate patient status for this patient is INPATIENT. Inpatient status is judged to be reasonable and necessary in order to provide the required intensity of service to ensure the patient's safety. The patient's presenting symptoms, physical exam findings, and initial radiographic and laboratory data in the context of their chronic comorbidities is felt to place them at high risk for further clinical deterioration. Furthermore, it is not anticipated that the patient will be medically stable for discharge from the hospital within 2 midnights of admission.   * I certify that at the point of admission it is my clinical judgment that the patient will  require inpatient hospital care spanning beyond 2 midnights from the point of admission due to high intensity of service, high risk for further deterioration and high frequency of surveillance required.*  Author: Frankey Shown, DO 05/15/2022 1:30 AM  For on call review [email or web address].

## 2022-05-14 NOTE — ED Triage Notes (Signed)
Pt to er, pt states that he is here because he has had the hiccups and has been vomiting for the past few days.  Pt states that he has vomited maybe once today

## 2022-05-15 DIAGNOSIS — F1010 Alcohol abuse, uncomplicated: Secondary | ICD-10-CM

## 2022-05-15 DIAGNOSIS — E860 Dehydration: Secondary | ICD-10-CM

## 2022-05-15 DIAGNOSIS — E871 Hypo-osmolality and hyponatremia: Secondary | ICD-10-CM | POA: Diagnosis not present

## 2022-05-15 HISTORY — DX: Alcohol abuse, uncomplicated: F10.10

## 2022-05-15 LAB — MRSA NEXT GEN BY PCR, NASAL: MRSA by PCR Next Gen: NOT DETECTED

## 2022-05-15 LAB — BASIC METABOLIC PANEL
Anion gap: 9 (ref 5–15)
BUN: 6 mg/dL (ref 6–20)
CO2: 27 mmol/L (ref 22–32)
Calcium: 8.2 mg/dL — ABNORMAL LOW (ref 8.9–10.3)
Chloride: 88 mmol/L — ABNORMAL LOW (ref 98–111)
Creatinine, Ser: 0.65 mg/dL (ref 0.61–1.24)
GFR, Estimated: >60 mL/min (ref >60)
Glucose, Bld: 108 mg/dL — ABNORMAL HIGH (ref 70–99)
Potassium: 3.5 mmol/L (ref 3.5–5.1)
Sodium: 124 mmol/L — ABNORMAL LOW (ref 135–145)

## 2022-05-15 LAB — APTT: aPTT: 41 seconds — ABNORMAL HIGH (ref 24–36)

## 2022-05-15 LAB — COMPREHENSIVE METABOLIC PANEL
ALT: 60 U/L — ABNORMAL HIGH (ref 0–44)
AST: 104 U/L — ABNORMAL HIGH (ref 15–41)
Albumin: 2.9 g/dL — ABNORMAL LOW (ref 3.5–5.0)
Alkaline Phosphatase: 99 U/L (ref 38–126)
Anion gap: 14 (ref 5–15)
BUN: 7 mg/dL (ref 6–20)
CO2: 25 mmol/L (ref 22–32)
Calcium: 8.1 mg/dL — ABNORMAL LOW (ref 8.9–10.3)
Chloride: 83 mmol/L — ABNORMAL LOW (ref 98–111)
Creatinine, Ser: 0.68 mg/dL (ref 0.61–1.24)
GFR, Estimated: >60 mL/min (ref >60)
Glucose, Bld: 74 mg/dL (ref 70–99)
Potassium: 4.1 mmol/L (ref 3.5–5.1)
Sodium: 122 mmol/L — ABNORMAL LOW (ref 135–145)
Total Bilirubin: 1.4 mg/dL — ABNORMAL HIGH (ref 0.3–1.2)
Total Protein: 6.7 g/dL (ref 6.5–8.1)

## 2022-05-15 LAB — CBC
HCT: 38.5 % — ABNORMAL LOW (ref 39.0–52.0)
Hemoglobin: 13.6 g/dL (ref 13.0–17.0)
MCH: 30.8 pg (ref 26.0–34.0)
MCHC: 35.3 g/dL (ref 30.0–36.0)
MCV: 87.3 fL (ref 80.0–100.0)
Platelets: 200 10*3/uL (ref 150–400)
RBC: 4.41 MIL/uL (ref 4.22–5.81)
RDW: 11.6 % (ref 11.5–15.5)
WBC: 6.8 10*3/uL (ref 4.0–10.5)
nRBC: 0 % (ref 0.0–0.2)

## 2022-05-15 LAB — SODIUM, URINE, RANDOM: Sodium, Ur: 20 mmol/L

## 2022-05-15 LAB — OSMOLALITY, URINE: Osmolality, Ur: 191 mOsm/kg — ABNORMAL LOW (ref 300–900)

## 2022-05-15 LAB — CREATININE, SERUM
Creatinine, Ser: 0.61 mg/dL (ref 0.61–1.24)
GFR, Estimated: >60 mL/min (ref >60)

## 2022-05-15 LAB — OSMOLALITY: Osmolality: 252 mOsm/kg — ABNORMAL LOW (ref 275–295)

## 2022-05-15 LAB — HIV ANTIBODY (ROUTINE TESTING W REFLEX): HIV Screen 4th Generation wRfx: NONREACTIVE

## 2022-05-15 MED ORDER — LORAZEPAM 1 MG PO TABS
1.0000 mg | ORAL_TABLET | ORAL | Status: AC | PRN
  Administered 2022-05-15: 1 mg via ORAL
  Administered 2022-05-15: 2 mg via ORAL
  Administered 2022-05-15 (×2): 1 mg via ORAL
  Filled 2022-05-15: qty 1
  Filled 2022-05-15: qty 2
  Filled 2022-05-15 (×2): qty 1

## 2022-05-15 MED ORDER — SODIUM CHLORIDE 0.9 % IV SOLN: INTRAVENOUS | Status: DC

## 2022-05-15 MED ORDER — ADULT MULTIVITAMIN W/MINERALS CH
1.0000 | ORAL_TABLET | Freq: Every day | ORAL | Status: DC
  Administered 2022-05-15 – 2022-05-21 (×7): 1 via ORAL
  Filled 2022-05-15 (×6): qty 1

## 2022-05-15 MED ORDER — CHLORHEXIDINE GLUCONATE CLOTH 2 % EX PADS
6.0000 | MEDICATED_PAD | Freq: Every day | CUTANEOUS | Status: DC
  Administered 2022-05-15 – 2022-05-19 (×5): 6 via TOPICAL

## 2022-05-15 MED ORDER — PNEUMOCOCCAL 20-VAL CONJ VACC 0.5 ML IM SUSY: 0.5000 mL | PREFILLED_SYRINGE | INTRAMUSCULAR | Status: DC

## 2022-05-15 MED ORDER — ONDANSETRON HCL 4 MG/2ML IJ SOLN
4.0000 mg | Freq: Four times a day (QID) | INTRAMUSCULAR | Status: DC | PRN
  Administered 2022-05-15 (×2): 4 mg via INTRAVENOUS
  Filled 2022-05-15 (×2): qty 2

## 2022-05-15 MED ORDER — THIAMINE HCL 100 MG/ML IJ SOLN
100.0000 mg | Freq: Every day | INTRAMUSCULAR | Status: DC
  Filled 2022-05-15: qty 2

## 2022-05-15 MED ORDER — FOLIC ACID 1 MG PO TABS
1.0000 mg | ORAL_TABLET | Freq: Every day | ORAL | Status: DC
  Administered 2022-05-15 – 2022-05-21 (×7): 1 mg via ORAL
  Filled 2022-05-15 (×7): qty 1

## 2022-05-15 MED ORDER — GUAIFENESIN-DM 100-10 MG/5ML PO SYRP
5.0000 mL | ORAL_SOLUTION | ORAL | Status: DC | PRN
  Administered 2022-05-15 – 2022-05-20 (×9): 5 mL via ORAL
  Filled 2022-05-15 (×10): qty 5

## 2022-05-15 MED ORDER — PHENOL 1.4 % MT LIQD
1.0000 | OROMUCOSAL | Status: DC | PRN
  Administered 2022-05-15: 1 via OROMUCOSAL
  Filled 2022-05-15: qty 177

## 2022-05-15 MED ORDER — PANTOPRAZOLE SODIUM 40 MG IV SOLR
40.0000 mg | INTRAVENOUS | Status: DC
  Administered 2022-05-15 – 2022-05-18 (×4): 40 mg via INTRAVENOUS
  Filled 2022-05-15 (×4): qty 10

## 2022-05-15 MED ORDER — INFLUENZA VAC SPLIT QUAD 0.5 ML IM SUSY: 0.5000 mL | PREFILLED_SYRINGE | INTRAMUSCULAR | Status: DC

## 2022-05-15 MED ORDER — NICOTINE 14 MG/24HR TD PT24
14.0000 mg | MEDICATED_PATCH | Freq: Every day | TRANSDERMAL | Status: DC
  Administered 2022-05-19 – 2022-05-20 (×2): 14 mg via TRANSDERMAL
  Filled 2022-05-15 (×7): qty 1

## 2022-05-15 MED ORDER — LORAZEPAM 2 MG/ML IJ SOLN
1.0000 mg | INTRAMUSCULAR | Status: AC | PRN
  Administered 2022-05-15 – 2022-05-16 (×2): 2 mg via INTRAVENOUS
  Filled 2022-05-15 (×2): qty 1

## 2022-05-15 MED ORDER — THIAMINE MONONITRATE 100 MG PO TABS
100.0000 mg | ORAL_TABLET | Freq: Every day | ORAL | Status: DC
  Administered 2022-05-15 – 2022-05-21 (×7): 100 mg via ORAL
  Filled 2022-05-15 (×7): qty 1

## 2022-05-15 MED ORDER — ENOXAPARIN SODIUM 40 MG/0.4ML IJ SOSY
40.0000 mg | PREFILLED_SYRINGE | INTRAMUSCULAR | Status: DC
  Administered 2022-05-15 – 2022-05-21 (×6): 40 mg via SUBCUTANEOUS
  Filled 2022-05-15 (×7): qty 0.4

## 2022-05-15 MED ORDER — DIAZEPAM 2 MG PO TABS
2.0000 mg | ORAL_TABLET | Freq: Three times a day (TID) | ORAL | Status: DC
  Administered 2022-05-15 – 2022-05-16 (×4): 2 mg via ORAL
  Filled 2022-05-15 (×4): qty 1

## 2022-05-15 MED ORDER — ENSURE ENLIVE PO LIQD
237.0000 mL | Freq: Two times a day (BID) | ORAL | Status: DC
  Administered 2022-05-15 – 2022-05-19 (×9): 237 mL via ORAL

## 2022-05-15 NOTE — Progress Notes (Signed)
PROGRESS NOTE     Caleb Kim, is a 61 y.o. male, DOB - 12-04-1960, [id number]  Admit date - 05/14/2022   Admitting Physician Caleb Shown, DO  Outpatient Primary MD for the patient is Caleb Kim  LOS - 1  Chief Complaint  Patient presents with   Emesis        Brief Narrative:  99 61 y.o. male with medical history significant of alcohol abuse, tobacco abuse admitted with dehydration hyponatremia and hypochloremia in the setting of ongoing alcohol abuse    -Assessment and Plan:  1)HypoNatremia/hypochloremia----sodium is up to 124 from 115, chloride is up to 88 from 72 -Continue IV normal saline -Avoid excessive free water -Monitor electrolytes closely  2) alcoholic hepatitis-- AST :ALT ratio is almost 2-1 -LFTs improving  3) alcohol abuse--- patient's wife states that patient drinks 6 to 9 cans of beer Kim day each 1 about 40 ounces  -High risk for DTs -Continue benzos Kim CIWA protocol -Continue multivitamin  4) nausea vomiting and hiccups----improving with improvement in sodium -Continue as needed antiemetics  5) tobacco abuse--- cessation advised nicotine patch as ordered  6) social/ethics--plan of care discussed with patient, patient's wife and patient's mother at bedside -Patient declines referral to [facility] for possible alcohol rehab -Patient remains a full code  Disposition/Need for in-Hospital Stay- patient unable to be discharged at this time due to --alcohol abuse with profound electrolyte abnormalities including hyponatremia and hypochloremia requiring IV fluids and further monitoring  Status is: Inpatient   Disposition: The patient is from: Home              Anticipated d/c is to: Home              Anticipated d/c date is: 1 day              Patient currently is not medically stable to d/c. Barriers: Not Clinically Stable-   Code Status :  -  Code Status: Full Code   Family Communication:   Discussed with patient's mother and patient's  wife at bedside  DVT Prophylaxis  :   - SCDs   enoxaparin (LOVENOX) injection 40 mg Start: 05/15/22 1000 SCDs Start: 05/15/22 0200   Lab Results  Component Value Date   PLT 200 05/15/2022   Inpatient Medications  Scheduled Meds:  Chlorhexidine Gluconate Cloth  6 each Topical Q0600   diazepam  2 mg Oral TID   enoxaparin (LOVENOX) injection  40 mg Subcutaneous Q24H   feeding supplement  237 mL Oral BID BM   folic acid  1 mg Oral Daily   [START ON 05/16/2022] influenza vac split quadrivalent PF  0.5 mL Intramuscular Tomorrow-1000   multivitamin with minerals  1 tablet Oral Daily   pantoprazole (PROTONIX) IV  40 mg Intravenous Q24H   [START ON 05/16/2022] pneumococcal 20-valent conjugate vaccine  0.5 mL Intramuscular Tomorrow-1000   thiamine  100 mg Oral Daily   Or   thiamine  100 mg Intravenous Daily   Continuous Infusions:  sodium chloride 100 mL/hr at 05/15/22 1636   PRN Meds:.guaiFENesin-dextromethorphan, LORazepam **OR** LORazepam, ondansetron (ZOFRAN) IV, phenol   Anti-infectives (From admission, onward)    None        Subjective: Caleb Kim today has no fevers, no emesis,  No chest pain,   -Nausea improving -Oral intake is fair -Cooperative -His wife and mother at bedside   Objective: Vitals:   05/15/22 1300 05/15/22 1500 05/15/22 1600 05/15/22 1700  BP: 137/80 (!) 184/81 Marland Kitchen)  140/87 (!) 147/100  Pulse:      Resp: 18 (!) 22 20 15   Temp:      TempSrc:      SpO2:      Weight:      Height:        Intake/Output Summary (Last 24 hours) at 05/15/2022 1743 Last data filed at 05/15/2022 1636 Gross Kim 24 hour  Intake 963.03 ml  Output --  Net 963.03 ml   Filed Weights   05/14/22 1422 05/15/22 0204  Weight: 61.2 kg 52.1 kg   Physical Exam  Gen:- Awake Alert, in no apparent distress  HEENT:- [place].AT, No sclera icterus Neck-Supple Neck,No JVD,.  Lungs-  CTAB , fair symmetrical air movement CV- S1, S2 normal, regular  Abd-  +ve B.Sounds, Abd Soft, No  tenderness,    Extremity/Skin:- No  edema, pedal pulses present  Psych-affect is appropriate, oriented x3 Neuro-Generalized weakness, no new focal deficits, +ve tremors  Data Reviewed: I have personally reviewed following labs and imaging studies  CBC: Recent Labs  Lab 05/14/22 1507 05/15/22 0237  WBC 9.5 6.8  HGB 14.5 13.6  HCT 40.4 38.5*  MCV 85.2 87.3  PLT 218 195   Basic Metabolic Panel: Recent Labs  Lab 05/14/22 1507 05/14/22 1931 05/15/22 0237 05/15/22 1511  NA 115* 117* 122* 124*  K 3.4* 3.9 4.1 3.5  CL 72* 75* 83* 88*  CO2 28 25 25 27   GLUCOSE 101* 84 74 108*  BUN 7 7 7 6   CREATININE 0.59* 0.55* 0.68  0.61 0.65  CALCIUM 8.7* 8.2* 8.1* 8.2*  MG  --  2.1  --   --    GFR: Estimated Creatinine Clearance: 73.3 mL/min (by C-G formula based on SCr of 0.65 mg/dL). Liver Function Tests: Recent Labs  Lab 05/14/22 1507 05/15/22 0237  AST 135* 104*  ALT 72* 60*  ALKPHOS 115 99  BILITOT 1.6* 1.4*  PROT 8.2* 6.7  ALBUMIN 3.4* 2.9*   Recent Results (from the past 240 hour(s))  MRSA Next Gen by PCR, Nasal     Status: None   Collection Time: 05/15/22  1:59 AM   Specimen: Nasal Mucosa; Nasal Swab  Result Value Ref Range Status   MRSA by PCR Next Gen NOT DETECTED NOT DETECTED Final    Comment: (NOTE) The GeneXpert MRSA Assay (FDA approved for NASAL specimens only), is one component of a comprehensive MRSA colonization surveillance program. It is not intended to diagnose MRSA infection nor to guide or monitor treatment for MRSA infections. Test performance is not FDA approved in patients less than 27 years old. Performed at [facility], [address], [place], [place] [address]       Radiology Studies: DG Chest Portable 1 View  Result Date: 05/14/2022 CLINICAL DATA:  hyponatremia EXAM: PORTABLE CHEST 1 VIEW COMPARISON:  Chest x-ray 12/31/2019 FINDINGS: The heart and mediastinal contours are within normal limits. No focal consolidation. No pulmonary edema. No  pleural effusion. No pneumothorax. No acute osseous abnormality. IMPRESSION: No active disease. Electronically Signed   By: Iven Finn M.D.   On: 05/14/2022 18:50     Scheduled Meds:  Chlorhexidine Gluconate Cloth  6 each Topical Q0600   diazepam  2 mg Oral TID   enoxaparin (LOVENOX) injection  40 mg Subcutaneous Q24H   feeding supplement  237 mL Oral BID BM   folic acid  1 mg Oral Daily   [START ON 05/16/2022] influenza vac split quadrivalent PF  0.5 mL Intramuscular Tomorrow-1000  multivitamin with minerals  1 tablet Oral Daily   pantoprazole (PROTONIX) IV  40 mg Intravenous Q24H   [START ON 05/16/2022] pneumococcal 20-valent conjugate vaccine  0.5 mL Intramuscular Tomorrow-1000   thiamine  100 mg Oral Daily   Or   thiamine  100 mg Intravenous Daily   Continuous Infusions:  sodium chloride 100 mL/hr at 05/15/22 1636     LOS: 1 day    Shon Hale M.D on 05/15/2022 at 5:43 PM  Go to [email or web address] - for contact info  [facility] - Office  [phone]  If 7PM-7AM, please contact night-coverage [email or web address] 05/15/2022, 5:43 PM   /.

## 2022-05-15 NOTE — Progress Notes (Signed)
Patient from home with mother. Consult for housing, utilities, and sAS resources. Patient provided [employer]. Declined SA resources.

## 2022-05-15 NOTE — Plan of Care (Signed)

## 2022-05-15 NOTE — Progress Notes (Signed)
  Transition of Care [facility]) Screening Note   Patient Details  Name: Paddy Neis Date of Birth: 1961-06-19   Transition of Care [facility]) CM/SW Contact:    Ihor Gully, LCSW Phone Number: 05/15/2022, 12:23 PM    Transition of Care Department [facility]) has reviewed patient and no TOC needs have been identified at this time. We will continue to monitor patient advancement through interdisciplinary progression rounds. If new patient transition needs arise, please place a TOC consult.

## 2022-05-16 DIAGNOSIS — E871 Hypo-osmolality and hyponatremia: Secondary | ICD-10-CM | POA: Diagnosis not present

## 2022-05-16 LAB — RENAL FUNCTION PANEL
Albumin: 3 g/dL — ABNORMAL LOW (ref 3.5–5.0)
Anion gap: 9 (ref 5–15)
BUN: <5 mg/dL — ABNORMAL LOW (ref 6–20)
CO2: 26 mmol/L (ref 22–32)
Calcium: 8.3 mg/dL — ABNORMAL LOW (ref 8.9–10.3)
Chloride: 91 mmol/L — ABNORMAL LOW (ref 98–111)
Creatinine, Ser: 0.63 mg/dL (ref 0.61–1.24)
GFR, Estimated: >60 mL/min (ref >60)
Glucose, Bld: 92 mg/dL (ref 70–99)
Phosphorus: 2 mg/dL — ABNORMAL LOW (ref 2.5–4.6)
Potassium: 3.8 mmol/L (ref 3.5–5.1)
Sodium: 126 mmol/L — ABNORMAL LOW (ref 135–145)

## 2022-05-16 MED ORDER — VENLAFAXINE HCL 37.5 MG PO TABS
75.0000 mg | ORAL_TABLET | Freq: Two times a day (BID) | ORAL | Status: DC
  Administered 2022-05-16 – 2022-05-21 (×10): 75 mg via ORAL
  Filled 2022-05-16 (×10): qty 2

## 2022-05-16 MED ORDER — DIAZEPAM 2 MG PO TABS
2.0000 mg | ORAL_TABLET | Freq: Three times a day (TID) | ORAL | Status: AC
  Administered 2022-05-17: 2 mg via ORAL
  Filled 2022-05-16 (×2): qty 1

## 2022-05-16 NOTE — Progress Notes (Signed)
PROGRESS NOTE     Caleb Kim, is a 61 y.o. male, DOB - Jan 26, 1961, RAQ:[id number]  Admit date - 05/14/2022   Admitting Physician Bernadette Hoit, DO  Outpatient Primary MD for the patient is Patient, No Pcp Per  LOS - 2  Chief Complaint  Patient presents with   Emesis        Brief Narrative:  - 61 y.o. male with medical history significant of alcohol abuse, tobacco abuse admitted with dehydration hyponatremia and hypochloremia in the setting of ongoing alcohol abuse    -Assessment and Plan:  1)HypoNatremia/hypochloremia----sodium is up to 126 from 115, chloride is up to 91 from 72 -Continue IV normal saline -Avoid excessive free water -Monitor electrolytes closely -overall improving  2)Alcoholic Hepatitis-- AST :ALT ratio is almost 2-1 -LFTs improving Repeat in am   3)Alcohol Abuse--- patient's wife states that patient drinks 6 to 9 cans of beer per day each 1 about 40 ounces  -High risk for DTs -Continue benzos per CIWA protocol -Continue multivitamin  4)Nausea/Vomiting and Hiccups----improving with improvement in sodium -Continue as needed antiemetics  5)Tobacco Abuse--- cessation advised nicotine patch as ordered  6) social/ethics--plan of care discussed with patient, patient's wife and patient's mother at bedside -Patient declines referral to [facility] for possible alcohol rehab -Patient remains a full code  Disposition/Need for in-Hospital Stay- patient unable to be discharged at this time due to --alcohol abuse with profound electrolyte abnormalities including hyponatremia and hypochloremia requiring IV fluids and further monitoring ---Anticipate discharge home on 05/17/22 if continues to improve  Status is: Inpatient   Disposition: The patient is from: Home              Anticipated d/c is to: Home              Anticipated d/c date is: 1 day              Patient currently is not medically stable to d/c. Barriers: Not Clinically Stable-   Code Status :  -   Code Status: Full Code   Family Communication:   Discussed with patient's mother and patient's wife at bedside  DVT Prophylaxis  :   - SCDs   enoxaparin (LOVENOX) injection 40 mg Start: 05/15/22 1000 SCDs Start: 05/15/22 0200  Lab Results  Component Value Date   PLT 200 05/15/2022   Inpatient Medications  Scheduled Meds:  Chlorhexidine Gluconate Cloth  6 each Topical Q0600   diazepam  2 mg Oral TID   enoxaparin (LOVENOX) injection  40 mg Subcutaneous Q24H   feeding supplement  237 mL Oral BID BM   folic acid  1 mg Oral Daily   influenza vac split quadrivalent PF  0.5 mL Intramuscular Tomorrow-1000   multivitamin with minerals  1 tablet Oral Daily   nicotine  14 mg Transdermal Daily   pantoprazole (PROTONIX) IV  40 mg Intravenous Q24H   pneumococcal 20-valent conjugate vaccine  0.5 mL Intramuscular Tomorrow-1000   thiamine  100 mg Oral Daily   Or   thiamine  100 mg Intravenous Daily   Continuous Infusions:  sodium chloride 100 mL/hr at 05/16/22 0216   PRN Meds:.guaiFENesin-dextromethorphan, LORazepam **OR** LORazepam, ondansetron (ZOFRAN) IV, phenol   Anti-infectives (From admission, onward)    None       Subjective: Caleb Kim today has no fevers, no emesis,  No chest pain,   Sleepy, cooperative -oral intake is fair  Voiding ok  Objective: Vitals:   05/16/22 0808 05/16/22 0900 05/16/22 1100 05/16/22 1153  BP:  129/81 (!) 161/92   Pulse:      Resp:  18 16   Temp: 98.5 F (36.9 C)   97.8 F (36.6 C)  TempSrc: Oral   Axillary  SpO2:   99%   Weight:      Height:        Intake/Output Summary (Last 24 hours) at 05/16/2022 1447 Last data filed at 05/16/2022 [id number] Gross per 24 hour  Intake 313.28 ml  Output 1500 ml  Net -1186.72 ml   Filed Weights   05/14/22 1422 05/15/22 0204  Weight: 61.2 kg 52.1 kg   Physical Exam  Gen:- Awake Alert, in no apparent distress  HEENT:- [place].AT, No sclera icterus Neck-Supple Neck,No JVD,.  Lungs-  CTAB , fair  symmetrical air movement CV- S1, S2 normal, regular  Abd-  +ve B.Sounds, Abd Soft, No tenderness,    Extremity/Skin:- No  edema, pedal pulses present  Psych-affect is appropriate, oriented x3 Neuro-Generalized weakness, no new focal deficits, +ve tremors  Data Reviewed: I have personally reviewed following labs and imaging studies  CBC: Recent Labs  Lab 05/14/22 1507 05/15/22 0237  WBC 9.5 6.8  HGB 14.5 13.6  HCT 40.4 38.5*  MCV 85.2 87.3  PLT 218 [id number]   Basic Metabolic Panel: Recent Labs  Lab 05/14/22 1507 05/14/22 1931 05/15/22 0237 05/15/22 1511 05/16/22 0408  NA 115* 117* 122* 124* 126*  K 3.4* 3.9 4.1 3.5 3.8  CL 72* 75* 83* 88* 91*  CO2 28 25 25 27 26   GLUCOSE 101* 84 74 108* 92  BUN 7 7 7 6  <5*  CREATININE 0.59* 0.55* 0.68  0.61 0.65 0.63  CALCIUM 8.7* 8.2* 8.1* 8.2* 8.3*  MG  --  2.1  --   --   --   PHOS  --   --   --   --  2.0*   GFR: Estimated Creatinine Clearance: 73.3 mL/min (by C-G formula based on SCr of 0.63 mg/dL). Liver Function Tests: Recent Labs  Lab 05/14/22 1507 05/15/22 0237 05/16/22 0408  AST 135* 104*  --   ALT 72* 60*  --   ALKPHOS 115 99  --   BILITOT 1.6* 1.4*  --   PROT 8.2* 6.7  --   ALBUMIN 3.4* 2.9* 3.0*   Recent Results (from the past 240 hour(s))  MRSA Next Gen by PCR, Nasal     Status: None   Collection Time: 05/15/22  1:59 AM   Specimen: Nasal Mucosa; Nasal Swab  Result Value Ref Range Status   MRSA by PCR Next Gen NOT DETECTED NOT DETECTED Final    Comment: (NOTE) The GeneXpert MRSA Assay (FDA approved for NASAL specimens only), is one component of a comprehensive MRSA colonization surveillance program. It is not intended to diagnose MRSA infection nor to guide or monitor treatment for MRSA infections. Test performance is not FDA approved in patients less than 58 years old. Performed at [facility], [address], [place], [place] [address]     Radiology Studies: DG Chest Portable 1 View  Result Date:  05/14/2022 CLINICAL DATA:  hyponatremia EXAM: PORTABLE CHEST 1 VIEW COMPARISON:  Chest x-ray 12/31/2019 FINDINGS: The heart and mediastinal contours are within normal limits. No focal consolidation. No pulmonary edema. No pleural effusion. No pneumothorax. No acute osseous abnormality. IMPRESSION: No active disease. Electronically Signed   By: Iven Finn M.D.   On: 05/14/2022 18:50    Scheduled Meds:  Chlorhexidine Gluconate Cloth  6 each Topical Q0600  diazepam  2 mg Oral TID   enoxaparin (LOVENOX) injection  40 mg Subcutaneous Q24H   feeding supplement  237 mL Oral BID BM   folic acid  1 mg Oral Daily   influenza vac split quadrivalent PF  0.5 mL Intramuscular Tomorrow-1000   multivitamin with minerals  1 tablet Oral Daily   nicotine  14 mg Transdermal Daily   pantoprazole (PROTONIX) IV  40 mg Intravenous Q24H   pneumococcal 20-valent conjugate vaccine  0.5 mL Intramuscular Tomorrow-1000   thiamine  100 mg Oral Daily   Or   thiamine  100 mg Intravenous Daily   Continuous Infusions:  sodium chloride 100 mL/hr at 05/16/22 0216    LOS: 2 days   Roxan Hockey M.D on 05/16/2022 at 2:47 PM  Go to [email or web address] - for contact info  [facility] - Office  [phone]  If 7PM-7AM, please contact night-coverage [email or web address] 05/16/2022, 2:47 PM   /.

## 2022-05-17 DIAGNOSIS — E871 Hypo-osmolality and hyponatremia: Secondary | ICD-10-CM | POA: Diagnosis not present

## 2022-05-17 LAB — COMPREHENSIVE METABOLIC PANEL
ALT: 41 U/L (ref 0–44)
AST: 46 U/L — ABNORMAL HIGH (ref 15–41)
Albumin: 2.4 g/dL — ABNORMAL LOW (ref 3.5–5.0)
Alkaline Phosphatase: 81 U/L (ref 38–126)
Anion gap: 7 (ref 5–15)
BUN: 6 mg/dL (ref 6–20)
CO2: 22 mmol/L (ref 22–32)
Calcium: 7.9 mg/dL — ABNORMAL LOW (ref 8.9–10.3)
Chloride: 99 mmol/L (ref 98–111)
Creatinine, Ser: 0.54 mg/dL — ABNORMAL LOW (ref 0.61–1.24)
GFR, Estimated: >60 mL/min (ref >60)
Glucose, Bld: 94 mg/dL (ref 70–99)
Potassium: 3 mmol/L — ABNORMAL LOW (ref 3.5–5.1)
Sodium: 128 mmol/L — ABNORMAL LOW (ref 135–145)
Total Bilirubin: 1.8 mg/dL — ABNORMAL HIGH (ref 0.3–1.2)
Total Protein: 5.8 g/dL — ABNORMAL LOW (ref 6.5–8.1)

## 2022-05-17 LAB — OCCULT BLOOD X 1 CARD TO LAB, STOOL: Fecal Occult Bld: NEGATIVE

## 2022-05-17 LAB — GLUCOSE, CAPILLARY: Glucose-Capillary: 89 mg/dL (ref 70–99)

## 2022-05-17 MED ORDER — POTASSIUM CHLORIDE CRYS ER 20 MEQ PO TBCR
40.0000 meq | EXTENDED_RELEASE_TABLET | ORAL | Status: AC
  Administered 2022-05-17 (×2): 40 meq via ORAL
  Filled 2022-05-17 (×2): qty 2

## 2022-05-17 NOTE — Progress Notes (Signed)
PROGRESS NOTE     Caleb Kim, is a 61 y.o. male, DOB - Sep 29, 1960, [id number]  Admit date - 05/14/2022   Admitting Physician Bernadette Hoit, DO  Outpatient Primary MD for the patient is Patient, No Pcp Per  LOS - 3  Chief Complaint  Patient presents with   Emesis        Brief Narrative:  - 61 y.o. male with medical history significant of alcohol abuse, tobacco abuse admitted on 05/14/2022 with dehydration hyponatremia and hypochloremia in the setting of ongoing alcohol abuse    -Assessment and Plan:  1)HypoNatremia/hypochloremia----sodium is up to 128 from 115, chloride is up to 99 from 72 -Continue IV normal saline -Avoid excessive free water -Monitor electrolytes closely -Sodium continues to trend up slowly  2)Alcoholic Hepatitis-- AST :ALT ratio is almost 2-1 -LFTs improving    Latest Ref Rng & Units 05/17/2022    6:23 AM 05/16/2022    4:08 AM 05/15/2022    2:37 AM  Hepatic Function  Total Protein 6.5 - 8.1 g/dL 5.8   6.7   Albumin 3.5 - 5.0 g/dL 2.4  3.0  2.9   AST 15 - 41 U/L 46   104   ALT 0 - 44 U/L 41   60   Alk Phosphatase 38 - 126 U/L 81   99   Total Bilirubin 0.3 - 1.2 mg/dL 1.8   1.4      3)Alcohol Abuse--- patient's wife states that patient drinks 6 to 9 cans of beer per day each 1 about 40 ounces  -High risk for DTs -Continue benzos per CIWA protocol -Continue multivitamin -Delirium tremors improving but patient continues to have intermittent episodes of disorientation and confusion   4)Nausea/Vomiting and Hiccups----improving with improvement in sodium -Continue as needed antiemetics  5)Tobacco Abuse--- cessation advised nicotine patch as ordered  6) social/ethics--plan of care discussed with patient, patient's wife and patient's mother at bedside -Patient declines referral to [facility] for possible alcohol rehab -Patient remains a full code  7) generalized weakness/ambulatory dysfunction/ fall risk ----very weak, incontinent of feces in  bed -Get physical therapy consult to determine disposition  Disposition/Need for in-Hospital Stay- patient unable to be discharged at this time due to --alcohol abuse with profound electrolyte abnormalities including hyponatremia and hypochloremia requiring IV fluids and further monitoring -Awaiting physical therapy consult/eval  Status is: Inpatient   Disposition: The patient is from: Home              Anticipated d/c is to: Home              Anticipated d/c date is: 1 day              Patient currently is not medically stable to d/c. Barriers: Not Clinically Stable-   Code Status :  -  Code Status: Full Code   Family Communication:   Discussed with patient's mother and patient's wife at bedside  DVT Prophylaxis  :   - SCDs   enoxaparin (LOVENOX) injection 40 mg Start: 05/15/22 1000 SCDs Start: 05/15/22 0200  Lab Results  Component Value Date   PLT 200 05/15/2022   Inpatient Medications  Scheduled Meds:  Chlorhexidine Gluconate Cloth  6 each Topical Q0600   enoxaparin (LOVENOX) injection  40 mg Subcutaneous Q24H   feeding supplement  237 mL Oral BID BM   folic acid  1 mg Oral Daily   influenza vac split quadrivalent PF  0.5 mL Intramuscular Tomorrow-1000   multivitamin with minerals  1  tablet Oral Daily   nicotine  14 mg Transdermal Daily   pantoprazole (PROTONIX) IV  40 mg Intravenous Q24H   pneumococcal 20-valent conjugate vaccine  0.5 mL Intramuscular Tomorrow-1000   thiamine  100 mg Oral Daily   Or   thiamine  100 mg Intravenous Daily   venlafaxine  75 mg Oral BID WC   Continuous Infusions:  sodium chloride 100 mL/hr at 05/17/22 0121   PRN Meds:.guaiFENesin-dextromethorphan, LORazepam **OR** LORazepam, ondansetron (ZOFRAN) IV, phenol   Anti-infectives (From admission, onward)    None       Subjective: [name] today has no fevers, no emesis,  No chest pain,   Very weak, incontinent of feces in bed -Intermittent confusion  persist  Objective: Vitals:   05/17/22 0122 05/17/22 0206 05/17/22 0603 05/17/22 1423  BP: 115/73 113/77 125/87 124/76  Pulse: (!) 108 (!) 106 100 90  Resp: _0 Temp: 97.8 F (36.6 C) 97.9 F (36.6 C) 98.5 F (36.9 C) 98.6 F (37 C)  TempSrc:  Oral Oral Oral  SpO2: 95% 95% 96% 96%  Weight:   52.9 kg   Height:        Intake/Output Summary (Last 24 hours) at 05/17/2022 1819 Last data filed at 05/17/2022 1300 Gross per 24 hour  Intake 480 ml  Output 600 ml  Net -120 ml   Filed Weights   05/14/22 1422 05/15/22 0204 05/17/22 0603  Weight: 61.2 kg 52.1 kg 52.9 kg   Physical Exam  Gen:- Awake Alert, in no apparent distress  HEENT:- [place].AT, No sclera icterus Neck-Supple Neck,No JVD,.  Lungs-  CTAB , fair symmetrical air movement CV- S1, S2 normal, regular  Abd-  +ve B.Sounds, Abd Soft, No tenderness,    Extremity/Skin:- No  edema, pedal pulses present  Psych-intermittent confusion and disorientation persist Neuro-Generalized weakness, no new focal deficits, +ve tremors  Data Reviewed: I have personally reviewed following labs and imaging studies  CBC: Recent Labs  Lab 05/14/22 1507 05/15/22 0237  WBC 9.5 6.8  HGB 14.5 13.6  HCT 40.4 38.5*  MCV 85.2 87.3  PLT 218 537   Basic Metabolic Panel: Recent Labs  Lab 05/14/22 1931 05/15/22 0237 05/15/22 1511 05/16/22 0408 05/17/22 0623  NA 117* 122* 124* 126* 128*  K 3.9 4.1 3.5 3.8 3.0*  CL 75* 83* 88* 91* 99  CO2 _1 GLUCOSE 84 74 108* 92 94  BUN _2 <5* 6  CREATININE 0.55* 0.68  0.61 0.65 0.63 0.54*  CALCIUM 8.2* 8.1* 8.2* 8.3* 7.9*  MG 2.1  --   --   --   --   PHOS  --   --   --  2.0*  --    GFR: Estimated Creatinine Clearance: 74.4 mL/min (A) (by C-G formula based on SCr of 0.54 mg/dL (L)). Liver Function Tests: Recent Labs  Lab 05/14/22 1507 05/15/22 0237 05/16/22 0408 05/17/22 0623  AST 135* 104*  --  46*  ALT 72* 60*  --  41  ALKPHOS 115 99  --  81  BILITOT 1.6* 1.4*  --   1.8*  PROT 8.2* 6.7  --  5.8*  ALBUMIN 3.4* 2.9* 3.0* 2.4*   Recent Results (from the past 240 hour(s))  MRSA Next Gen by PCR, Nasal     Status: None   Collection Time: 05/15/22  1:59 AM   Specimen: Nasal Mucosa; Nasal Swab  Result Value Ref Range Status   MRSA by PCR  Next Gen NOT DETECTED NOT DETECTED Final    Comment: (NOTE) The GeneXpert MRSA Assay (FDA approved for NASAL specimens only), is one component of a comprehensive MRSA colonization surveillance program. It is not intended to diagnose MRSA infection nor to guide or monitor treatment for MRSA infections. Test performance is not FDA approved in patients less than 71 years old. Performed at [facility], [address] [place], [place] [address]     Radiology Studies: No results found.  Scheduled Meds:  Chlorhexidine Gluconate Cloth  6 each Topical Q0600   enoxaparin (LOVENOX) injection  40 mg Subcutaneous Q24H   feeding supplement  237 mL Oral BID BM   folic acid  1 mg Oral Daily   influenza vac split quadrivalent PF  0.5 mL Intramuscular Tomorrow-1000   multivitamin with minerals  1 tablet Oral Daily   nicotine  14 mg Transdermal Daily   pantoprazole (PROTONIX) IV  40 mg Intravenous Q24H   pneumococcal 20-valent conjugate vaccine  0.5 mL Intramuscular Tomorrow-1000   thiamine  100 mg Oral Daily   Or   thiamine  100 mg Intravenous Daily   venlafaxine  75 mg Oral BID WC   Continuous Infusions:  sodium chloride 100 mL/hr at 05/17/22 0121    LOS: 3 days   Roxan Hockey M.D on 05/17/2022 at 6:19 PM  Go to [email or web address] - for contact info  [facility] - Office  [phone]  If 7PM-7AM, please contact night-coverage [email or web address] 05/17/2022, 6:19 PM   /.

## 2022-05-18 DIAGNOSIS — E871 Hypo-osmolality and hyponatremia: Secondary | ICD-10-CM | POA: Diagnosis not present

## 2022-05-18 LAB — RENAL FUNCTION PANEL
Albumin: 2.4 g/dL — ABNORMAL LOW (ref 3.5–5.0)
Anion gap: 9 (ref 5–15)
BUN: 6 mg/dL (ref 6–20)
CO2: 21 mmol/L — ABNORMAL LOW (ref 22–32)
Calcium: 8.3 mg/dL — ABNORMAL LOW (ref 8.9–10.3)
Chloride: 97 mmol/L — ABNORMAL LOW (ref 98–111)
Creatinine, Ser: 0.46 mg/dL — ABNORMAL LOW (ref 0.61–1.24)
GFR, Estimated: >60 mL/min (ref >60)
Glucose, Bld: 80 mg/dL (ref 70–99)
Phosphorus: 2.2 mg/dL — ABNORMAL LOW (ref 2.5–4.6)
Potassium: 3.5 mmol/L (ref 3.5–5.1)
Sodium: 127 mmol/L — ABNORMAL LOW (ref 135–145)

## 2022-05-18 MED ORDER — PANTOPRAZOLE SODIUM 40 MG PO TBEC
40.0000 mg | DELAYED_RELEASE_TABLET | Freq: Every day | ORAL | Status: DC
  Administered 2022-05-19 – 2022-05-21 (×3): 40 mg via ORAL
  Filled 2022-05-18 (×3): qty 1

## 2022-05-18 MED ORDER — MIRTAZAPINE 15 MG PO TABS
15.0000 mg | ORAL_TABLET | Freq: Every day | ORAL | Status: DC
  Administered 2022-05-18 – 2022-05-19 (×2): 15 mg via ORAL
  Filled 2022-05-18 (×3): qty 1

## 2022-05-18 MED ORDER — POTASSIUM PHOSPHATES 15 MMOLE/5ML IV SOLN
30.0000 mmol | Freq: Once | INTRAVENOUS | Status: AC
  Administered 2022-05-18: 30 mmol via INTRAVENOUS
  Filled 2022-05-18: qty 10

## 2022-05-18 MED ORDER — SODIUM CHLORIDE 0.9 % IV SOLN: INTRAVENOUS | Status: DC

## 2022-05-18 NOTE — Plan of Care (Signed)
  Problem: Acute Rehab PT Goals(only PT should resolve) Goal: Pt Will Go Supine/Side To Sit Outcome: Progressing Flowsheets (Taken 05/18/2022 1224) Pt will go Supine/Side to Sit: with supervision Goal: Patient Will Transfer Sit To/From Stand Outcome: Progressing Flowsheets (Taken 05/18/2022 1224) Patient will transfer sit to/from stand:  with min guard assist  with minimal assist Goal: Pt Will Transfer Bed To Chair/Chair To Bed Outcome: Progressing Flowsheets (Taken 05/18/2022 1224) Pt will Transfer Bed to Chair/Chair to Bed:  min guard assist  with min assist Goal: Pt Will Ambulate Outcome: Progressing Flowsheets (Taken 05/18/2022 1224) Pt will Ambulate:  50 feet  with min guard assist  with minimal assist  with rolling walker   12:25 PM, 05/18/22 Lonell Grandchild, MPT Physical Therapist with [facility] [phone] office [phone] mobile phone

## 2022-05-18 NOTE — Evaluation (Signed)
Occupational Therapy Evaluation Patient Details Name: Caleb Kim MRN: [id number] DOB: [birth year] Today's Date: 05/18/2022   History of Present Illness 61 y.o. male admitted on 05/14/22 due to hiccups and vomiting. PMH significant for alcohol abuse and tobacco abuse.   Clinical Impression   Pt admitted for concerns listed above. PTA pt reported that he was independent with all ADL's and IADL's, including driving. At this time, pt presents with increased weakness, balance deficits, cognitive deficits, and activity tolerance. He is requiring up to mod A for functional mobility and ADL's, as well as verbal cuing for safety. Recommending SNF if family can not provide 24/7 assist. OT will continue to follow acutely.       Recommendations for follow up therapy are one component of a multi-disciplinary discharge planning process, led by the attending physician.  Recommendations may be updated based on patient status, additional functional criteria and insurance authorization.   Follow Up Recommendations  Skilled nursing-short term rehab (<3 hours/day)    Assistance Recommended at Discharge Frequent or constant Supervision/Assistance  Patient can return home with the following A lot of help with walking and/or transfers;A lot of help with bathing/dressing/bathroom;Assistance with cooking/housework;Direct supervision/assist for medications management;Direct supervision/assist for financial management;Help with stairs or ramp for entrance    Functional Status Assessment  Patient has had a recent decline in their functional status and demonstrates the ability to make significant improvements in function in a reasonable and predictable amount of time.  Equipment Recommendations  Other (comment) (TBD)    Recommendations for Other Services       Precautions / Restrictions Precautions Precautions: Fall Restrictions Weight Bearing Restrictions: No      Mobility Bed Mobility Overal bed  mobility: Needs Assistance Bed Mobility: Supine to Sit     Supine to sit: Min assist     General bed mobility comments: Increased time, assist to sit all the way up    Transfers Overall transfer level: Needs assistance Equipment used: Rolling walker (2 wheels) Transfers: Sit to/from Stand Sit to Stand: Mod assist           General transfer comment: Difficulty with powering up to standing, requring mod A with all mobility      Balance Overall balance assessment: Needs assistance Sitting-balance support: Feet supported, No upper extremity supported Sitting balance-Leahy Scale: Fair     Standing balance support: During functional activity, No upper extremity supported Standing balance-Leahy Scale: Poor                             ADL either performed or assessed with clinical judgement   ADL Overall ADL's : Needs assistance/impaired Eating/Feeding: Set up;Sitting   Grooming: Min guard;Standing   Upper Body Bathing: Min guard;Sitting   Lower Body Bathing: Moderate assistance;Sitting/lateral leans;Sit to/from stand   Upper Body Dressing : Min guard;Sitting   Lower Body Dressing: Moderate assistance;Sit to/from stand;Sitting/lateral leans   Toilet Transfer: Moderate assistance;Rolling walker (2 wheels)   Toileting- Clothing Manipulation and Hygiene: Moderate assistance;Sit to/from stand;Sitting/lateral lean       Functional mobility during ADLs: Moderate assistance;Rolling walker (2 wheels) General ADL Comments: Pt with slowed processing and increased need for assist     Vision Baseline Vision/History: 1 Wears glasses Ability to See in Adequate Light: 0 Adequate Patient Visual Report: No change from baseline Vision Assessment?: No apparent visual deficits     Perception     Praxis      Pertinent Vitals/Pain Pain  Assessment Pain Assessment: No/denies pain     Hand Dominance Right   Extremity/Trunk Assessment Upper Extremity  Assessment Upper Extremity Assessment: Generalized weakness (tremors)   Lower Extremity Assessment Lower Extremity Assessment: Defer to PT evaluation   Cervical / Trunk Assessment Cervical / Trunk Assessment: Kyphotic   Communication Communication Communication: No difficulties   Cognition Arousal/Alertness: Awake/alert Behavior During Therapy: WFL for tasks assessed/performed Overall Cognitive Status: No family/caregiver present to determine baseline cognitive functioning                                 General Comments: Slowed responses     General Comments  VSS on RA    Exercises     Shoulder Instructions      Home Living Family/patient expects to be discharged to:: Private residence Living Arrangements: Parent Available Help at Discharge: Family;Available PRN/intermittently Type of Home: Mobile home Home Access: Level entry     Home Layout: One level     Bathroom Shower/Tub: Chief Strategy Officer: Handicapped height     Home Equipment: Cane - single point   Additional Comments: Pt reports that he lives with his wife who can provide 24/7 assist, however RN reports that he lives with his mom, unsure what assist she can give.      Prior Functioning/Environment Prior Level of Function : Independent/Modified Independent;Driving             Mobility Comments: Community ambulator without AD, drives ADLs Comments: Independent        OT Problem List: Decreased strength;Decreased activity tolerance;Impaired balance (sitting and/or standing);Decreased cognition;Decreased safety awareness;Impaired UE functional use      OT Treatment/Interventions: Self-care/ADL training;Therapeutic exercise;Energy conservation;DME and/or AE instruction;Therapeutic activities;Cognitive remediation/compensation;Patient/family education;Balance training    OT Goals(Current goals can be found in the care plan section) Acute Rehab OT Goals Patient  Stated Goal: To go home OT Goal Formulation: With patient Time For Goal Achievement: 06/01/22 Potential to Achieve Goals: Good ADL Goals Pt Will Perform Grooming: with modified independence;standing Pt Will Perform Lower Body Bathing: with modified independence;sitting/lateral leans;sit to/from stand Pt Will Perform Lower Body Dressing: with modified independence;sitting/lateral leans;sit to/from stand Pt Will Transfer to Toilet: with modified independence;ambulating Pt Will Perform Toileting - Clothing Manipulation and hygiene: with modified independence;sitting/lateral leans;sit to/from stand  OT Frequency: Min 1X/week    Co-evaluation              AM-PAC OT "6 Clicks" Daily Activity     Outcome Measure Help from another person eating meals?: A Little Help from another person taking care of personal grooming?: A Little Help from another person toileting, which includes using toliet, bedpan, or urinal?: A Lot Help from another person bathing (including washing, rinsing, drying)?: A Lot Help from another person to put on and taking off regular upper body clothing?: A Little Help from another person to put on and taking off regular lower body clothing?: A Lot 6 Click Score: 15   End of Session Equipment Utilized During Treatment: Gait belt;Rolling walker (2 wheels) Nurse Communication: Mobility status  Activity Tolerance: Patient tolerated treatment well Patient left: in chair;with call bell/phone within reach;with chair alarm set  OT Visit Diagnosis: Unsteadiness on feet (R26.81);Other abnormalities of gait and mobility (R26.89);Muscle weakness (generalized) (M62.81)                Time: 6269-4854 OT Time Calculation (min): 27 min Charges:  OT General  Charges $OT Visit: 1 Visit OT Evaluation $OT Eval Moderate Complexity: 1 Mod OT Treatments $Self Care/Home Management : 8-22 mins  Trish Mage, OTR/L [facility] Rehab Department  Ian Castagna Rosemarie Beath 05/18/2022, 4:36 PM

## 2022-05-18 NOTE — TOC Transition Note (Signed)
Transition of Care [facility] [place]) - CM/SW Discharge Note   Patient Details  Name: Caleb Kim MRN: [id number] Date of Birth: 28-Dec-1960  [facility]) CM/SW Contact:  Iona Beard, [place] Phone Number: 05/18/2022, 11:32 AM   Clinical Narrative:    CSW updated that PT is recommending SNF for pt at D/C. CSW attempted to reach pt via phone, he is only oriented x2. Due to this CSW spoke with pts first contact (his mother). Pts mother is agreeable to SNF and feels that pt will benefit from this. She is agreeable to referral being sent to local and [place] facilities. Fl2 and PASRR have been completed. CSW sent referral out. TOC to follow.     Barriers to Discharge: Continued Medical Work up   Patient Goals and CMS Choice Patient states their goals for this hospitalization and ongoing recovery are:: go to SNF CMS Medicare.gov Compare Post Acute Care list provided to:: Patient Represenative (must comment) Choice offered to / list presented to : Parent  Discharge Placement                       Discharge Plan and Services In-house Referral: Clinical Social Work Discharge Planning Services: CM Consult Post Acute Care Choice: [place]                               Social Determinants of Health (SDOH) Interventions Housing Interventions: Inpatient TOC   Readmission Risk Interventions     No data to display

## 2022-05-18 NOTE — Progress Notes (Signed)
PROGRESS NOTE     Caleb Kim, is a 61 y.o. male, DOB - [birth year], SHF:[id number]  Admit date - 05/14/2022   Admitting Physician Caleb Hoit, DO  Outpatient Primary MD for the patient is Patient, No Pcp Per  LOS - 4  Chief Complaint  Patient presents with   Emesis        Brief Narrative:  - 61 y.o. 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 -LFTs improving    Latest Ref Rng & Units 05/18/2022    5:53 AM 05/17/2022    6:23 AM 05/16/2022    4:08 AM  Hepatic Function  Total Protein 6.5 - 8.1 g/dL  5.8    Albumin 3.5 - 5.0 g/dL 2.4  2.4  3.0   AST 15 - 41 U/L  46    ALT 0 - 44 U/L  41    Alk Phosphatase 38 - 126 U/L  81    Total Bilirubin 0.3 - 1.2 mg/dL  1.8      3)Alcohol Abuse--- patient's wife states that patient drinks 6 to 9 cans of beer per day each 1 about 40 ounces  -Treated with benzos per CIWA protocol -Continue multivitamin -DT symptoms have improved significantly  4)Nausea/Vomiting and Hiccups----improving with improvement in sodium -Continue as needed antiemetics  5)Tobacco Abuse--- cessation advised nicotine patch as ordered  6) social/ethics--plan of care discussed with patient, patient's wife and patient's mother at bedside -Patient declines referral to [facility] for possible alcohol rehab -Patient remains a full code  7)Generalized weakness/ambulatory dysfunction/ fall risk ----very weak, incontinent of feces in bed -Significant difficulties with mobility related ADLs -Physical therapy eval appreciated recommends SNF rehab  Disposition/Need for  in-Hospital Stay- patient unable to be discharged at this time due to -awaiting transfer to SNF rehab pending bed availability and insurance approval  Status is: Inpatient   Disposition: The patient is from: Home              Anticipated d/c is to: SNF              Anticipated d/c date is: 1 day              Patient currently is not medically stable to d/c. Barriers: Not Clinically Stable-   Code Status :  -  Code Status: Full Code   Family Communication:   Discussed with patient's mother and patient's wife    DVT Prophylaxis  :   - SCDs   enoxaparin (LOVENOX) injection 40 mg Start: 05/15/22 1000 SCDs Start: 05/15/22 0200  Lab Results  Component Value Date   PLT 200 05/15/2022   Inpatient Medications  Scheduled Meds:  Chlorhexidine Gluconate Cloth  6 each Topical Q0600   enoxaparin (LOVENOX) injection  40 mg Subcutaneous Q24H   feeding supplement  237 mL Oral BID BM   folic acid  1 mg Oral Daily   influenza vac split quadrivalent PF  0.5 mL Intramuscular Tomorrow-1000   multivitamin with minerals  1 tablet Oral Daily   nicotine  14 mg Transdermal Daily   [START ON 05/19/2022] pantoprazole  40 mg Oral Daily   pneumococcal 20-valent conjugate vaccine  0.5 mL Intramuscular Tomorrow-1000   thiamine  100 mg Oral Daily   Or   thiamine  100 mg Intravenous Daily   venlafaxine  75 mg Oral BID WC   Continuous Infusions:  sodium chloride 50 mL/hr at 05/17/22 1834   potassium PHOSPHATE IVPB (in mmol)     PRN Meds:.guaiFENesin-dextromethorphan, ondansetron (ZOFRAN) IV, phenol   Anti-infectives (From admission, onward)    None       Subjective: Caleb Kim today has no fevers, no emesis,  No chest pain,   -Weakness persist -Oral intake is fair -More cooperative  Objective: Vitals:   05/17/22 2113 05/18/22 0500 05/18/22 0535 05/18/22 0927  BP: (!) 145/100  (!) 150/97 (!) 148/100  Pulse: 96  89 (!) 104  Resp: _0 Temp: 98.4 F (36.9 C)  98.2 F (36.8 C) 98.7  F (37.1 C)  TempSrc: Oral  Oral   SpO2: 100%  95% 96%  Weight:  54.8 kg    Height:        Intake/Output Summary (Last 24 hours) at 05/18/2022 1337 Last data filed at 05/17/2022 2149 Gross per 24 hour  Intake 120 ml  Output 1200 ml  Net -1080 ml   Filed Weights   05/15/22 0204 05/17/22 0603 05/18/22 0500  Weight: 52.1 kg 52.9 kg 54.8 kg   Physical Exam  Gen:- Awake Alert, in no apparent distress  HEENT:- [place].AT, No sclera icterus Neck-Supple Neck,No JVD,.  Lungs-  CTAB , fair symmetrical air movement CV- S1, S2 normal, regular  Abd-  +ve B.Sounds, Abd Soft, No tenderness,    Extremity/Skin:- No  edema, pedal pulses present  Psych-affect is appropriate, less confused, much more oriented Neuro-Generalized weakness, no new focal deficits, +ve tremors  Data Reviewed: I have personally reviewed following labs and imaging studies  CBC: Recent Labs  Lab 05/14/22 1507 05/15/22 0237  WBC 9.5 6.8  HGB 14.5 13.6  HCT 40.4 38.5*  MCV 85.2 87.3  PLT 218 381   Basic Metabolic Panel: Recent Labs  Lab 05/14/22 1931 05/15/22 0237 05/15/22 1511 05/16/22 0408 05/17/22 0623 05/18/22 0553  NA 117* 122* 124* 126* 128* 127*  K 3.9 4.1 3.5 3.8 3.0* 3.5  CL 75* 83* 88* 91* 99 97*  CO2 _1 21*  GLUCOSE 84 74 108* 92 94 80  BUN _2 <5* 6 6  CREATININE 0.55* 0.68  0.61 0.65 0.63 0.54* 0.46*  CALCIUM 8.2* 8.1* 8.2* 8.3* 7.9* 8.3*  MG 2.1  --   --   --   --   --   PHOS  --   --   --  2.0*  --  2.2*   GFR: Estimated Creatinine Clearance: 77.1 mL/min (A) (by C-G formula based on SCr of 0.46 mg/dL (L)). Liver Function Tests: Recent Labs  Lab 05/14/22 1507 05/15/22 0237 05/16/22 0408 05/17/22 0623 05/18/22 0553  AST 135* 104*  --  46*  --   ALT 72* 60*  --  41  --   ALKPHOS 115 99  --  81  --   BILITOT 1.6* 1.4*  --  1.8*  --   PROT 8.2* 6.7  --  5.8*  --   ALBUMIN 3.4* 2.9* 3.0* 2.4* 2.4*   Recent Results (from the past 240 hour(s))  MRSA Next Gen by PCR,  Nasal     Status: None   Collection Time: 05/15/22  1:59 AM   Specimen: Nasal Mucosa; Nasal Swab  Result Value Ref Range Status   MRSA  by PCR Next Gen NOT DETECTED NOT DETECTED Final    Comment: (NOTE) The GeneXpert MRSA Assay (FDA approved for NASAL specimens only), is one component of a comprehensive MRSA colonization surveillance program. It is not intended to diagnose MRSA infection nor to guide or monitor treatment for MRSA infections. Test performance is not FDA approved in patients less than 74 years old. Performed at [facility], [address], [place], [place] [address]     Radiology Studies: No results found.  Scheduled Meds:  Chlorhexidine Gluconate Cloth  6 each Topical Q0600   enoxaparin (LOVENOX) injection  40 mg Subcutaneous Q24H   feeding supplement  237 mL Oral BID BM   folic acid  1 mg Oral Daily   influenza vac split quadrivalent PF  0.5 mL Intramuscular Tomorrow-1000   multivitamin with minerals  1 tablet Oral Daily   nicotine  14 mg Transdermal Daily   [START ON 05/19/2022] pantoprazole  40 mg Oral Daily   pneumococcal 20-valent conjugate vaccine  0.5 mL Intramuscular Tomorrow-1000   thiamine  100 mg Oral Daily   Or   thiamine  100 mg Intravenous Daily   venlafaxine  75 mg Oral BID WC   Continuous Infusions:  sodium chloride 50 mL/hr at 05/17/22 1834   potassium PHOSPHATE IVPB (in mmol)      LOS: 4 days   Roxan Hockey M.D on 05/18/2022 at 1:37 PM  Go to [email or web address] - for contact info  [facility] - Office  [phone]  If 7PM-7AM, please contact night-coverage [email or web address] 05/18/2022, 1:37 PM   /.

## 2022-05-18 NOTE — NC FL2 (Addendum)
[place] LEVEL OF CARE SCREENING TOOL     IDENTIFICATION  Patient Name: Caleb Kim Birthdate: November 19, 1960 Sex: male Admission Date (Current Location): 05/14/2022  [facility] and [place] Number:  [employer] and Address:  [place] [address] [place]      Provider Number: [id number]  Attending Physician Name and Address:  Roxan Hockey, MD  Relative Name and Phone Number:  Bosie Clos (Mother)   [phone]    Current Level of Care: Hospital Recommended Level of Care: [facility] Prior Approval Number:    Date Approved/Denied:   PASRR Number:  [id number]  Discharge Plan: SNF    Current Diagnoses: Patient Active Problem List   Diagnosis Date Noted   Dehydration 05/15/2022   Alcohol abuse 05/15/2022   Alcohol withdrawal ([place]) 01/02/2020   Hyponatremia 12/31/2019   Hypokalemia [id number]   Alcoholic intoxication without complication ([place]) [id number]   Tobacco abuse 12/31/2019   Vomiting 12/31/2019   Intractable hiccups 12/31/2019   Anorexia 12/31/2019   Transaminasemia     Orientation RESPIRATION BLADDER Height & Weight     Self, Place  Normal Incontinent, External catheter Weight: 120 lb 13 oz (54.8 kg) Height:  5\' 6"  (167.6 cm)  BEHAVIORAL SYMPTOMS/MOOD NEUROLOGICAL BOWEL NUTRITION STATUS      Continent Diet (Regular)  AMBULATORY STATUS COMMUNICATION OF NEEDS Skin   Extensive Assist Verbally Normal                       Personal Care Assistance Level of Assistance  Bathing, Feeding, Dressing Bathing Assistance: Limited assistance Feeding assistance: Independent Dressing Assistance: Limited assistance     Functional Limitations Info  Sight, Hearing, Speech Sight Info: Adequate Hearing Info: Adequate Speech Info: Adequate    SPECIAL CARE FACTORS FREQUENCY  PT (By licensed PT), OT (By licensed OT)     PT Frequency: 5 times weekly OT Frequency: 5 times weekly             Contractures Contractures Info: Not present    Additional Factors Info  Code Status, Allergies Code Status Info: FULL Allergies Info: NKA           Current Medications (05/18/2022):  This is the current hospital active medication list Current Facility-Administered Medications  Medication Dose Route Frequency Provider Last Rate Last Admin   0.9 %  sodium chloride infusion   Intravenous Continuous Denton Brick, Courage, MD 50 mL/hr at 05/17/22 1834 Rate Change at 05/17/22 1834   Chlorhexidine Gluconate Cloth 2 % PADS 6 each  6 each Topical Q0600 Adefeso, Oladapo, DO   6 each at 05/18/22 0503   enoxaparin (LOVENOX) injection 40 mg  40 mg Subcutaneous Q24H Adefeso, Oladapo, DO   40 mg at 05/18/22 0859   feeding supplement (ENSURE ENLIVE / ENSURE PLUS) liquid 237 mL  237 mL Oral BID BM Emokpae, Courage, MD   237 mL at [id number]   folic acid (FOLVITE) tablet 1 mg  1 mg Oral Daily Adefeso, Oladapo, DO   1 mg at 05/18/22 0858   guaiFENesin-dextromethorphan (ROBITUSSIN DM) 100-10 MG/5ML syrup 5 mL  5 mL Oral Q4H PRN Adefeso, Oladapo, DO   5 mL at 05/18/22 0913   influenza vac split quadrivalent PF (FLUARIX) injection 0.5 mL  0.5 mL Intramuscular Tomorrow-1000 Adefeso, Oladapo, DO       multivitamin with minerals tablet 1 tablet  1 tablet Oral Daily Adefeso, Oladapo, DO   1 tablet at 05/18/22 [phone]  nicotine (NICODERM CQ - dosed in mg/24 hours) patch 14 mg  14 mg Transdermal Daily Emokpae, Courage, MD       ondansetron (ZOFRAN) injection 4 mg  4 mg Intravenous Q6H PRN Adefeso, Oladapo, DO   4 mg at 05/15/22 1508   [START ON 05/19/2022] pantoprazole (PROTONIX) EC tablet 40 mg  40 mg Oral Daily Emokpae, Courage, MD       phenol (CHLORASEPTIC) mouth spray 1 spray  1 spray Mouth/Throat PRN Denton Brick, Courage, MD   1 spray at 05/15/22 2241   pneumococcal 20-valent conjugate vaccine (PREVNAR 20) injection 0.5 mL  0.5 mL Intramuscular Tomorrow-1000 Adefeso, Oladapo, DO       thiamine (VITAMIN B1) tablet  100 mg  100 mg Oral Daily Adefeso, Oladapo, DO   100 mg at 05/18/22 5750   Or   thiamine (VITAMIN B1) injection 100 mg  100 mg Intravenous Daily Adefeso, Oladapo, DO       venlafaxine (EFFEXOR) tablet 75 mg  75 mg Oral BID WC Emokpae, Courage, MD   75 mg at 05/18/22 5183     Discharge Medications: Please see discharge summary for a list of discharge medications.  Relevant Imaging Results:  Relevant Lab Results:   Additional Information SSN: [id number] [place]

## 2022-05-18 NOTE — Evaluation (Signed)
Physical Therapy Evaluation Patient Details Name: Caleb Kim MRN: [id number] DOB: [birth year] Today's Date: 05/18/2022  History of Present Illness  Caleb Kim is a 61 y.o. male with medical history significant of alcohol abuse, tobacco abuse who presents to the emergency department due to intermittent hiccups and vomiting within the last few days.  He endorsed drinking 2 to 3- 16 ounce beers daily and smokes 3 to 4 cigarettes daily.  He denies fever, chills, shortness of breath, chest pain, headache, diarrhea or constipation   Clinical Impression  Patient demonstrates slow labored movement for sitting up at bedside, repeated attempts for completing sit to stands without AD, required use of RW, very unsteady on feet with frequent buckling of knees and limited to ambulating to bathroom due to generalized weakness and fatigue.  Patient tolerated sitting up in chair after therapy - RN notified.  Patient will benefit from continued skilled physical therapy in hospital and recommended venue below to increase strength, balance, endurance for safe ADLs and gait.          Recommendations for follow up therapy are one component of a multi-disciplinary discharge planning process, led by the attending physician.  Recommendations may be updated based on patient status, additional functional criteria and insurance authorization.  Follow Up Recommendations Skilled nursing-short term rehab (<3 hours/day) Can patient physically be transported by private vehicle: Yes    Assistance Recommended at Discharge Set up Supervision/Assistance  Patient can return home with the following  A lot of help with walking and/or transfers;A little help with bathing/dressing/bathroom;Assistance with cooking/housework;Help with stairs or ramp for entrance    Equipment Recommendations Rolling walker (2 wheels)  Recommendations for Other Services       Functional Status Assessment Patient has had a recent decline in their  functional status and demonstrates the ability to make significant improvements in function in a reasonable and predictable amount of time.     Precautions / Restrictions Precautions Precautions: Fall Restrictions Weight Bearing Restrictions: No      Mobility  Bed Mobility Overal bed mobility: Needs Assistance Bed Mobility: Supine to Sit     Supine to sit: Min guard, Min assist     General bed mobility comments: increased time, labored movement    Transfers Overall transfer level: Needs assistance Equipment used: Rolling walker (2 wheels) Transfers: Sit to/from Stand, Bed to chair/wheelchair/BSC Sit to Stand: Min assist, Mod assist   Step pivot transfers: Min assist, Mod assist       General transfer comment: difficulty with sit to stands due to BLE weakness, required use of RW    Ambulation/Gait Ambulation/Gait assistance: Min assist, Mod assist Gait Distance (Feet): 15 Feet Assistive device: Rolling walker (2 wheels) Gait Pattern/deviations: Decreased step length - right, Decreased step length - left, Decreased stride length, Trunk flexed Gait velocity: decreased     General Gait Details: slow labored movement with difficulty advancing BLE due to weakness, frequent buckling of knees  Stairs            Wheelchair Mobility    Modified Rankin (Stroke Patients Only)       Balance Overall balance assessment: Needs assistance Sitting-balance support: Feet supported, No upper extremity supported Sitting balance-Leahy Scale: Fair Sitting balance - Comments: fair/good seated at EOB   Standing balance support: During functional activity, No upper extremity supported Standing balance-Leahy Scale: Poor Standing balance comment: fair/poor using RW  Pertinent Vitals/Pain Pain Assessment Pain Assessment: No/denies pain    Home Living Family/patient expects to be discharged to:: Private residence Living Arrangements:  Parent Available Help at Discharge: Family;Available PRN/intermittently Type of Home: Mobile home Home Access: Level entry       Home Layout: One level Home Equipment: Cane - single point      Prior Function Prior Level of Function : Independent/Modified Independent;Driving             Mobility Comments: Community ambulator without AD, drives ADLs Comments: Independent     Hand Dominance   Dominant Hand: Right    Extremity/Trunk Assessment   Upper Extremity Assessment Upper Extremity Assessment: Defer to OT evaluation    Lower Extremity Assessment Lower Extremity Assessment: Generalized weakness    Cervical / Trunk Assessment Cervical / Trunk Assessment: Kyphotic  Communication   Communication: No difficulties  Cognition Arousal/Alertness: Awake/alert Behavior During Therapy: WFL for tasks assessed/performed Overall Cognitive Status: Within Functional Limits for tasks assessed                                          General Comments      Exercises     Assessment/Plan    PT Assessment Patient needs continued PT services  PT Problem List Decreased strength;Decreased activity tolerance;Decreased balance;Decreased mobility       PT Treatment Interventions DME instruction;Gait training;Stair training;Functional mobility training;Therapeutic activities;Therapeutic exercise;Balance training;Patient/family education    PT Goals (Current goals can be found in the Care Plan section)  Acute Rehab PT Goals Patient Stated Goal: return home with family to assist PT Goal Formulation: With patient Time For Goal Achievement: 06/01/22 Potential to Achieve Goals: Good    Frequency Min 3X/week     Co-evaluation PT/OT/SLP Co-Evaluation/Treatment: Yes Reason for Co-Treatment: To address functional/ADL transfers PT goals addressed during session: Mobility/safety with mobility;Balance;Proper use of DME         AM-PAC PT "6 Clicks" Mobility   Outcome Measure Help needed turning from your back to your side while in a flat bed without using bedrails?: None Help needed moving from lying on your back to sitting on the side of a flat bed without using bedrails?: A Little Help needed moving to and from a bed to a chair (including a wheelchair)?: A Lot Help needed standing up from a chair using your arms (e.g., wheelchair or bedside chair)?: A Lot Help needed to walk in hospital room?: A Lot Help needed climbing 3-5 steps with a railing? : A Lot 6 Click Score: 15    End of Session   Activity Tolerance: Patient tolerated treatment well;Patient limited by fatigue Patient left: in chair;with call bell/phone within reach Nurse Communication: Mobility status PT Visit Diagnosis: Unsteadiness on feet (R26.81);Other abnormalities of gait and mobility (R26.89);Muscle weakness (generalized) (M62.81)    Time: 2706-2376 PT Time Calculation (min) (ACUTE ONLY): 23 min   Charges:   PT Evaluation $PT Eval Moderate Complexity: 1 Mod PT Treatments $Therapeutic Activity: 23-37 mins       12:23 PM, 05/18/22 Ocie Bob, MPT Physical Therapist with [facility] [phone] office [phone] mobile phone

## 2022-05-19 DIAGNOSIS — E871 Hypo-osmolality and hyponatremia: Secondary | ICD-10-CM | POA: Diagnosis not present

## 2022-05-19 LAB — RENAL FUNCTION PANEL
Albumin: 2.5 g/dL — ABNORMAL LOW (ref 3.5–5.0)
Anion gap: 11 (ref 5–15)
BUN: 6 mg/dL (ref 6–20)
CO2: 25 mmol/L (ref 22–32)
Calcium: 8.4 mg/dL — ABNORMAL LOW (ref 8.9–10.3)
Chloride: 91 mmol/L — ABNORMAL LOW (ref 98–111)
Creatinine, Ser: 0.62 mg/dL (ref 0.61–1.24)
GFR, Estimated: >60 mL/min (ref >60)
Glucose, Bld: 103 mg/dL — ABNORMAL HIGH (ref 70–99)
Phosphorus: 3.2 mg/dL (ref 2.5–4.6)
Potassium: 3.6 mmol/L (ref 3.5–5.1)
Sodium: 127 mmol/L — ABNORMAL LOW (ref 135–145)

## 2022-05-19 LAB — CBC
HCT: 22.3 % — ABNORMAL LOW (ref 39.0–52.0)
Hemoglobin: 8 g/dL — ABNORMAL LOW (ref 13.0–17.0)
MCH: 32.7 pg (ref 26.0–34.0)
MCHC: 35.9 g/dL (ref 30.0–36.0)
MCV: 91 fL (ref 80.0–100.0)
Platelets: 333 10*3/uL (ref 150–400)
RBC: 2.45 MIL/uL — ABNORMAL LOW (ref 4.22–5.81)
RDW: 12.2 % (ref 11.5–15.5)
WBC: 22.1 10*3/uL — ABNORMAL HIGH (ref 4.0–10.5)
nRBC: 0 % (ref 0.0–0.2)

## 2022-05-19 LAB — BASIC METABOLIC PANEL
Anion gap: 12 (ref 5–15)
BUN: 8 mg/dL (ref 6–20)
CO2: 21 mmol/L — ABNORMAL LOW (ref 22–32)
Calcium: 7.9 mg/dL — ABNORMAL LOW (ref 8.9–10.3)
Chloride: 91 mmol/L — ABNORMAL LOW (ref 98–111)
Creatinine, Ser: 0.63 mg/dL (ref 0.61–1.24)
GFR, Estimated: >60 mL/min (ref >60)
Glucose, Bld: 121 mg/dL — ABNORMAL HIGH (ref 70–99)
Potassium: 3.4 mmol/L — ABNORMAL LOW (ref 3.5–5.1)
Sodium: 124 mmol/L — ABNORMAL LOW (ref 135–145)

## 2022-05-19 MED ORDER — IPRATROPIUM-ALBUTEROL 0.5-2.5 (3) MG/3ML IN SOLN
3.0000 mL | Freq: Three times a day (TID) | RESPIRATORY_TRACT | Status: DC
  Administered 2022-05-19 – 2022-05-21 (×5): 3 mL via RESPIRATORY_TRACT
  Filled 2022-05-19 (×4): qty 3

## 2022-05-19 MED ORDER — AMOXICILLIN-POT CLAVULANATE 875-125 MG PO TABS
1.0000 | ORAL_TABLET | Freq: Two times a day (BID) | ORAL | Status: DC
  Administered 2022-05-19 – 2022-05-21 (×4): 1 via ORAL
  Filled 2022-05-19 (×4): qty 1

## 2022-05-19 MED ORDER — POTASSIUM CHLORIDE 10 MEQ/100ML IV SOLN
10.0000 meq | INTRAVENOUS | Status: AC
  Administered 2022-05-19 – 2022-05-20 (×4): 10 meq via INTRAVENOUS
  Filled 2022-05-19 (×4): qty 100

## 2022-05-19 MED ORDER — DM-GUAIFENESIN ER 30-600 MG PO TB12
1.0000 | ORAL_TABLET | Freq: Two times a day (BID) | ORAL | Status: DC
  Administered 2022-05-19 – 2022-05-21 (×4): 1 via ORAL
  Filled 2022-05-19 (×4): qty 1

## 2022-05-19 MED ORDER — ALBUTEROL SULFATE (2.5 MG/3ML) 0.083% IN NEBU: 2.5000 mg | INHALATION_SOLUTION | RESPIRATORY_TRACT | Status: DC | PRN

## 2022-05-19 MED ORDER — ENSURE ENLIVE PO LIQD
237.0000 mL | Freq: Three times a day (TID) | ORAL | Status: DC
  Administered 2022-05-20 – 2022-05-21 (×3): 237 mL via ORAL

## 2022-05-19 MED ORDER — ACETAMINOPHEN 325 MG PO TABS
650.0000 mg | ORAL_TABLET | Freq: Four times a day (QID) | ORAL | Status: DC | PRN
  Administered 2022-05-19: 650 mg via ORAL
  Filled 2022-05-19: qty 2

## 2022-05-19 MED ORDER — FUROSEMIDE 20 MG PO TABS
20.0000 mg | ORAL_TABLET | Freq: Once | ORAL | Status: AC
  Administered 2022-05-19: 20 mg via ORAL
  Filled 2022-05-19: qty 1

## 2022-05-19 NOTE — Progress Notes (Signed)
Chaplain engaged in an initial visit with [name].  Chaplain asked [name] about completing the Advanced Directive, Healthcare POA document and he voiced that he was not up to do that at the moment.  Chaplain offered understanding.  Chaplain then checked in with him and offered support.  [name] stated he was feeling better.  Chaplain assessed that [name] may have been experiencing some confusion or lack of coherence.  Chaplain let him know of the spiritual care support available to him.    05/19/22 0900  Clinical Encounter Type  Visited With Patient  Visit Type Initial;Social support

## 2022-05-19 NOTE — Progress Notes (Signed)
Pt awake, sitting up in bed drinking Ensure. Wife at bedside. Wife states that she has spoken with MD Courage and has had her questions answered satisfactorily.  Pt and wife updated on current treatment plan and interventions, wife states understanding.  Pt is much more alert than earlier in shift, denies c/o, now able to hold cup and drink without assistance. Took meds without difficulty. Continues with congested, non-productive cough, breath sounds continue with rhonchi upper chest. Pt denies c/o SOB, SaO2 100% on room air, current heart rate 103/min SR per tele.

## 2022-05-19 NOTE — Progress Notes (Signed)
PROGRESS NOTE     Caleb Kim, is a 61 y.o. male, DOB - 12/22/1960, WUX:[id number]  Admit date - 05/14/2022   Admitting Physician Frankey Shown, DO  Outpatient Primary MD for the Caleb Kim is Caleb Kim, No Pcp Per  LOS - 5  Chief Complaint  Caleb Kim presents with   Emesis        Brief Narrative:  - 61 y.o. 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 -LFTs improving  3)Alcohol Abuse--- Caleb Kim's wife states that Caleb Kim drinks 6 to 9 cans of beer per day each 1 about 40 ounces  -Treated with benzos per CIWA protocol -Continue multivitamin -DT symptoms have mostly resolved  4)Nausea/Vomiting and Hiccups----most resolved, -Continue as needed antiemetics  5)Tobacco Abuse--- cessation advised nicotine patch as ordered  6) social/ethics--plan of care discussed with Caleb Kim, Caleb Kim's wife and Caleb Kim's mother at bedside -Caleb Kim declines referral to [facility] for possible alcohol rehab -Caleb Kim remains a full code  7)Generalized weakness/ambulatory dysfunction/ fall risk ----very weak, incontinent of feces in bed -Significant difficulties with mobility related ADLs -Physical therapy eval appreciated recommends SNF rehab  8)Fevers/Cough/leukocytosis --- Caleb Kim is a smoker, given recent altered mentation in setting of delirium tremens cannot rule out aspiration episodes -Given fevers and leukocytosis treat empirically with Augmentin for possible aspiration pneumonia -Chest x-ray on 05/20/2022 -Incentive spirometry, mucolytics and bronchodilators as ordered- -blood cultures  requested  Disposition/Need for in-Hospital Stay- Caleb Kim unable to be discharged at this time due to -awaiting transfer to SNF rehab pending bed availability and insurance approval  Status is: Inpatient   Disposition: The Caleb Kim is from: Home              Anticipated d/c is to: SNF              Anticipated d/c date is: 1 day              Caleb Kim currently is not medically stable to d/c. Barriers: Not Clinically Stable-   Code Status :  -  Code Status: Full Code   Family Communication:   Discussed with Caleb Kim's mother and Caleb Kim's wife    DVT Prophylaxis  :   - SCDs   enoxaparin (LOVENOX) injection 40 mg Start: 05/15/22 1000 SCDs Start: 05/15/22 0200  Lab Results  Component Value Date   PLT 333 05/19/2022   Inpatient Medications  Scheduled Meds:  amoxicillin-clavulanate  1 tablet Oral Q12H   Chlorhexidine Gluconate Cloth  6 each Topical Q0600   dextromethorphan-guaiFENesin  1 tablet Oral BID   enoxaparin (LOVENOX) injection  40 mg Subcutaneous Q24H   feeding supplement  237 mL Oral TID PC   folic acid  1 mg Oral Daily   influenza vac split quadrivalent PF  0.5 mL Intramuscular Tomorrow-1000   ipratropium-albuterol  3 mL Nebulization TID   mirtazapine  15 mg Oral QHS   multivitamin with minerals  1 tablet Oral Daily   nicotine  14 mg Transdermal Daily   pantoprazole  40 mg Oral Daily   pneumococcal 20-valent conjugate vaccine  0.5 mL Intramuscular Tomorrow-1000   thiamine  100 mg Oral Daily   Or   thiamine  100 mg Intravenous Daily   venlafaxine  75 mg Oral BID WC  Continuous Infusions:  potassium chloride 10 mEq (05/19/22 2020)   PRN Meds:.acetaminophen, albuterol, guaiFENesin-dextromethorphan, ondansetron (ZOFRAN) IV, phenol   Anti-infectives (From admission, onward)    Start     Dose/Rate Route Frequency Ordered Stop   05/19/22 1800  amoxicillin-clavulanate (AUGMENTIN) 875-125 MG per tablet 1 tablet        1 tablet Oral Every 12 hours 05/19/22 1632          Subjective: [name] today has no emesis,  No chest pain,     -Oral intake is not great -No further diarrhea 05/19/22---Fevers/Cough/leukocytosis  Caleb Kim's wife is at bedside, questions answered  Objective: Vitals:   05/19/22 1700 05/19/22 1740 05/19/22 1830 05/19/22 2056  BP: 97/71 99/75  106/78  Pulse: (!) 103 (!) 102 (!) 103 (!) 116  Resp: 20 (!) 24 20 17   Temp: 99.5 F (37.5 C) 99.9 F (37.7 C)  (!) 97.3 F (36.3 C)  TempSrc: Oral Oral    SpO2: 93% 96% 100% 94%  Weight:      Height:        Intake/Output Summary (Last 24 hours) at 05/19/2022 2101 Last data filed at 05/19/2022 1700 Gross per 24 hour  Intake 450 ml  Output 400 ml  Net 50 ml   Filed Weights   05/15/22 0204 05/17/22 0603 05/18/22 0500  Weight: 52.1 kg 52.9 kg 54.8 kg   Physical Exam  Gen:- Awake Alert, in no apparent distress  HEENT:- [place].AT, No sclera icterus Neck-Supple Neck,No JVD,.  Lungs-diminished breath sounds with few scattered rhonchi  CV- S1, S2 normal, regular  Abd-  +ve B.Sounds, Abd Soft, No tenderness,    Extremity/Skin:- No  edema, pedal pulses present  Psych-affect is appropriate, less confused, much more oriented Neuro-Generalized weakness, no new focal deficits, +ve tremors  Data Reviewed: I have personally reviewed following labs and imaging studies  CBC: Recent Labs  Lab 05/14/22 1507 05/15/22 0237 05/19/22 1618  WBC 9.5 6.8 22.1*  HGB 14.5 13.6 8.0*  HCT 40.4 38.5* 22.3*  MCV 85.2 87.3 91.0  PLT 218 200 989   Basic Metabolic Panel: Recent Labs  Lab 05/14/22 1931 05/15/22 0237 05/16/22 0408 05/17/22 0623 05/18/22 0553 05/19/22 1005 05/19/22 1618  NA 117*   < > 126* 128* 127* 127* 124*  K 3.9   < > 3.8 3.0* 3.5 3.6 3.4*  CL 75*   < > 91* 99 97* 91* 91*  CO2 25   < > 26 22 21* 25 21*  GLUCOSE 84   < > 92 94 80 103* 121*  BUN 7   < > <5* 6 6 6 8   CREATININE 0.55*   < > 0.63 0.54* 0.46* 0.62 0.63  CALCIUM 8.2*   < > 8.3* 7.9* 8.3* 8.4* 7.9*  MG 2.1   --   --   --   --   --   --   PHOS  --   --  2.0*  --  2.2* 3.2  --    < > = values in this interval not displayed.   GFR: Estimated Creatinine Clearance: 77.1 mL/min (by C-G formula based on SCr of 0.63 mg/dL). Liver Function Tests: Recent Labs  Lab 05/14/22 1507 05/15/22 0237 05/16/22 0408 05/17/22 0623 05/18/22 0553 05/19/22 1005  AST 135* 104*  --  46*  --   --   ALT 72* 60*  --  41  --   --   ALKPHOS 115 99  --  81  --   --  BILITOT 1.6* 1.4*  --  1.8*  --   --   PROT 8.2* 6.7  --  5.8*  --   --   ALBUMIN 3.4* 2.9* 3.0* 2.4* 2.4* 2.5*   Recent Results (from the past 240 hour(s))  MRSA Next Gen by PCR, Nasal     Status: None   Collection Time: 05/15/22  1:59 AM   Specimen: Nasal Mucosa; Nasal Swab  Result Value Ref Range Status   MRSA by PCR Next Gen NOT DETECTED NOT DETECTED Final    Comment: (NOTE) The GeneXpert MRSA Assay (FDA approved for NASAL specimens only), is one component of a comprehensive MRSA colonization surveillance program. It is not intended to diagnose MRSA infection nor to guide or monitor treatment for MRSA infections. Test performance is not FDA approved in patients less than 52 years old. Performed at [facility], [address], [place], [place] [address]   Culture, blood (Routine X 2) w Reflex to ID Panel     Status: None (Preliminary result)   Collection Time: 05/19/22  4:18 PM   Specimen: BLOOD RIGHT ARM  Result Value Ref Range Status   Specimen Description BLOOD RIGHT ARM  Final   Special Requests   Final    BOTTLES DRAWN AEROBIC AND ANAEROBIC Blood Culture adequate volume Performed at [facility], [address] [place], [place] [address]    Culture PENDING  Incomplete   Report Status PENDING  Incomplete  Culture, blood (Routine X 2) w Reflex to ID Panel     Status: None (Preliminary result)   Collection Time: 05/19/22  4:18 PM   Specimen: BLOOD LEFT ARM  Result Value Ref Range Status   Specimen Description BLOOD LEFT ARM  Final    Special Requests   Final    BOTTLES DRAWN AEROBIC AND ANAEROBIC Blood Culture adequate volume Performed at [facility], [address], [place], [place] [address]    Culture PENDING  Incomplete   Report Status PENDING  Incomplete    Radiology Studies: No results found.  Scheduled Meds:  amoxicillin-clavulanate  1 tablet Oral Q12H   Chlorhexidine Gluconate Cloth  6 each Topical Q0600   dextromethorphan-guaiFENesin  1 tablet Oral BID   enoxaparin (LOVENOX) injection  40 mg Subcutaneous Q24H   feeding supplement  237 mL Oral TID PC   folic acid  1 mg Oral Daily   influenza vac split quadrivalent PF  0.5 mL Intramuscular Tomorrow-1000   ipratropium-albuterol  3 mL Nebulization TID   mirtazapine  15 mg Oral QHS   multivitamin with minerals  1 tablet Oral Daily   nicotine  14 mg Transdermal Daily   pantoprazole  40 mg Oral Daily   pneumococcal 20-valent conjugate vaccine  0.5 mL Intramuscular Tomorrow-1000   thiamine  100 mg Oral Daily   Or   thiamine  100 mg Intravenous Daily   venlafaxine  75 mg Oral BID WC   Continuous Infusions:  potassium chloride 10 mEq (05/19/22 2020)    LOS: 5 days   Shon Hale M.D on 05/19/2022 at 9:01 PM  Go to [email or web address] - for contact info  [facility] - Office  [phone]  If 7PM-7AM, please contact night-coverage [email or web address] 05/19/2022, 9:01 PM   /.

## 2022-05-19 NOTE — Progress Notes (Signed)
   05/19/22 1255  Vitals  Temp 99.5 F (37.5 C)  Temp Source Oral  BP 110/77  MAP (mmHg) 86  Pulse Rate (!) 124  Resp (!) 24  Level of Consciousness  Level of Consciousness Alert  MEWS COLOR  MEWS Score Color Yellow  Oxygen Therapy  SpO2 95 %  O2 Device Room Air  Pain Assessment  Pain Scale 0-10  Pain Score 0  Glasgow Coma Scale  Eye Opening 4  Best Verbal Response (NON-intubated) 5  Best Motor Response 6  Glasgow Coma Scale Score 15  MEWS Score  MEWS Temp 0  MEWS Systolic 0  MEWS Pulse 2  MEWS RR 1  MEWS LOC 0  MEWS Score 3  Provider Notification  Provider Name/Title Emokpae  Date Provider Notified 05/19/22  Time Provider Notified 1301  Method of Notification Page  Notification Reason Other (Comment) (yellow mews)   Pt awake but drowsy, denies c/o. Continues with congested but non-productive cough. Pt's HR up into 130's with coughing and exertion, RR 24/min, non-labored. Pt assisted back to bed using FWW and assist x2. Once pt settled in bed, heart rate down to 120/min but RR remains 21-24/min non-labored. Pt denies SOB, states cough is much better than this morning. Skin warm and dry, cap refill < 3 sec. MD Courage notified.

## 2022-05-19 NOTE — Progress Notes (Signed)
   05/19/22 1518  Vitals  Temp (!) 101.3 F (38.5 C)  Temp Source Oral  BP 111/79  MAP (mmHg) 90  BP Location Left Arm  BP Method Automatic  Patient Position (if appropriate) Lying  Pulse Rate (!) 113  Pulse Rate Source Dinamap  Resp 20  Level of Consciousness  Level of Consciousness Alert  MEWS COLOR  MEWS Score Color Yellow  Oxygen Therapy  SpO2 95 %  O2 Device Room Air  Pain Assessment  Pain Scale 0-10  Pain Score 0  MEWS Score  MEWS Temp 1  MEWS Systolic 0  MEWS Pulse 2  MEWS RR 0  MEWS LOC 0  MEWS Score 3   Pt has been sleeping since back to bed. Easily awakened, denies c/o. Current temp 101.3 oral, HR 110-115/min ST. Resp even and nonlabored, continues with occasional congested cough. MD Courage notified of above vitals and order for tylenol requested.

## 2022-05-20 ENCOUNTER — Inpatient Hospital Stay (HOSPITAL_COMMUNITY): Payer: Medicaid Other

## 2022-05-20 DIAGNOSIS — E860 Dehydration: Secondary | ICD-10-CM | POA: Diagnosis not present

## 2022-05-20 DIAGNOSIS — R066 Hiccough: Secondary | ICD-10-CM | POA: Diagnosis not present

## 2022-05-20 DIAGNOSIS — E871 Hypo-osmolality and hyponatremia: Secondary | ICD-10-CM | POA: Diagnosis not present

## 2022-05-20 DIAGNOSIS — Z720 Tobacco use: Secondary | ICD-10-CM | POA: Diagnosis not present

## 2022-05-20 LAB — CBC WITH DIFFERENTIAL/PLATELET
Abs Immature Granulocytes: 0.17 10*3/uL — ABNORMAL HIGH (ref 0.00–0.07)
Basophils Absolute: 0 10*3/uL (ref 0.0–0.1)
Basophils Relative: 0 %
Eosinophils Absolute: 0 10*3/uL (ref 0.0–0.5)
Eosinophils Relative: 0 %
HCT: 30.5 % — ABNORMAL LOW (ref 39.0–52.0)
Hemoglobin: 11 g/dL — ABNORMAL LOW (ref 13.0–17.0)
Immature Granulocytes: 1 %
Lymphocytes Relative: 6 %
Lymphs Abs: 0.9 10*3/uL (ref 0.7–4.0)
MCH: 31.9 pg (ref 26.0–34.0)
MCHC: 36.1 g/dL — ABNORMAL HIGH (ref 30.0–36.0)
MCV: 88.4 fL (ref 80.0–100.0)
Monocytes Absolute: 1.2 10*3/uL — ABNORMAL HIGH (ref 0.1–1.0)
Monocytes Relative: 8 %
Neutro Abs: 13.1 10*3/uL — ABNORMAL HIGH (ref 1.7–7.7)
Neutrophils Relative %: 85 %
Platelets: 250 10*3/uL (ref 150–400)
RBC: 3.45 MIL/uL — ABNORMAL LOW (ref 4.22–5.81)
RDW: 12.2 % (ref 11.5–15.5)
WBC: 15.5 10*3/uL — ABNORMAL HIGH (ref 4.0–10.5)
nRBC: 0 % (ref 0.0–0.2)

## 2022-05-20 LAB — COMPREHENSIVE METABOLIC PANEL
ALT: 38 U/L (ref 0–44)
AST: 44 U/L — ABNORMAL HIGH (ref 15–41)
Albumin: 2.4 g/dL — ABNORMAL LOW (ref 3.5–5.0)
Alkaline Phosphatase: 100 U/L (ref 38–126)
Anion gap: 10 (ref 5–15)
BUN: 9 mg/dL (ref 6–20)
CO2: 22 mmol/L (ref 22–32)
Calcium: 8.1 mg/dL — ABNORMAL LOW (ref 8.9–10.3)
Chloride: 94 mmol/L — ABNORMAL LOW (ref 98–111)
Creatinine, Ser: 0.51 mg/dL — ABNORMAL LOW (ref 0.61–1.24)
GFR, Estimated: >60 mL/min (ref >60)
Glucose, Bld: 94 mg/dL (ref 70–99)
Potassium: 3.6 mmol/L (ref 3.5–5.1)
Sodium: 126 mmol/L — ABNORMAL LOW (ref 135–145)
Total Bilirubin: 1.7 mg/dL — ABNORMAL HIGH (ref 0.3–1.2)
Total Protein: 6.7 g/dL (ref 6.5–8.1)

## 2022-05-20 LAB — MAGNESIUM: Magnesium: 1.6 mg/dL — ABNORMAL LOW (ref 1.7–2.4)

## 2022-05-20 LAB — AMMONIA: Ammonia: 38 umol/L — ABNORMAL HIGH (ref 9–35)

## 2022-05-20 MED ORDER — MAGNESIUM SULFATE 4 GM/100ML IV SOLN
4.0000 g | Freq: Once | INTRAVENOUS | Status: AC
  Administered 2022-05-20: 4 g via INTRAVENOUS
  Filled 2022-05-20: qty 100

## 2022-05-20 NOTE — Progress Notes (Signed)
Patient has been stable and alert this shift.  Patient has required no oxygen this shift, but still has congested breath sound and productive cough.  Patient sat in chair for approximately 2 hours this shift before going to bed. No new complaints from patient.

## 2022-05-20 NOTE — Progress Notes (Signed)
Pt currently up in chair feeding self breakfast. Pt able to sit self up in bed, stand with FWW and take few small step from bed to chair with only standby assistance. Baker Janus remains unsteady and must be reminded to stand tall while ambulating. Pt able to wash own face and brush his teeth this am without assistance. Wife at bedside, informed of plan of care for the day, states understanding. Pt continues with congested cough, occasionally productive with clear phlegm expectorated. Pt able to use both IS and flutter valve satisfactorily with encouragement.

## 2022-05-20 NOTE — Progress Notes (Signed)
PT Cancellation Note  Patient Details Name: Caleb Kim MRN: [id number] DOB: [birth year]   Cancelled Treatment:    Reason Eval/Treat Not Completed: Patient at procedure or test/unavailable Made a couple of attempts to see patient this morning. Having a prolonged bowel movement. RN in room, states he is moving a bit better today. Will follow-up as time allows and continue progressing patient as tolerated.  Candie Mile, PT, DPT Physical Therapist Acute Rehabilitation Services [facility]  05/20/2022, 11:02 AM

## 2022-05-20 NOTE — Progress Notes (Signed)
Pt requested to ambulate to bathroom for BM. Pt able to get self up out of chair using FWW and ambulate to BR with only standby assist. Pt had medium sized brown BM. Pt ambulated back to chair without difficulty.

## 2022-05-20 NOTE — Plan of Care (Signed)
  Problem: Clinical Measurements: Goal: Ability to maintain clinical measurements within normal limits will improve Outcome: Progressing Goal: Respiratory complications will improve Outcome: Progressing   Problem: Activity: Goal: Risk for activity intolerance will decrease Outcome: Progressing   Problem: Nutrition: Goal: Adequate nutrition will be maintained Outcome: Progressing   Problem: Elimination: Goal: Will not experience complications related to bowel motility Outcome: Progressing Goal: Will not experience complications related to urinary retention Outcome: Progressing   Problem: Safety: Goal: Ability to remain free from injury will improve Outcome: Progressing

## 2022-05-20 NOTE — Progress Notes (Signed)
PROGRESS NOTE   Caleb Kim, is a 61 y.o. male, DOB - 1961/05/03, [id number]  Admit date - 05/14/2022   Admitting Physician Bernadette Hoit, DO  Outpatient Primary MD for the Caleb Kim is Caleb Kim, No Pcp Per  LOS - 6  Chief Complaint  Caleb Kim presents with   Emesis      Brief Narrative:  - 61 y.o. 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 -LFTs improving  3)Alcohol Abuse--- Caleb Kim's wife states that Caleb Kim drinks 6 to 9 cans of beer per day each 1 about 40 ounces  -Treated with benzos per [facility]WA protocol -Continue multivitamin -DT symptoms have resolved  4)Nausea/Vomiting and Hiccups----resolved -Continue as needed antiemetics  5)Tobacco Abuse--- cessation advised nicotine patch as ordered  6) social/ethics--plan of care discussed with Caleb Kim, Caleb Kim's wife and Caleb Kim's mother at bedside -Caleb Kim declines referral to [facility] for possible alcohol rehab -Caleb Kim remains a full code  7)Generalized weakness/ambulatory dysfunction/ fall risk ----very weak, incontinent of feces in bed -Significant difficulties with mobility related ADLs -Physical therapy eval appreciated recommends SNF rehab  8)Fevers/Cough/leukocytosis --- Caleb Kim is a smoker, given recent altered mentation in setting of delirium tremens cannot rule out aspiration episodes -Given fevers and leukocytosis treat empirically with Augmentin for possible aspiration pneumonia -Chest x-ray on 05/20/2022 -Incentive spirometry, mucolytics and bronchodilators as ordered- -blood cultures requested  Disposition/Need for  in-Hospital Stay- Caleb Kim unable to be discharged at this time due to -awaiting transfer to SNF rehab pending bed availability and insurance approval  Status is: Inpatient   Disposition: The Caleb Kim is from: Home              Anticipated d/c is to: SNF              Anticipated d/c date is: 1 day              Caleb Kim currently is not medically stable to d/c. Barriers: Not Clinically Stable-   Code Status :  -  Code Status: Full Code   Family Communication:   Discussed with Caleb Kim's mother and Caleb Kim's wife    DVT Prophylaxis  :   - SCDs   enoxaparin (LOVENOX) injection 40 mg Start: 05/15/22 1000 SCDs Start: 05/15/22 0200  Lab Results  Component Value Date   PLT 250 05/20/2022   Inpatient Medications  Scheduled Meds:  amoxicillin-clavulanate  1 tablet Oral Q12H   dextromethorphan-guaiFENesin  1 tablet Oral BID   enoxaparin (LOVENOX) injection  40 mg Subcutaneous Q24H   feeding supplement  237 mL Oral TID PC   folic acid  1 mg Oral Daily   influenza vac split quadrivalent PF  0.5 mL Intramuscular Tomorrow-1000   ipratropium-albuterol  3 mL Nebulization TID   mirtazapine  15 mg Oral QHS   multivitamin with minerals  1 tablet Oral Daily   nicotine  14 mg Transdermal Daily   pantoprazole  40 mg Oral Daily   pneumococcal 20-valent conjugate vaccine  0.5 mL Intramuscular Tomorrow-1000   thiamine  100 mg Oral Daily   Or   thiamine  100 mg Intravenous Daily   venlafaxine  75 mg Oral BID WC   Continuous Infusions:  magnesium sulfate bolus IVPB 4 g (05/20/22 1044)   PRN Meds:.acetaminophen,  albuterol, guaiFENesin-dextromethorphan, ondansetron (ZOFRAN) IV, phenol   Anti-infectives (From admission, onward)    Start     Dose/Rate Route Frequency Ordered Stop   05/19/22 1800  amoxicillin-clavulanate (AUGMENTIN) 875-125 MG per tablet 1 tablet        1 tablet Oral Every 12 hours 05/19/22 1632         Subjective: Konye Hundley remains agreeable to SNF placement, more alert  today, feeding self today.   Objective: Vitals:   05/19/22 2056 05/20/22 0417 05/20/22 0726 05/20/22 0834  BP: 106/78 126/74  115/78  Pulse: (!) 116 96 100 (!) 105  Resp: 17 18 14 18   Temp: (!) 97.3 F (36.3 C) 99.1 F (37.3 C)  98.3 F (36.8 C)  TempSrc:    Oral  SpO2: 94% 95% 94% 95%  Weight:      Height:        Intake/Output Summary (Last 24 hours) at 05/20/2022 1148 Last data filed at 05/20/2022 1100 Gross per 24 hour  Intake 580 ml  Output 900 ml  Net -320 ml   Filed Weights   05/15/22 0204 05/17/22 0603 05/18/22 0500  Weight: 52.1 kg 52.9 kg 54.8 kg   Physical Exam  Physical Exam Vitals and nursing note reviewed.  Constitutional:      Appearance: Normal appearance. He is not toxic-appearing.  HENT:     Head: Normocephalic and atraumatic.     Mouth/Throat:     Mouth: Mucous membranes are moist.  Eyes:     Extraocular Movements: Extraocular movements intact.     Pupils: Pupils are equal, round, and reactive to light.  Cardiovascular:     Rate and Rhythm: Regular rhythm.  Pulmonary:     Effort: Pulmonary effort is normal.     Breath sounds: Normal breath sounds.  Abdominal:     General: Abdomen is flat.     Palpations: Abdomen is soft.  Skin:    General: Skin is warm and dry.  Neurological:     Mental Status: He is alert and oriented to person, place, and time.      Data Reviewed: I have personally reviewed following labs and imaging studies  CBC: Recent Labs  Lab 05/14/22 1507 05/15/22 0237 05/19/22 1618 05/20/22 0641  WBC 9.5 6.8 22.1* 15.5*  NEUTROABS  --   --   --  13.1*  HGB 14.5 13.6 8.0* 11.0*  HCT 40.4 38.5* 22.3* 30.5*  MCV 85.2 87.3 91.0 88.4  PLT 218 200 333 [id number]   Basic Metabolic Panel: Recent Labs  Lab 05/14/22 1931 05/15/22 0237 05/16/22 0408 05/17/22 0623 05/18/22 0553 05/19/22 1005 05/19/22 1618 05/20/22 0410  NA 117*   < > 126* 128* 127* 127* 124* 126*  K 3.9   < > 3.8 3.0* 3.5 3.6 3.4* 3.6  CL 75*   < > 91* 99  97* 91* 91* 94*  CO2 25   < > 26 22 21* 25 21* 22  GLUCOSE 84   < > 92 94 80 103* 121* 94  BUN 7   < > <5* 6 6 6 8 9   CREATININE 0.55*   < > 0.63 0.54* 0.46* 0.62 0.63 0.51*  CALCIUM 8.2*   < > 8.3* 7.9* 8.3* 8.4* 7.9* 8.1*  MG 2.1  --   --   --   --   --   --  1.6*  PHOS  --   --  2.0*  --  2.2* 3.2  --   --    < > =  values in this interval not displayed.   GFR: Estimated Creatinine Clearance: 77.1 mL/min (A) (by C-G formula based on SCr of 0.51 mg/dL (L)). Liver Function Tests: Recent Labs  Lab 05/14/22 1507 05/15/22 0237 05/16/22 0408 05/17/22 0623 05/18/22 0553 05/19/22 1005 05/20/22 0410  AST 135* 104*  --  46*  --   --  44*  ALT 72* 60*  --  41  --   --  38  ALKPHOS 115 99  --  81  --   --  100  BILITOT 1.6* 1.4*  --  1.8*  --   --  1.7*  PROT 8.2* 6.7  --  5.8*  --   --  6.7  ALBUMIN 3.4* 2.9* 3.0* 2.4* 2.4* 2.5* 2.4*   Recent Results (from the past 240 hour(s))  MRSA Next Gen by PCR, Nasal     Status: None   Collection Time: 05/15/22  1:59 AM   Specimen: Nasal Mucosa; Nasal Swab  Result Value Ref Range Status   MRSA by PCR Next Gen NOT DETECTED NOT DETECTED Final    Comment: (NOTE) The GeneXpert MRSA Assay (FDA approved for NASAL specimens only), is one component of a comprehensive MRSA colonization surveillance program. It is not intended to diagnose MRSA infection nor to guide or monitor treatment for MRSA infections. Test performance is not FDA approved in patients less than 33 years old. Performed at [facility], [address] [place], [place] [address]   Culture, blood (Routine X 2) w Reflex to ID Panel     Status: None (Preliminary result)   Collection Time: 05/19/22  4:18 PM   Specimen: BLOOD RIGHT ARM  Result Value Ref Range Status   Specimen Description BLOOD RIGHT ARM  Final   Special Requests   Final    BOTTLES DRAWN AEROBIC AND ANAEROBIC Blood Culture adequate volume   Culture   Final    NO GROWTH < 12 HOURS Performed at [facility],  [address], [place], [place] [address]    Report Status PENDING  Incomplete  Culture, blood (Routine X 2) w Reflex to ID Panel     Status: None (Preliminary result)   Collection Time: 05/19/22  4:18 PM   Specimen: BLOOD LEFT ARM  Result Value Ref Range Status   Specimen Description BLOOD LEFT ARM  Final   Special Requests   Final    BOTTLES DRAWN AEROBIC AND ANAEROBIC Blood Culture adequate volume   Culture   Final    NO GROWTH < 12 HOURS Performed at [facility], [address], [place], [place] [address]    Report Status PENDING  Incomplete    Radiology Studies: DG CHEST PORT 1 VIEW  Result Date: 05/20/2022 CLINICAL DATA:  Hyponatremia EXAM: PORTABLE CHEST 1 VIEW COMPARISON:  05/14/2022 FINDINGS: Normal cardiac silhouette. Increased density at the LEFT and RIGHT lung base medially. Air bronchograms posterior the heart. Upper lungs are clear. No pneumothorax. No acute osseous abnormality. IMPRESSION: Increased bibasilar densities representing pneumonia, aspiration pneumonitis, or atelectasis. Electronically Signed   By: Suzy Bouchard M.D.   On: 05/20/2022 08:15    Scheduled Meds:  amoxicillin-clavulanate  1 tablet Oral Q12H   dextromethorphan-guaiFENesin  1 tablet Oral BID   enoxaparin (LOVENOX) injection  40 mg Subcutaneous Q24H   feeding supplement  237 mL Oral TID PC   folic acid  1 mg Oral Daily   influenza vac split quadrivalent PF  0.5 mL Intramuscular Tomorrow-1000   ipratropium-albuterol  3 mL Nebulization  TID   mirtazapine  15 mg Oral QHS   multivitamin with minerals  1 tablet Oral Daily   nicotine  14 mg Transdermal Daily   pantoprazole  40 mg Oral Daily   pneumococcal 20-valent conjugate vaccine  0.5 mL Intramuscular Tomorrow-1000   thiamine  100 mg Oral Daily   Or   thiamine  100 mg Intravenous Daily   venlafaxine  75 mg Oral BID WC   Continuous Infusions:  magnesium sulfate bolus IVPB 4 g (05/20/22 1044)    LOS: 6 days   Irwin Brakeman M.D on 05/20/2022 at  11:48 AM  Go to [email or web address] - for contact info  [facility] - Office  [phone]  If 7PM-7AM, please contact night-coverage [email or web address] 05/20/2022, 11:48 AM   /.

## 2022-05-21 MED ORDER — FOLIC ACID 1 MG PO TABS: 1.0000 mg | ORAL_TABLET | Freq: Every day | ORAL | 1 refills | Status: DC

## 2022-05-21 MED ORDER — PANTOPRAZOLE SODIUM 40 MG PO TBEC: 40.0000 mg | DELAYED_RELEASE_TABLET | Freq: Every day | ORAL | 1 refills | Status: DC

## 2022-05-21 MED ORDER — DM-GUAIFENESIN ER 30-600 MG PO TB12: 1.0000 | ORAL_TABLET | Freq: Two times a day (BID) | ORAL | 0 refills | Status: AC

## 2022-05-21 MED ORDER — AMOXICILLIN-POT CLAVULANATE 875-125 MG PO TABS: 1.0000 | ORAL_TABLET | Freq: Two times a day (BID) | ORAL | 0 refills | Status: AC

## 2022-05-21 MED ORDER — ADULT MULTIVITAMIN W/MINERALS CH: 1.0000 | ORAL_TABLET | Freq: Every day | ORAL | Status: DC

## 2022-05-21 MED ORDER — VENLAFAXINE HCL ER 37.5 MG PO CP24: 37.5000 mg | ORAL_CAPSULE | Freq: Every day | ORAL | 1 refills | Status: DC

## 2022-05-21 MED ORDER — IPRATROPIUM-ALBUTEROL 0.5-2.5 (3) MG/3ML IN SOLN: 3.0000 mL | Freq: Two times a day (BID) | RESPIRATORY_TRACT | Status: DC

## 2022-05-21 MED ORDER — VITAMIN B-1 100 MG PO TABS: 100.0000 mg | ORAL_TABLET | Freq: Every day | ORAL | 1 refills | Status: DC

## 2022-05-21 MED ORDER — IPRATROPIUM-ALBUTEROL 0.5-2.5 (3) MG/3ML IN SOLN: 3.0000 mL | Freq: Two times a day (BID) | RESPIRATORY_TRACT | 1 refills | Status: DC

## 2022-05-21 NOTE — Progress Notes (Addendum)
Wife is at bedside and helping PT  take shower and plan for discharge. Nursing staff provided hygiene supplies, and will await new orders from Attending,

## 2022-05-21 NOTE — Discharge Summary (Signed)
Physician Discharge Summary  Caleb Kim WJX:[id number] DOB: [birth year] DOA: 05/14/2022  Admit date: 05/14/2022 Discharge date: 05/21/2022  Admitted From:  Home  Disposition: Home with HH (declines SNF)  Recommendations for Outpatient Follow-up:  Establish with PCP in 1-2 weeks Please avoid all alcohol and recreational substances  Home Health: PT   Discharge Condition: STABLE   CODE STATUS: FULL DIET: regular    Brief Hospitalization Summary: Please see all hospital notes, images, labs for full details of the hospitalization. Brief Narrative:  - 61 y.o. male with medical history significant of alcohol abuse, tobacco abuse admitted on 05/14/2022 with dehydration hyponatremia and hypochloremia in the setting of ongoing alcohol abuse   -Assessment and Plan:   1)HypoNatremia/hypochloremia/hypophosphatemia---- -Electrolytes improving with replacements -Avoid excessive free water -Monitor electrolytes closely -Continue to monitor electrolytes   2)Alcoholic Hepatitis  -LFTs improved   3)Alcohol Abuse--- patient's wife states that patient drinks 6 to 9 cans of beer per day each 1 about 40 ounces  -Treated with benzos per [facility]WA protocol -Continue multivitamin, folic acid, thiamine -DT symptoms have resolved   4)Nausea/Vomiting and Hiccups----resolved -Continue as needed antiemetics   5)Tobacco Abuse--- cessation advised nicotine patch as ordered   6) social/ethics--plan of care discussed with patient, patient's wife and patient's mother at bedside -Patient declines referral to [facility] for possible alcohol rehab -Patient remains a full code   7)Generalized weakness/ambulatory dysfunction/ fall risk ----very weak, incontinent of feces in bed -Significant difficulties with mobility related ADLs -Physical therapy eval appreciated, after re-evaluation on 05/21/2022 recommending HH   8)Aspiration Pneumonia - Fevers/Cough/leukocytosis --- patient is a smoker, given recent altered mentation  in setting of delirium tremens cannot rule out aspiration episodes -Given fevers and leukocytosis treat empirically with Augmentin for aspiration pneumonia -Chest x-ray on 05/20/2022 - reviewed, continue to complete course of antibiotics -Incentive spirometry, mucolytics and bronchodilators as ordered- -blood cultures no growth  -complete 3 more days of oral augmentin to complete course   Discharge Diagnoses:  Principal Problem:   Hyponatremia Active Problems:   Tobacco abuse   Vomiting   Intractable hiccups   Dehydration   Alcohol abuse   Discharge Instructions:  Allergies as of 05/21/2022   No Known Allergies      Medication List     TAKE these medications    amoxicillin-clavulanate 875-125 MG tablet Commonly known as: AUGMENTIN Take 1 tablet by mouth every 12 (twelve) hours for 3 days.   dextromethorphan-guaiFENesin 30-600 MG 12hr tablet Commonly known as: MUCINEX DM Take 1 tablet by mouth 2 (two) times daily for 3 days.   folic acid 1 MG tablet Commonly known as: FOLVITE Take 1 tablet (1 mg total) by mouth daily. Start taking on: May 22, 2022   ipratropium-albuterol 0.5-2.5 (3) MG/3ML Soln Commonly known as: DUONEB Take 3 mLs by nebulization 2 (two) times daily.   multivitamin with minerals Tabs tablet Take 1 tablet by mouth daily. Start taking on: May 22, 2022   pantoprazole 40 MG tablet Commonly known as: PROTONIX Take 1 tablet (40 mg total) by mouth daily. Start taking on: May 22, 2022   thiamine 100 MG tablet Commonly known as: Vitamin B-1 Take 1 tablet (100 mg total) by mouth daily. Start taking on: May 22, 2022   venlafaxine XR 37.5 MG 24 hr capsule Commonly known as: Effexor XR Take 1 capsule (37.5 mg total) by mouth daily.               Durable Medical Equipment  (From admission, onward)  Start     Ordered   05/21/22 1323  For home use only DME Nebulizer machine  Once       Question Answer  Comment  Patient needs a nebulizer to treat with the following condition COPD (chronic obstructive pulmonary disease) (HCC)   Length of Need Lifetime      05/21/22 1322   05/21/22 1317  For home use only DME Walker rolling  Once       Question Answer Comment  Walker: With 5 Inch Wheels   Patient needs a walker to treat with the following condition Gait instability      05/21/22 1316            Contact information for follow-up providers     Primary Care Provider. Schedule an appointment as soon as possible for a visit in 2 week(s).   Why: Hospital Follow Up             Contact information for after-discharge care     Destination     [facility] .   Service: Skilled Nursing Contact information: [address] [place] [address] [phone]                    No Known Allergies Allergies as of 05/21/2022   No Known Allergies      Medication List     TAKE these medications    amoxicillin-clavulanate 875-125 MG tablet Commonly known as: AUGMENTIN Take 1 tablet by mouth every 12 (twelve) hours for 3 days.   dextromethorphan-guaiFENesin 30-600 MG 12hr tablet Commonly known as: MUCINEX DM Take 1 tablet by mouth 2 (two) times daily for 3 days.   folic acid 1 MG tablet Commonly known as: FOLVITE Take 1 tablet (1 mg total) by mouth daily. Start taking on: May 22, 2022   ipratropium-albuterol 0.5-2.5 (3) MG/3ML Soln Commonly known as: DUONEB Take 3 mLs by nebulization 2 (two) times daily.   multivitamin with minerals Tabs tablet Take 1 tablet by mouth daily. Start taking on: May 22, 2022   pantoprazole 40 MG tablet Commonly known as: PROTONIX Take 1 tablet (40 mg total) by mouth daily. Start taking on: May 22, 2022   thiamine 100 MG tablet Commonly known as: Vitamin B-1 Take 1 tablet (100 mg total) by mouth daily. Start taking on: May 22, 2022   venlafaxine XR 37.5 MG 24  hr capsule Commonly known as: Effexor XR Take 1 capsule (37.5 mg total) by mouth daily.               Durable Medical Equipment  (From admission, onward)           Start     Ordered   05/21/22 1323  For home use only DME Nebulizer machine  Once       Question Answer Comment  Patient needs a nebulizer to treat with the following condition COPD (chronic obstructive pulmonary disease) (HCC)   Length of Need Lifetime      05/21/22 1322   05/21/22 1317  For home use only DME Walker rolling  Once       Question Answer Comment  Walker: With 5 Inch Wheels   Patient needs a walker to treat with the following condition Gait instability      05/21/22 1316            Procedures/Studies: DG CHEST PORT 1 VIEW  Result Date: 05/20/2022 CLINICAL DATA:  Hyponatremia EXAM: PORTABLE CHEST  1 VIEW COMPARISON:  05/14/2022 FINDINGS: Normal cardiac silhouette. Increased density at the LEFT and RIGHT lung base medially. Air bronchograms posterior the heart. Upper lungs are clear. No pneumothorax. No acute osseous abnormality. IMPRESSION: Increased bibasilar densities representing pneumonia, aspiration pneumonitis, or atelectasis. Electronically Signed   By: Suzy Bouchard M.D.   On: 05/20/2022 08:15   DG Chest Portable 1 View  Result Date: 05/14/2022 CLINICAL DATA:  hyponatremia EXAM: PORTABLE CHEST 1 VIEW COMPARISON:  Chest x-ray 12/31/2019 FINDINGS: The heart and mediastinal contours are within normal limits. No focal consolidation. No pulmonary edema. No pleural effusion. No pneumothorax. No acute osseous abnormality. IMPRESSION: No active disease. Electronically Signed   By: Iven Finn M.D.   On: 05/14/2022 18:50     Subjective: Pt says he wants to go home.  He has no complaints. He has been ambulating better.   Discharge Exam: Vitals:   05/21/22 0529 05/21/22 0812  BP: 123/77   Pulse: 88 90  Resp: 18 16  Temp: 98.3 F (36.8 C)   SpO2: 97% 94%   Vitals:   05/20/22 2031  05/20/22 2120 05/21/22 0529 05/21/22 0812  BP:  (!) 136/95 123/77   Pulse:  (!) 107 88 90  Resp:  18 18 16   Temp:  98.1 F (36.7 C) 98.3 F (36.8 C)   TempSrc:      SpO2: 93% 93% 97% 94%  Weight:      Height:        Vitals and nursing note reviewed.  Constitutional:      Appearance: emaciated appearance. He is not toxic-appearing.  HENT:     Head: Normocephalic and atraumatic.     Mouth/Throat:     Mouth: Mucous membranes are moist.  Eyes:     Extraocular Movements: Extraocular movements intact.     Pupils: Pupils are equal, round, and reactive to light.  Cardiovascular:     Rate and Rhythm: Regular rhythm.  Pulmonary:     Effort: Pulmonary effort is normal.     Breath sounds: rales RLL  Abdominal:     General: Abdomen is flat.     Palpations: Abdomen is soft.  Skin:    General: Skin is warm and dry.  Neurological:     Mental Status: He is alert and oriented to person, place, and time.      The results of significant diagnostics from this hospitalization (including imaging, microbiology, ancillary and laboratory) are listed below for reference.     Microbiology: Recent Results (from the past 240 hour(s))  MRSA Next Gen by PCR, Nasal     Status: None   Collection Time: 05/15/22  1:59 AM   Specimen: Nasal Mucosa; Nasal Swab  Result Value Ref Range Status   MRSA by PCR Next Gen NOT DETECTED NOT DETECTED Final    Comment: (NOTE) The GeneXpert MRSA Assay (FDA approved for NASAL specimens only), is one component of a comprehensive MRSA colonization surveillance program. It is not intended to diagnose MRSA infection nor to guide or monitor treatment for MRSA infections. Test performance is not FDA approved in patients less than 57 years old. Performed at [facility], [address], [place], [place] [address]   Culture, blood (Routine X 2) w Reflex to ID Panel     Status: None (Preliminary result)   Collection Time: 05/19/22  4:18 PM   Specimen: BLOOD RIGHT ARM   Result Value Ref Range Status   Specimen Description BLOOD RIGHT ARM  Final   Special  Requests   Final    BOTTLES DRAWN AEROBIC AND ANAEROBIC Blood Culture adequate volume   Culture   Final    NO GROWTH 2 DAYS Performed at [facility], [address], [place], [place] [address]    Report Status PENDING  Incomplete  Culture, blood (Routine X 2) w Reflex to ID Panel     Status: None (Preliminary result)   Collection Time: 05/19/22  4:18 PM   Specimen: BLOOD LEFT ARM  Result Value Ref Range Status   Specimen Description BLOOD LEFT ARM  Final   Special Requests   Final    BOTTLES DRAWN AEROBIC AND ANAEROBIC Blood Culture adequate volume   Culture   Final    NO GROWTH 2 DAYS Performed at [facility], [address] [place], [place] [address]    Report Status PENDING  Incomplete     Labs: BNP (last 3 results) No results for input(s): "BNP" in the last 8760 hours. Basic Metabolic Panel: Recent Labs  Lab 05/14/22 1931 05/15/22 0237 05/16/22 0408 05/17/22 0623 05/18/22 0553 05/19/22 1005 05/19/22 1618 05/20/22 0410  NA 117*   < > 126* 128* 127* 127* 124* 126*  K 3.9   < > 3.8 3.0* 3.5 3.6 3.4* 3.6  CL 75*   < > 91* 99 97* 91* 91* 94*  CO2 25   < > 26 22 21* 25 21* 22  GLUCOSE 84   < > 92 94 80 103* 121* 94  BUN 7   < > <5* 6 6 6 8 9   CREATININE 0.55*   < > 0.63 0.54* 0.46* 0.62 0.63 0.51*  CALCIUM 8.2*   < > 8.3* 7.9* 8.3* 8.4* 7.9* 8.1*  MG 2.1  --   --   --   --   --   --  1.6*  PHOS  --   --  2.0*  --  2.2* 3.2  --   --    < > = values in this interval not displayed.   Liver Function Tests: Recent Labs  Lab 05/14/22 1507 05/15/22 0237 05/16/22 0408 05/17/22 0623 05/18/22 0553 05/19/22 1005 05/20/22 0410  AST 135* 104*  --  46*  --   --  44*  ALT 72* 60*  --  41  --   --  38  ALKPHOS 115 99  --  81  --   --  100  BILITOT 1.6* 1.4*  --  1.8*  --   --  1.7*  PROT 8.2* 6.7  --  5.8*  --   --  6.7  ALBUMIN 3.4* 2.9* 3.0* 2.4* 2.4* 2.5* 2.4*   Recent Labs   Lab 05/14/22 1507  LIPASE 35   Recent Labs  Lab 05/20/22 0410  AMMONIA 38*   CBC: Recent Labs  Lab 05/14/22 1507 05/15/22 0237 05/19/22 1618 05/20/22 0641  WBC 9.5 6.8 22.1* 15.5*  NEUTROABS  --   --   --  13.1*  HGB 14.5 13.6 8.0* 11.0*  HCT 40.4 38.5* 22.3* 30.5*  MCV 85.2 87.3 91.0 88.4  PLT 218 200 333 250   Cardiac Enzymes: No results for input(s): "CKTOTAL", "CKMB", "CKMBINDEX", "TROPONINI" in the last 168 hours. BNP: Invalid input(s): "POCBNP" CBG: Recent Labs  Lab 05/17/22 0725  GLUCAP 89   D-Dimer No results for input(s): "DDIMER" in the last 72 hours. Hgb A1c No results for input(s): "HGBA1C" in the last 72 hours. Lipid Profile No results for input(s): "CHOL", "HDL", "LDLCALC", "TRIG", "CHOLHDL", "  LDLDIRECT" in the last 72 hours. Thyroid function studies No results for input(s): "TSH", "T4TOTAL", "T3FREE", "THYROIDAB" in the last 72 hours.  Invalid input(s): "FREET3" Anemia work up No results for input(s): "VITAMINB12", "FOLATE", "FERRITIN", "TIBC", "IRON", "RETICCTPCT" in the last 72 hours. Urinalysis    Component Value Date/Time   COLORURINE YELLOW 05/14/2022 1835   APPEARANCEUR CLEAR 05/14/2022 1835   LABSPEC 1.011 05/14/2022 1835   PHURINE 6.0 05/14/2022 1835   GLUCOSEU NEGATIVE 05/14/2022 1835   HGBUR NEGATIVE 05/14/2022 1835   BILIRUBINUR NEGATIVE 05/14/2022 1835   KETONESUR 5 (A) 05/14/2022 1835   PROTEINUR NEGATIVE 05/14/2022 1835   NITRITE NEGATIVE 05/14/2022 1835   LEUKOCYTESUR NEGATIVE 05/14/2022 1835   Sepsis Labs Recent Labs  Lab 05/14/22 1507 05/15/22 0237 05/19/22 1618 05/20/22 0641  WBC 9.5 6.8 22.1* 15.5*   Microbiology Recent Results (from the past 240 hour(s))  MRSA Next Gen by PCR, Nasal     Status: None   Collection Time: 05/15/22  1:59 AM   Specimen: Nasal Mucosa; Nasal Swab  Result Value Ref Range Status   MRSA by PCR Next Gen NOT DETECTED NOT DETECTED Final    Comment: (NOTE) The GeneXpert MRSA Assay (FDA  approved for NASAL specimens only), is one component of a comprehensive MRSA colonization surveillance program. It is not intended to diagnose MRSA infection nor to guide or monitor treatment for MRSA infections. Test performance is not FDA approved in patients less than 59 years old. Performed at [facility], [address] [place], [place] [address]   Culture, blood (Routine X 2) w Reflex to ID Panel     Status: None (Preliminary result)   Collection Time: 05/19/22  4:18 PM   Specimen: BLOOD RIGHT ARM  Result Value Ref Range Status   Specimen Description BLOOD RIGHT ARM  Final   Special Requests   Final    BOTTLES DRAWN AEROBIC AND ANAEROBIC Blood Culture adequate volume   Culture   Final    NO GROWTH 2 DAYS Performed at [facility], [address] [place], [place] [address]    Report Status PENDING  Incomplete  Culture, blood (Routine X 2) w Reflex to ID Panel     Status: None (Preliminary result)   Collection Time: 05/19/22  4:18 PM   Specimen: BLOOD LEFT ARM  Result Value Ref Range Status   Specimen Description BLOOD LEFT ARM  Final   Special Requests   Final    BOTTLES DRAWN AEROBIC AND ANAEROBIC Blood Culture adequate volume   Culture   Final    NO GROWTH 2 DAYS Performed at [facility], [address] [place], [place] [address]    Report Status PENDING  Incomplete   Time coordinating discharge: 40 mins  SIGNED:  Standley Dakinslanford Dail Lerew, MD  [facility] 05/21/2022, 1:23 PM How to contact the [facility] Attending or Consulting provider 7A - 7P or covering provider during after hours 7P -7A, for this patient?  Check the care team in [facility] and look for a) attending/consulting [facility] provider listed and b) the [facility] team listed Log into [email or web address] and use 's universal password to access. If you do not have the password, please contact the hospital operator. Locate the [facility] provider you are looking for under [facility] and page to a number that you can be directly  reached. If you still have difficulty reaching the provider, please page the [facility] (Director on Call) for the Hospitalists listed on amion for assistance.

## 2022-05-21 NOTE — Progress Notes (Signed)
Occupational Therapy Treatment Patient Details Name: Caleb Kim MRN: [id number] DOB: February 08, 1961 Today's Date: 05/21/2022   History of present illness 61 y.o. male admitted on 05/14/22 due to hiccups and vomiting. PMH significant for alcohol abuse and tobacco abuse.   OT comments  Pt agreeable to OT and PT co-treatment. Pt demonstrates much improved mobility with supervision to min G assist for transfers and ambulation without AD. Pt demonstrates slow labored movement but reports this is similar to his baseline ambulation. Pt able to complete lower body dressing with modified independence today as well as toilet hygiene without assist. Supervision to min G for standing tasks but seated tasks mostly mod I per clinical judgment. Recommendation changed to home health OT with family support. Pt will benefit from continued OT in the hospital and recommended venue below to increase strength, balance, and endurance for safe ADL's.      Recommendations for follow up therapy are one component of a multi-disciplinary discharge planning process, led by the attending physician.  Recommendations may be updated based on patient status, additional functional criteria and insurance authorization.    Follow Up Recommendations  Home health OT    Assistance Recommended at Discharge Intermittent Supervision/Assistance  Patient can return home with the following  A little help with walking and/or transfers;A little help with bathing/dressing/bathroom;Assistance with cooking/housework;Assist for transportation;Help with stairs or ramp for entrance   Equipment Recommendations  None recommended by OT    Recommendations for Other Services      Precautions / Restrictions Restrictions Weight Bearing Restrictions: No       Mobility Bed Mobility Overal bed mobility: Modified Independent             General bed mobility comments: Supine to sit without assist.    Transfers Overall transfer level:  Needs assistance Equipment used: None Transfers: Sit to/from Stand, Bed to chair/wheelchair/BSC Sit to Stand: Supervision     Step pivot transfers: Supervision, Min guard     General transfer comment: No physical assist needed; labored movement which pt reports at baseline.     Balance Overall balance assessment: Needs assistance Sitting-balance support: No upper extremity supported, Feet supported Sitting balance-Leahy Scale: Good Sitting balance - Comments: seated EOB   Standing balance support: No upper extremity supported, During functional activity Standing balance-Leahy Scale: Fair Standing balance comment: no AD                           ADL either performed or assessed with clinical judgement   ADL Overall ADL's : Needs assistance/impaired     Grooming: Supervision/safety;Min guard;Standing;Wash/dry hands Grooming Details (indicate cue type and reason): Pt able to stand at the sink to wash hands without AD.     Lower Body Bathing: Modified independent;Sitting/lateral leans Lower Body Bathing Details (indicate cue type and reason): Able to doff and don socks at EOB with extended time but not physical assist.         Toilet Transfer: Supervision/safety;Min guard;Ambulation Toilet Transfer Details (indicate cue type and reason): Pt able to ambualte to toilet from ambulation in hall and then to chair without AD. Toileting- Clothing Manipulation and Hygiene: Modified independent Toileting - Clothing Manipulation Details (indicate cue type and reason): Pt completed peri-care with door to bathroom closed.     Functional mobility during ADLs: Supervision/safety;Min guard General ADL Comments: Pt able to ambualte >50 feet in hall and return to room without AD.      Cognition Arousal/Alertness: Awake/alert  Behavior During Therapy: WFL for tasks assessed/performed Overall Cognitive Status: Within Functional Limits for tasks assessed                                                              Pertinent Vitals/ Pain       Pain Assessment Pain Assessment: No/denies pain                                                          Frequency  Min 1X/week        Progress Toward Goals  OT Goals(current goals can now be found in the care plan section)  Progress towards OT goals: Progressing toward goals  Acute Rehab OT Goals Patient Stated Goal: to go home OT Goal Formulation: With patient Time For Goal Achievement: 06/01/22 Potential to Achieve Goals: Good ADL Goals Pt Will Perform Grooming: with modified independence;standing Pt Will Perform Lower Body Bathing: with modified independence;sitting/lateral leans;sit to/from stand Pt Will Perform Lower Body Dressing: with modified independence;sitting/lateral leans;sit to/from stand (met goal) Pt Will Transfer to Toilet: with modified independence;ambulating Pt Will Perform Toileting - Clothing Manipulation and hygiene: with modified independence;sitting/lateral leans;sit to/from stand (met goal)  Plan Discharge plan needs to be updated    Co-evaluation    PT/OT/SLP Co-Evaluation/Treatment: Yes Reason for Co-Treatment: To address functional/ADL transfers   OT goals addressed during session: ADL's and self-care                          End of Session    OT Visit Diagnosis: Unsteadiness on feet (R26.81);Other abnormalities of gait and mobility (R26.89);Muscle weakness (generalized) (M62.81)   Activity Tolerance Patient tolerated treatment well   Patient Left in chair;with call bell/phone within reach   Nurse Communication          Time: 0865-7846 OT Time Calculation (min): 15 min  Charges: OT General Charges $OT Visit: 1 Visit (no charge due to co-treat with PT.)  Larey Seat OT, MOT  Larey Seat 05/21/2022, 9:51 AM

## 2022-05-21 NOTE — Progress Notes (Signed)
Wife at desk. She stated that the patient is moving in the room and has been up on his own tonight. She wants to request that PT re-evaluate him and see if he can have home health PT since he is moving a lot better than he has been.

## 2022-05-21 NOTE — Discharge Instructions (Signed)
IMPORTANT INFORMATION: PAY CLOSE ATTENTION   PHYSICIAN DISCHARGE INSTRUCTIONS  Follow with Primary care provider  Patient, No Pcp Per  and other consultants as instructed by your Hospitalist Physician  SEEK MEDICAL CARE OR RETURN TO EMERGENCY ROOM IF SYMPTOMS COME BACK, WORSEN OR NEW PROBLEM DEVELOPS   Please note: You were cared for by a hospitalist during your hospital stay. Every effort will be made to forward records to your primary care provider.  You can request that your primary care provider send for your hospital records if they have not received them.  Once you are discharged, your primary care physician will handle any further medical issues. Please note that NO REFILLS for any discharge medications will be authorized once you are discharged, as it is imperative that you return to your primary care physician (or establish a relationship with a primary care physician if you do not have one) for your post hospital discharge needs so that they can reassess your need for medications and monitor your lab values.  Please get a complete blood count and chemistry panel checked by your Primary MD at your next visit, and again as instructed by your Primary MD.  Get Medicines reviewed and adjusted: Please take all your medications with you for your next visit with your Primary MD  Laboratory/radiological data: Please request your Primary MD to go over all hospital tests and procedure/radiological results at the follow up, please ask your primary care provider to get all Hospital records sent to his/her office.  In some cases, they will be blood work, cultures and biopsy results pending at the time of your discharge. Please request that your primary care provider follow up on these results.  If you are diabetic, please bring your blood sugar readings with you to your follow up appointment with primary care.    Please call and make your follow up appointments as soon as possible.    Also Note  the following: If you experience worsening of your admission symptoms, develop shortness of breath, life threatening emergency, suicidal or homicidal thoughts you must seek medical attention immediately by calling 911 or calling your MD immediately  if symptoms less severe.  You must read complete instructions/literature along with all the possible adverse reactions/side effects for all the Medicines you take and that have been prescribed to you. Take any new Medicines after you have completely understood and accpet all the possible adverse reactions/side effects.   Do not drive when taking Pain medications or sleeping medications (Benzodiazepines)  Do not take more than prescribed Pain, Sleep and Anxiety Medications. It is not advisable to combine anxiety,sleep and pain medications without talking with your primary care practitioner  Special Instructions: If you have smoked or chewed Tobacco  in the last 2 yrs please stop smoking, stop any regular Alcohol  and or any Recreational drug use.  Wear Seat belts while driving.  Do not drive if taking any narcotic, mind altering or controlled substances or recreational drugs or alcohol.        
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
## 2022-05-21 NOTE — Progress Notes (Signed)
Per attending, patient made the decision to d/c home.

## 2022-05-21 NOTE — Progress Notes (Signed)
Patient  sat in recliner til midnight then requested to go to bed. Patient's wife in with him this evening. No complaints of pain. Respirations remain unlabored.

## 2022-05-22 ENCOUNTER — Other Ambulatory Visit: Payer: Self-pay | Admitting: Family Medicine

## 2022-05-22 MED ORDER — NEBULIZER SYSTEM ALL-IN-ONE MISC: 1.0000 | 99 refills | Status: DC

## 2022-05-22 NOTE — Progress Notes (Signed)
Nebulizer / supplies RX sent to [facility].    I called [facility] and family picked up all his prescriptions except the over the counter items that medicaid does not cover.

## 2022-05-22 NOTE — Progress Notes (Signed)
Notified wife to pick up nebulizer at [facility].

## 2022-05-24 ENCOUNTER — Other Ambulatory Visit: Payer: Self-pay

## 2022-05-24 ENCOUNTER — Emergency Department (HOSPITAL_COMMUNITY): Payer: Medicaid Other

## 2022-05-24 ENCOUNTER — Emergency Department (HOSPITAL_COMMUNITY)
Admission: EM | Admit: 2022-05-24 | Discharge: 2022-05-24 | Disposition: A | Discharge status: Home / Self Care | Payer: Medicaid Other | Attending: Emergency Medicine | Admitting: Emergency Medicine

## 2022-05-24 ENCOUNTER — Encounter (HOSPITAL_COMMUNITY): Payer: Self-pay

## 2022-05-24 DIAGNOSIS — R197 Diarrhea, unspecified: Secondary | ICD-10-CM | POA: Insufficient documentation

## 2022-05-24 DIAGNOSIS — R0689 Other abnormalities of breathing: Secondary | ICD-10-CM | POA: Insufficient documentation

## 2022-05-24 DIAGNOSIS — J189 Pneumonia, unspecified organism: Secondary | ICD-10-CM

## 2022-05-24 DIAGNOSIS — R059 Cough, unspecified: Secondary | ICD-10-CM | POA: Diagnosis present

## 2022-05-24 DIAGNOSIS — E871 Hypo-osmolality and hyponatremia: Secondary | ICD-10-CM | POA: Diagnosis not present

## 2022-05-24 LAB — CBC WITH DIFFERENTIAL/PLATELET
Abs Immature Granulocytes: 0.15 10*3/uL — ABNORMAL HIGH (ref 0.00–0.07)
Basophils Absolute: 0 10*3/uL (ref 0.0–0.1)
Basophils Relative: 1 %
Eosinophils Absolute: 0 10*3/uL (ref 0.0–0.5)
Eosinophils Relative: 0 %
HCT: 29.3 % — ABNORMAL LOW (ref 39.0–52.0)
Hemoglobin: 10 g/dL — ABNORMAL LOW (ref 13.0–17.0)
Immature Granulocytes: 2 %
Lymphocytes Relative: 13 %
Lymphs Abs: 1 10*3/uL (ref 0.7–4.0)
MCH: 31.8 pg (ref 26.0–34.0)
MCHC: 34.1 g/dL (ref 30.0–36.0)
MCV: 93.3 fL (ref 80.0–100.0)
Monocytes Absolute: 0.8 10*3/uL (ref 0.1–1.0)
Monocytes Relative: 12 %
Neutro Abs: 5.1 10*3/uL (ref 1.7–7.7)
Neutrophils Relative %: 72 %
Platelets: 347 10*3/uL (ref 150–400)
RBC: 3.14 MIL/uL — ABNORMAL LOW (ref 4.22–5.81)
RDW: 12.7 % (ref 11.5–15.5)
WBC: 7.1 10*3/uL (ref 4.0–10.5)
nRBC: 0 % (ref 0.0–0.2)

## 2022-05-24 LAB — COMPREHENSIVE METABOLIC PANEL
ALT: 66 U/L — ABNORMAL HIGH (ref 0–44)
AST: 72 U/L — ABNORMAL HIGH (ref 15–41)
Albumin: 2.7 g/dL — ABNORMAL LOW (ref 3.5–5.0)
Alkaline Phosphatase: 130 U/L — ABNORMAL HIGH (ref 38–126)
Anion gap: 11 (ref 5–15)
BUN: 10 mg/dL (ref 6–20)
CO2: 25 mmol/L (ref 22–32)
Calcium: 8.9 mg/dL (ref 8.9–10.3)
Chloride: 97 mmol/L — ABNORMAL LOW (ref 98–111)
Creatinine, Ser: 0.63 mg/dL (ref 0.61–1.24)
GFR, Estimated: >60 mL/min (ref >60)
Glucose, Bld: 93 mg/dL (ref 70–99)
Potassium: 3.7 mmol/L (ref 3.5–5.1)
Sodium: 133 mmol/L — ABNORMAL LOW (ref 135–145)
Total Bilirubin: 1.4 mg/dL — ABNORMAL HIGH (ref 0.3–1.2)
Total Protein: 7.6 g/dL (ref 6.5–8.1)

## 2022-05-24 LAB — CULTURE, BLOOD (ROUTINE X 2)
Culture: NO GROWTH
Culture: NO GROWTH
Special Requests: ADEQUATE
Special Requests: ADEQUATE

## 2022-05-24 LAB — BRAIN NATRIURETIC PEPTIDE: B Natriuretic Peptide: 119 pg/mL — ABNORMAL HIGH (ref 0.0–100.0)

## 2022-05-24 LAB — TROPONIN I (HIGH SENSITIVITY): Troponin I (High Sensitivity): 18 ng/L — ABNORMAL HIGH (ref <18)

## 2022-05-24 LAB — ETHANOL: Alcohol, Ethyl (B): <10 mg/dL (ref <10)

## 2022-05-24 MED ORDER — LEVOFLOXACIN 750 MG PO TABS
750.0000 mg | ORAL_TABLET | Freq: Once | ORAL | Status: AC
  Administered 2022-05-24: 750 mg via ORAL
  Filled 2022-05-24: qty 1

## 2022-05-24 MED ORDER — LEVOFLOXACIN 750 MG PO TABS: 750.0000 mg | ORAL_TABLET | Freq: Every day | ORAL | 0 refills | Status: DC

## 2022-05-24 NOTE — ED Triage Notes (Signed)
Pov from home. Cc of cough. Was dc from hospital 3 days ago, back because he isnt any better.

## 2022-05-24 NOTE — ED Notes (Signed)
92% after ambulating

## 2022-05-24 NOTE — Discharge Instructions (Signed)
Begin taking Levaquin as prescribed.  Continue over-the-counter medications as needed for relief of symptoms.  Return to the ER if symptoms significantly worsen or change.

## 2022-05-24 NOTE — ED Provider Notes (Signed)
[facility] EMERGENCY DEPARTMENT Provider Note   CSN: [id number] Arrival date & time: 05/24/22  0054     History  Chief Complaint  Patient presents with   Cough    Caleb Kim is a 60 y.o. male.  Patient is a 60 year old male with past medical history of alcohol abuse.  Patient presenting today for evaluation of cough.  He was recently diagnosed with pneumonia and was admitted to the hospital here.  He was discharged 3 days ago.  He has completed his antibiotic, but continues to complain of chills and cough that occur at night.  He also reports having nonbloody diarrhea.  Symptoms are worsened with lying flat.  There are no alleviating factors.  The history is provided by the patient.       Home Medications Prior to Admission medications   Medication Sig Start Date End Date Taking? Authorizing Provider  amoxicillin-clavulanate (AUGMENTIN) 875-125 MG tablet Take 1 tablet by mouth every 12 (twelve) hours for 3 days. 05/21/22 05/24/22  Johnson, Clanford L, MD  dextromethorphan-guaiFENesin (MUCINEX DM) 30-600 MG 12hr tablet Take 1 tablet by mouth 2 (two) times daily for 3 days. 05/21/22 05/24/22  Johnson, Clanford L, MD  folic acid (FOLVITE) 1 MG tablet Take 1 tablet (1 mg total) by mouth daily. 05/22/22   Johnson, Clanford L, MD  ipratropium-albuterol (DUONEB) 0.5-2.5 (3) MG/3ML SOLN Take 3 mLs by nebulization 2 (two) times daily. 05/21/22   Murlean Iba, MD  Multiple Vitamin (MULTIVITAMIN WITH MINERALS) TABS tablet Take 1 tablet by mouth daily. 05/22/22   Murlean Iba, MD  Nebulizer System All-In-One MISC 1 Device by Does not apply route as directed. 05/22/22   Johnson, Clanford L, MD  pantoprazole (PROTONIX) 40 MG tablet Take 1 tablet (40 mg total) by mouth daily. 05/22/22   Johnson, Clanford L, MD  thiamine (VITAMIN B-1) 100 MG tablet Take 1 tablet (100 mg total) by mouth daily. 05/22/22   Johnson, Clanford L, MD  venlafaxine XR (EFFEXOR XR) 37.5 MG 24 hr capsule Take 1  capsule (37.5 mg total) by mouth daily. 05/21/22 05/21/23  Murlean Iba, MD      Allergies    Patient has no known allergies.    Review of Systems   Review of Systems  All other systems reviewed and are negative.   Physical Exam Updated Vital Signs BP 117/75   Pulse 84   Temp 98.8 F (37.1 C)   Resp 17   Ht 5\' 6"  (1.676 m)   Wt 55 kg   SpO2 95%   BMI 19.57 kg/m  Physical Exam Vitals and nursing note reviewed.  Constitutional:      General: He is not in acute distress.    Appearance: He is well-developed. He is not diaphoretic.  HENT:     Head: Normocephalic and atraumatic.  Cardiovascular:     Rate and Rhythm: Normal rate and regular rhythm.     Heart sounds: No murmur heard.    No friction rub.  Pulmonary:     Effort: Pulmonary effort is normal. No respiratory distress.     Breath sounds: Rales present. No wheezing.     Comments: There are rales in the bases bilaterally, right greater than left Abdominal:     General: Bowel sounds are normal. There is no distension.     Palpations: Abdomen is soft.     Tenderness: There is no abdominal tenderness.  Musculoskeletal:        General: Normal range of  motion.     Cervical back: Normal range of motion and neck supple.     Right lower leg: No edema.     Left lower leg: No edema.  Skin:    General: Skin is warm and dry.  Neurological:     Mental Status: He is alert and oriented to person, place, and time.     Coordination: Coordination normal.     ED Results / Procedures / Treatments   Labs (all labs ordered are listed, but only abnormal results are displayed) Labs Reviewed  COMPREHENSIVE METABOLIC PANEL  CBC WITH DIFFERENTIAL/PLATELET  BRAIN NATRIURETIC PEPTIDE  ETHANOL  TROPONIN I (HIGH SENSITIVITY)    EKG None  Radiology No results found.  Procedures Procedures    Medications Ordered in ED Medications - No data to display  ED Course/ Medical Decision Making/ A&P  Patient is a  61 year old male presenting with continued cough and chills.  These occur mostly at night.  He was recently admitted for pneumonia, then discharged.  He was also found to have hyponatremia during his last admission, likely related to excessive alcohol consumption.  Patient's work-up today shows unremarkable electrolytes.  His chest x-ray shows a persistent infiltrate in the left lower lobe.  Patient will be given Levaquin and see if this helps.  He is not hypoxic, is not acutely ill-appearing, and in no distress.  I feel as though he can safely be discharged with outpatient follow-up and as needed return.  Final Clinical Impression(s) / ED Diagnoses Final diagnoses:  None    Rx / DC Orders ED Discharge Orders     None         Geoffery Lyons, MD 05/24/22 [phone]

## 2022-12-04 ENCOUNTER — Encounter: Payer: Self-pay | Admitting: Family Medicine

## 2022-12-04 ENCOUNTER — Ambulatory Visit (INDEPENDENT_AMBULATORY_CARE_PROVIDER_SITE_OTHER): Payer: Medicaid Other | Admitting: Family Medicine

## 2022-12-04 VITALS — BP 126/81 | HR 96 | Ht 66.0 in | Wt 122.0 lb

## 2022-12-04 DIAGNOSIS — Z1329 Encounter for screening for other suspected endocrine disorder: Secondary | ICD-10-CM

## 2022-12-04 DIAGNOSIS — R7301 Impaired fasting glucose: Secondary | ICD-10-CM

## 2022-12-04 DIAGNOSIS — Z1211 Encounter for screening for malignant neoplasm of colon: Secondary | ICD-10-CM

## 2022-12-04 DIAGNOSIS — G8929 Other chronic pain: Secondary | ICD-10-CM

## 2022-12-04 DIAGNOSIS — R0602 Shortness of breath: Secondary | ICD-10-CM | POA: Diagnosis not present

## 2022-12-04 DIAGNOSIS — Z1321 Encounter for screening for nutritional disorder: Secondary | ICD-10-CM

## 2022-12-04 DIAGNOSIS — M25552 Pain in left hip: Secondary | ICD-10-CM

## 2022-12-04 DIAGNOSIS — I10 Essential (primary) hypertension: Secondary | ICD-10-CM

## 2022-12-04 DIAGNOSIS — Z1159 Encounter for screening for other viral diseases: Secondary | ICD-10-CM

## 2022-12-04 DIAGNOSIS — Z13220 Encounter for screening for lipoid disorders: Secondary | ICD-10-CM

## 2022-12-04 MED ORDER — CYCLOBENZAPRINE HCL 5 MG PO TABS
5.0000 mg | ORAL_TABLET | Freq: Three times a day (TID) | ORAL | 1 refills | Status: DC | PRN
Start: 2022-12-04 — End: 2022-12-11

## 2022-12-04 MED ORDER — ALBUTEROL SULFATE HFA 108 (90 BASE) MCG/ACT IN AERS
2.0000 | INHALATION_SPRAY | Freq: Four times a day (QID) | RESPIRATORY_TRACT | 2 refills | Status: AC | PRN
Start: 2022-12-04 — End: ?
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
## 2022-12-04 NOTE — Assessment & Plan Note (Signed)
Strongly encourage PT for his decreased ROM and instability  Recent Xray on 09/14/2022 showed No fracture, dislocation, or radiopaque foreign body. Moderate osteoarthritis  Started Flexeril 5 mg as initial therapy. Can take OTC tylenol arthritis as well for pain. Patient reported was on oxycodone before- referral placed to pain management  Explained to patient Non pharmacological interventions include the use of ice or heat, rest, recommend range of motion exercises, gentle stretching. Follow up for worsening or persistent symptoms. Patient verbalizes understanding regarding plan of care and all questions answered.

## 2022-12-04 NOTE — Patient Instructions (Signed)
It was pleasure meeting with you today. Please take medications as prescribed. Follow up with your primary health provider if any health concerns arises. If symptoms worsen please contact your primary care provider and/or visit the emergency department.  

## 2022-12-09 LAB — CMP14+EGFR
ALT: 70 IU/L — ABNORMAL HIGH (ref 0–44)
AST: 87 IU/L — ABNORMAL HIGH (ref 0–40)
Albumin: 4.2 g/dL (ref 3.8–4.9)
Alkaline Phosphatase: 83 IU/L (ref 44–121)
BUN/Creatinine Ratio: 6 — ABNORMAL LOW (ref 9–20)
BUN: 5 mg/dL — ABNORMAL LOW (ref 6–24)
CO2: 22 mmol/L (ref 20–29)
Chloride: 104 mmol/L (ref 96–106)
Potassium: 4.2 mmol/L (ref 3.5–5.2)

## 2022-12-09 LAB — LIPID PANEL
Cholesterol, Total: 189 mg/dL (ref 100–199)
HDL: 70 mg/dL (ref >39)

## 2022-12-09 LAB — CBC WITH DIFFERENTIAL/PLATELET
Basos: 1 %
Hematocrit: 39.1 % (ref 37.5–51.0)
Lymphs: 48 %
MCV: 95 fL (ref 79–97)
Platelets: 233 10*3/uL (ref 150–450)
RBC: 4.1 x10E6/uL — ABNORMAL LOW (ref 4.14–5.80)
RDW: 11.6 % (ref 11.6–15.4)
WBC: 4.2 10*3/uL (ref 3.4–10.8)

## 2022-12-09 LAB — HEMOGLOBIN A1C: Hgb A1c MFr Bld: 4.9 % (ref 4.8–5.6)

## 2022-12-10 ENCOUNTER — Telehealth: Payer: Self-pay | Admitting: Family Medicine

## 2022-12-10 NOTE — Telephone Encounter (Signed)
Pt called in regards to pain meds.Said he went to the pharmacy and was told they do not have them.

## 2022-12-10 NOTE — Telephone Encounter (Signed)
Pt called in regards to pain med's, He said he went to the pharmacy and they never received the prescription.

## 2022-12-11 ENCOUNTER — Other Ambulatory Visit: Payer: Self-pay | Admitting: Family Medicine

## 2022-12-11 DIAGNOSIS — G8929 Other chronic pain: Secondary | ICD-10-CM

## 2022-12-11 LAB — CBC WITH DIFFERENTIAL/PLATELET
Basophils Absolute: 0 10*3/uL (ref 0.0–0.2)
EOS (ABSOLUTE): 0 10*3/uL (ref 0.0–0.4)
Eos: 1 %
Hemoglobin: 13.3 g/dL (ref 13.0–17.7)
Immature Grans (Abs): 0 10*3/uL (ref 0.0–0.1)
Immature Granulocytes: 0 %
Lymphocytes Absolute: 2 10*3/uL (ref 0.7–3.1)
MCH: 32.4 pg (ref 26.6–33.0)
MCHC: 34 g/dL (ref 31.5–35.7)
Monocytes Absolute: 0.6 10*3/uL (ref 0.1–0.9)
Monocytes: 14 %
Neutrophils Absolute: 1.5 10*3/uL (ref 1.4–7.0)
Neutrophils: 36 %

## 2022-12-11 LAB — HEMOGLOBIN A1C: Est. average glucose Bld gHb Est-mCnc: 94 mg/dL

## 2022-12-11 LAB — CMP14+EGFR
Albumin/Globulin Ratio: 1.3 (ref 1.2–2.2)
Bilirubin Total: 0.5 mg/dL (ref 0.0–1.2)
Calcium: 9.4 mg/dL (ref 8.7–10.2)
Creatinine, Ser: 0.78 mg/dL (ref 0.76–1.27)
Globulin, Total: 3.3 g/dL (ref 1.5–4.5)
Glucose: 86 mg/dL (ref 70–99)
Sodium: 143 mmol/L (ref 134–144)
Total Protein: 7.5 g/dL (ref 6.0–8.5)
eGFR: 103 mL/min/{1.73_m2} (ref >59)

## 2022-12-11 LAB — TSH+FREE T4
Free T4: 1.54 ng/dL (ref 0.82–1.77)
TSH: 0.825 u[IU]/mL (ref 0.450–4.500)

## 2022-12-11 LAB — LIPID PANEL
Chol/HDL Ratio: 2.7 ratio (ref 0.0–5.0)
LDL Chol Calc (NIH): 97 mg/dL (ref 0–99)
Triglycerides: 129 mg/dL (ref 0–149)
VLDL Cholesterol Cal: 22 mg/dL (ref 5–40)

## 2022-12-11 LAB — HEPATITIS C ANTIBODY: Hep C Virus Ab: NONREACTIVE

## 2022-12-11 LAB — MICROALBUMIN / CREATININE URINE RATIO
Creatinine, Urine: 70.5 mg/dL
Microalb/Creat Ratio: <4 mg/g creat (ref 0–29)
Microalbumin, Urine: <3 ug/mL

## 2022-12-11 LAB — VITAMIN D 25 HYDROXY (VIT D DEFICIENCY, FRACTURES): Vit D, 25-Hydroxy: 16.4 ng/mL — ABNORMAL LOW (ref 30.0–100.0)

## 2022-12-11 MED ORDER — CYCLOBENZAPRINE HCL 5 MG PO TABS
5.0000 mg | ORAL_TABLET | Freq: Three times a day (TID) | ORAL | 1 refills | Status: DC | PRN
Start: 2022-12-11 — End: 2022-12-14

## 2022-12-11 NOTE — Telephone Encounter (Signed)
Sent medications to [facility]
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
## 2022-12-11 NOTE — Progress Notes (Signed)
Please inform patient,  Vitamin D levels low, I advise to taking  over the counter supplements of vitamin D 1000 IU/day to prevent low vitamin D levels. Consuming Vitamin D rich food sources include fish, salmon, sardines, egg yolks, red meat, liver, oranges, soy milk.    Liver enzymes elevated possible due to signs of increased build up fat in the liver. I encourage healthy eating habits- low carb high protein diet, avoid fatty foods, smoking and alcohol intake. Maintain an excercise routine to minimum of 150 minuties a week. Follow up in 4 months to recheck labs.

## 2022-12-14 ENCOUNTER — Other Ambulatory Visit: Payer: Self-pay

## 2022-12-14 ENCOUNTER — Other Ambulatory Visit: Payer: Self-pay | Admitting: Family Medicine

## 2022-12-14 ENCOUNTER — Telehealth: Payer: Self-pay | Admitting: Family Medicine

## 2022-12-14 ENCOUNTER — Ambulatory Visit (HOSPITAL_COMMUNITY): Payer: Medicaid Other | Attending: Family Medicine | Admitting: Physical Therapy

## 2022-12-14 DIAGNOSIS — E876 Hypokalemia: Secondary | ICD-10-CM

## 2022-12-14 DIAGNOSIS — K219 Gastro-esophageal reflux disease without esophagitis: Secondary | ICD-10-CM

## 2022-12-14 DIAGNOSIS — G8929 Other chronic pain: Secondary | ICD-10-CM | POA: Insufficient documentation

## 2022-12-14 DIAGNOSIS — M6281 Muscle weakness (generalized): Secondary | ICD-10-CM | POA: Diagnosis present

## 2022-12-14 DIAGNOSIS — M25552 Pain in left hip: Secondary | ICD-10-CM | POA: Diagnosis present

## 2022-12-14 DIAGNOSIS — E871 Hypo-osmolality and hyponatremia: Secondary | ICD-10-CM

## 2022-12-14 DIAGNOSIS — J302 Other seasonal allergic rhinitis: Secondary | ICD-10-CM

## 2022-12-14 DIAGNOSIS — F3289 Other specified depressive episodes: Secondary | ICD-10-CM

## 2022-12-14 MED ORDER — ADULT MULTIVITAMIN W/MINERALS CH
1.0000 | ORAL_TABLET | Freq: Every day | ORAL | Status: DC
Start: 2022-12-14 — End: 2023-08-05

## 2022-12-14 MED ORDER — DICLOFENAC SODIUM 75 MG PO TBEC
75.0000 mg | DELAYED_RELEASE_TABLET | Freq: Two times a day (BID) | ORAL | 0 refills | Status: DC
Start: 2022-12-14 — End: 2023-08-05

## 2022-12-14 MED ORDER — VENLAFAXINE HCL ER 37.5 MG PO CP24
37.5000 mg | ORAL_CAPSULE | Freq: Every day | ORAL | 1 refills | Status: DC
Start: 2022-12-14 — End: 2023-04-12

## 2022-12-14 MED ORDER — IPRATROPIUM-ALBUTEROL 0.5-2.5 (3) MG/3ML IN SOLN
3.0000 mL | Freq: Two times a day (BID) | RESPIRATORY_TRACT | 1 refills | Status: DC
Start: 2022-12-14 — End: 2023-03-09

## 2022-12-14 MED ORDER — FOLIC ACID 1 MG PO TABS
1.0000 mg | ORAL_TABLET | Freq: Every day | ORAL | 1 refills | Status: DC
Start: 2022-12-14 — End: 2023-08-05

## 2022-12-14 MED ORDER — VITAMIN B-1 100 MG PO TABS
100.0000 mg | ORAL_TABLET | Freq: Every day | ORAL | 1 refills | Status: DC
Start: 2022-12-14 — End: 2023-08-05

## 2022-12-14 MED ORDER — CYCLOBENZAPRINE HCL 10 MG PO TABS: 10.0000 mg | ORAL_TABLET | Freq: Three times a day (TID) | ORAL | 0 refills | Status: DC | PRN

## 2022-12-14 MED ORDER — PANTOPRAZOLE SODIUM 40 MG PO TBEC
40.0000 mg | DELAYED_RELEASE_TABLET | Freq: Every day | ORAL | 1 refills | Status: DC
Start: 2022-12-14 — End: 2023-04-12

## 2022-12-14 NOTE — Telephone Encounter (Signed)
Pt needs all meds / refills  sent to [facility] on [address]   Pt is requesting refill on all active meds   folic acid (FOLVITE) 1 MG tablet  ipratropium-albuterol (DUONEB) 0.5-2.5 (3) MG/3ML SOLN  levofloxacin (LEVAQUIN) 750 MG tablet  Multiple Vitamin (MULTIVITAMIN WITH MINERALS) TABS tablet  Nebulizer System All-In-One MISC  pantoprazole (PROTONIX) 40 MG tablet  Thiamine (VITAMIN B-1) 100 MG tablet  venlafaxine XR (EFFEXOR XR) 37.5 MG 24 hr capsule   Pt also states that the muscle relaxer sent is is not helping at all. Patient wants a call back in regard.

## 2022-12-14 NOTE — Telephone Encounter (Signed)
Refills sent except, levaquin... pt also stating muscle relaxer not helping

## 2022-12-14 NOTE — Telephone Encounter (Signed)
Increased Flexeril to 10 mg for pain and added diclofenac 75 mg 2x daily

## 2022-12-18 NOTE — Telephone Encounter (Signed)
Refills sent

## 2022-12-23 ENCOUNTER — Telehealth (HOSPITAL_COMMUNITY): Payer: Self-pay

## 2022-12-23 ENCOUNTER — Encounter (HOSPITAL_COMMUNITY): Payer: Medicaid Other

## 2022-12-23 NOTE — Telephone Encounter (Signed)
No show, attempted to call but unable to get through with no ability to leave message concerning missed apt.   Becky Sax, LPTA/CLT; [place] [facility] [phone]

## 2022-12-28 ENCOUNTER — Ambulatory Visit (HOSPITAL_COMMUNITY): Payer: Medicaid Other | Attending: Family Medicine | Admitting: Physical Therapy

## 2022-12-28 DIAGNOSIS — M6281 Muscle weakness (generalized): Secondary | ICD-10-CM | POA: Insufficient documentation

## 2022-12-28 DIAGNOSIS — M25552 Pain in left hip: Secondary | ICD-10-CM | POA: Diagnosis present

## 2022-12-28 NOTE — Therapy (Signed)
OUTPATIENT PHYSICAL THERAPY LOWER EXTREMITY EVALUATION   Patient Name: Caleb Kim MRN: [id number] DOB:1960/12/25, 62 y.o., male Today's Date: 12/28/2022  END OF SESSION:  PT End of Session - 12/28/22 0949     Visit Number 2    Number of Visits 12    Date for PT Re-Evaluation 01/22/23    Authorization Type medicaid united health auth put in    PT Start Time 0910    PT Stop Time 0949    PT Time Calculation (min) 39 min    Activity Tolerance Patient tolerated treatment well    Behavior During Therapy Caleb Kim for tasks assessed/performed             No past medical history on file. Past Surgical History:  Procedure Laterality Date   EXPLORATORY LAPAROTOMY     Patient Active Problem List   Diagnosis Date Noted   Left hip pain 12/04/2022   Dehydration 05/15/2022   Alcohol abuse 05/15/2022   Alcohol withdrawal (HCC) 01/02/2020   Hyponatremia 12/31/2019   Hypokalemia 12/31/2019   Alcoholic intoxication without complication (HCC) 12/31/2019   Tobacco abuse 12/31/2019   Vomiting 12/31/2019   Intractable hiccups 12/31/2019   Anorexia 12/31/2019   Transaminasemia    Closed fracture of right side of body of mandible with routine healing 05/10/2017   Closed fracture of right zygomatic arch (HCC) 05/10/2017    PCP: Caleb Kim  REFERRING PROVIDER: Rica Records, Kim  REFERRING DIAG:  Diagnosis  [phone] (ICD-10-CM) - Chronic left hip pain    THERAPY DIAG:  Diagnosis  M25.552,G89.29 (ICD-10-CM) - Chronic left hip pain    Rationale for Evaluation and Treatment: Rehabilitation  ONSET DATE: initial injury 1998 with gradual increase of pain    SUBJECTIVE STATEMENT: Pt states that he did some of the exercises but when his hip started to hurt he stopped doing them.    PERTINENT HISTORY: N/A PAIN:  Are you having pain? Yes: NPRS scale: 6/10; Pain description: aches Aggravating factors: activity Relieving factors: medication    PRECAUTIONS: None  WEIGHT BEARING RESTRICTIONS: No  FALLS:  Has patient fallen in last 6 months? No  LIVING ENVIRONMENT: Lives with: lives with their spouse Lives in: House/apartment Stairs: No Has following equipment at home: None  OCCUPATION: disabled  PLOF: Independent  PATIENT GOALS: less pain  NEXT MD VISIT: not scheduled   OBJECTIVE:   DIAGNOSTIC FINDINGS: Per MD moderate OA   MUSCLE LENGTH: Hamstrings: Right 170 deg; Left 150 deg POSTURE: rounded shoulders, forward head, decreased lumbar lordosis, and increased thoracic kyphosis   LOWER EXTREMITY ROM: wfl   LOWER EXTREMITY MMT:  MMT Right eval Left eval  Hip flexion 5 2+  Hip extension 4- 2+  Hip abduction 4 2  Hip adduction    Hip internal rotation    Hip external rotation    Knee flexion  3  Knee extension 5 3  Ankle dorsiflexion 5 3+  Ankle plantarflexion    Ankle inversion    Ankle eversion     (Blank rows = not tested)    FUNCTIONAL TESTS:  30 seconds chair stand test:  6;  ( 11 for age and sex is poor) 2 minute walk test: 492 ft with no assistive device.   Single leg stance:  Rt: 15"   , LT 10" unsteady on LT    TODAY'S TREATMENT:  DATE: 12/28/22 Wall arch x 10 Sit to stand x 10 Squat x 10 Step up 6" step x 10 B  Slant board stretch 1 min x 2  Side stepping with green theraband x 2 RT  Single leg stance 3 reps each x 10" B  Leg press 4PL x 10  Nustep hills 3 ,  level 4 x 7 minutes    PATIENT EDUCATION:  Education details: HEP Person educated: Patient Education method: Programmer, multimedia, Verbal cues, and Handouts Education comprehension: returned demonstration  HOME EXERCISE PROGRAM: Access Code: [email or web address] URL: [email or web address] Date: 12/14/2022 Prepared by: Caleb Kim  Exercises - Seated Long Arc Quad  - 3 x daily - 7 x weekly -  1 sets - 10 reps - 5" hold - Seated Ankle Dorsiflexion AROM  - 3 x daily - 7 x weekly - 1 sets - 10 reps - 5" hold - Supine Bridge  - 2 x daily - 7 x weekly - 1 sets - 10 reps - 5 hold  ASSESSMENT:  CLINICAL IMPRESSION: Patient is a 62 y.o. male who was seen today for for chronic LT hip pain.  Therapist encouraged pt to complete HEP in order to improve hip strength to  decreased his pain.  Introduced strengthening and balance exercises to program and gave pt side stepping with green theraband for HEP.  Caleb Kim will benefit from skilled PT to address the above and maximize his functional ability.   OBJECTIVE IMPAIRMENTS: decreased activity tolerance, decreased balance, difficulty walking, and decreased strength.   ACTIVITY LIMITATIONS: carrying, lifting, sitting, standing, and locomotion level  PARTICIPATION LIMITATIONS: cleaning, shopping, community activity, occupation, and yard work  PERSONAL FACTORS: Behavior pattern and Time since onset of injury/illness/exacerbation are also affecting patient's functional outcome. (Pt grunting throughout treatment)  REHAB POTENTIAL: Fair    CLINICAL DECISION MAKING: Stable/uncomplicated  EVALUATION COMPLEXITY: Low   GOALS: Goals reviewed with patient? Yes  SHORT TERM GOALS: Target date: 01/01/23 PT to be I in HEP in order to decrease his pain to no greater than an 8/10 Baseline: Goal status:   2.  PT core and LE strength to be increased 1/2 grade to be able to come sit to stand 10 x in 30 " for improved activity tolerance Baseline:  Goal status: IN PROGRESS  3.  Pt to be able to stand on Lt LE for 15 seconds for decreased risk of falling  Baseline:  Goal status: IN PROGRESS    LONG TERM GOALS: Target date: 01/22/23  PT to be I in an advanced HEP to decrease his pain to no greater than a 6/10 Baseline:  Goal status: IN PROGRESS  2.  PT core and LE strength to be increased 1 grade to be able to be able to come sit to stand from a  couch without UE assist  Baseline:  Goal status: IN PROGRESS  3.  PT to be able to walk for 20 minutes to allow short shopping trips  Baseline:  Goal status: IN PROGRESS   PLAN:  Pt to begin a strengthening/ balance program for his core and Lt LE   PT FREQUENCY: 2x/week  PT DURATION: 6 weeks  PLANNED INTERVENTIONS: Therapeutic exercises, Balance training, Patient/Family education, Self Care, and Manual therapy  Plan:    lunge, , tandem stance; increase activity as tolerated.   Caleb Kim, PT CLT [phone]  9:57

## 2022-12-30 ENCOUNTER — Ambulatory Visit (HOSPITAL_COMMUNITY): Payer: Medicaid Other | Admitting: Physical Therapy

## 2022-12-30 DIAGNOSIS — M25552 Pain in left hip: Secondary | ICD-10-CM | POA: Diagnosis not present

## 2022-12-30 DIAGNOSIS — M6281 Muscle weakness (generalized): Secondary | ICD-10-CM

## 2022-12-30 NOTE — Therapy (Signed)
OUTPATIENT PHYSICAL THERAPY LOWER EXTREMITY EVALUATION   Patient Name: Quentrell Rodeman MRN: [id number] DOB:04-09-1961, 62 y.o., male Today's Date: 12/30/2022  END OF SESSION:  PT End of Session - 12/30/22 0855     Visit Number 3    Number of Visits 12    Date for PT Re-Evaluation 01/22/23    Authorization Type medicaid united health auth put in    PT Start Time 0815    PT Stop Time 0855    PT Time Calculation (min) 40 min    Activity Tolerance Patient tolerated treatment well    Behavior During Therapy [facility] for tasks assessed/performed                 No past medical history on file. Past Surgical History:  Procedure Laterality Date   EXPLORATORY LAPAROTOMY     Patient Active Problem List   Diagnosis Date Noted   Left hip pain 12/04/2022   Dehydration 05/15/2022   Alcohol abuse 05/15/2022   Alcohol withdrawal (HCC) 01/02/2020   Hyponatremia 12/31/2019   Hypokalemia 12/31/2019   Alcoholic intoxication without complication (HCC) 12/31/2019   Tobacco abuse 12/31/2019   Vomiting 12/31/2019   Intractable hiccups 12/31/2019   Anorexia 12/31/2019   Transaminasemia    Closed fracture of right side of body of mandible with routine healing 05/10/2017   Closed fracture of right zygomatic arch (HCC) 05/10/2017    PCP: Rica Records, FNP  REFERRING PROVIDER: Rica Records, FNP  REFERRING DIAG:  Diagnosis  [phone] (ICD-10-CM) - Chronic left hip pain    THERAPY DIAG:  Diagnosis  M25.552,G89.29 (ICD-10-CM) - Chronic left hip pain    Rationale for Evaluation and Treatment: Rehabilitation  ONSET DATE: initial injury 1998 with gradual increase of pain    SUBJECTIVE STATEMENT: Pt states that he did some of the stretches and has taken his pain medication so his hip is doing pretty good today.   PERTINENT HISTORY: N/A PAIN:  Are you having pain? Yes: NPRS scale: 5/10; Pain description: aches Aggravating factors: activity Relieving  factors: medication   PRECAUTIONS: None  WEIGHT BEARING RESTRICTIONS: No  FALLS:  Has patient fallen in last 6 months? No  LIVING ENVIRONMENT: Lives with: lives with their spouse Lives in: House/apartment Stairs: No Has following equipment at home: None  OCCUPATION: disabled  PLOF: Independent  PATIENT GOALS: less pain  NEXT MD VISIT: not scheduled   OBJECTIVE:   DIAGNOSTIC FINDINGS: Per MD moderate OA   MUSCLE LENGTH: Hamstrings: Right 170 deg; Left 150 deg POSTURE: rounded shoulders, forward head, decreased lumbar lordosis, and increased thoracic kyphosis   LOWER EXTREMITY ROM: wfl   LOWER EXTREMITY MMT:  MMT Right eval Left eval  Hip flexion 5 2+  Hip extension 4- 2+  Hip abduction 4 2  Hip adduction    Hip internal rotation    Hip external rotation    Knee flexion  3  Knee extension 5 3  Ankle dorsiflexion 5 3+  Ankle plantarflexion    Ankle inversion    Ankle eversion     (Blank rows = not tested)    FUNCTIONAL TESTS:  30 seconds chair stand test:  6;  ( 11 for age and sex is poor) 2 minute walk test: 492 ft with no assistive device.   Single leg stance:  Rt: 15"   , LT 10" unsteady on LT    TODAY'S TREATMENT:  DATE:  12/30/22: Heel/toe raise x 15 Piriformis stretch chair, 1 minute x 2 reps B Thomas stretch 1 minute x 2 reps B  Sit to stand x 15 Step up 6" step x 10 B  Slant board stretch 1 min x 2  Side stepping with green theraband x 2 RT  Single leg stance 3 reps each x 10" B  Leg press 4PL x 10  Thomas stretch 2 x 1 minute Nustep hills 3 ,  level 4 x 7 minutes  12/28/22 Wall arch x 10 Sit to stand x 10 Squat x 10 Step up 6" step x 10 B  Slant board stretch 1 min x 2  Side stepping with green theraband x 2 RT  Single leg stance 3 reps each x 10" B  Leg press 4PL x 10  Nustep hills 3 ,  level 4 x 7 minutes     PATIENT EDUCATION:  Education details: HEP Person educated: Patient Education method: Programmer, multimedia, Verbal cues, and Handouts Education comprehension: returned demonstration  HOME EXERCISE PROGRAM: Access Code: [email or web address] URL: [email or web address] 12/30/22 - Standing Heel Raises  - 2 x daily - 7 x weekly - 1 sets - 10 reps - 5" hold - Sit to Stand  - 2 x daily - 7 x weekly - 1 sets - 10 reps - Seated Piriformis Stretch  - 1 x daily - 7 x weekly - 1 sets - 2 reps - 60" hold - Hip Flexor Stretch at Edge of Bed  - 1 x daily - 7 x weekly - 1 sets - 2 reps - 60" hold Date: 12/14/2022 Prepared by: Virgina Organ  Exercises - Seated Long Arc Quad  - 3 x daily - 7 x weekly - 1 sets - 10 reps - 5" hold - Seated Ankle Dorsiflexion AROM  - 3 x daily - 7 x weekly - 1 sets - 10 reps - 5" hold - Supine Bridge  - 2 x daily - 7 x weekly - 1 sets - 10 reps - 5 hold  ASSESSMENT:  CLINICAL IMPRESSION: Pt has to be cued to keep good posture multiple times throughout the session.  Therapist explained why good posture when exercising is so important.  Pt pain is slowly improving, therapist added to pt HEP as he is now completing on a more regular basis and seeing improvement.  .  Therapist encouraged pt to complete HEP in order to improve hip strength to  decreased his pain.   Mr. Wiltsie will benefit from skilled PT to address the above and maximize his functional ability.   OBJECTIVE IMPAIRMENTS: decreased activity tolerance, decreased balance, difficulty walking, and decreased strength.   ACTIVITY LIMITATIONS: carrying, lifting, sitting, standing, and locomotion level  PARTICIPATION LIMITATIONS: cleaning, shopping, community activity, occupation, and yard work  PERSONAL FACTORS: Behavior pattern and Time since onset of injury/illness/exacerbation are also affecting patient's functional outcome. (Pt grunting throughout treatment)  REHAB POTENTIAL: Fair    CLINICAL DECISION MAKING:  Stable/uncomplicated  EVALUATION COMPLEXITY: Low   GOALS: Goals reviewed with patient? Yes  SHORT TERM GOALS: Target date: 01/01/23 PT to be I in HEP in order to decrease his pain to no greater than an 8/10 Baseline: Goal status: MET  2.  PT core and LE strength to be increased 1/2 grade to be able to come sit to stand 10 x in 30 " for improved activity tolerance Baseline:  Goal status: MET  3.  Pt to be able to stand on Lt LE for  15 seconds for decreased risk of falling  Baseline:  Goal status: IN PROGRESS    LONG TERM GOALS: Target date: 01/22/23  PT to be I in an advanced HEP to decrease his pain to no greater than a 6/10 Baseline:  Goal status: IN PROGRESS  2.  PT core and LE strength to be increased 1 grade to be able to be able to come sit to stand from a couch without UE assist  Baseline:  Goal status: IN PROGRESS  3.  PT to be able to walk for 20 minutes to allow short shopping trips  Baseline:  Goal status: IN PROGRESS   PLAN:  Pt to begin a strengthening/ balance program for his core and Lt LE   PT FREQUENCY: 2x/week  PT DURATION: 6 weeks  PLANNED INTERVENTIONS: Therapeutic exercises, Balance training, Patient/Family education, Self Care, and Manual therapy  Plan:    lunge, , tandem stance; increase activity as tolerated.   Virgina Organ, PT CLT [phone]  9:00 AM

## 2023-01-04 ENCOUNTER — Encounter (HOSPITAL_COMMUNITY): Payer: Self-pay

## 2023-01-04 ENCOUNTER — Ambulatory Visit (HOSPITAL_COMMUNITY): Payer: Medicaid Other

## 2023-01-04 DIAGNOSIS — M25552 Pain in left hip: Secondary | ICD-10-CM

## 2023-01-04 DIAGNOSIS — M6281 Muscle weakness (generalized): Secondary | ICD-10-CM

## 2023-01-04 NOTE — Therapy (Signed)
OUTPATIENT PHYSICAL THERAPY LOWER EXTREMITY TREATMENT   Patient Name: Caleb Kim MRN: [id number] DOB:07-31-1961, 62 y.o., male Today's Date: 01/04/2023  END OF SESSION: END OF SESSION:   PT End of Session - 01/04/23 0909     Visit Number 4    Number of Visits 12    Date for PT Re-Evaluation 01/22/23    Authorization Type medicaid [employer]    Authorization Time Period no authorization required    PT Start Time 0858    PT Stop Time 0940    PT Time Calculation (min) 42 min    Activity Tolerance Patient tolerated treatment well    Behavior During Therapy [facility] for tasks assessed/performed              History reviewed. No pertinent past medical history. Past Surgical History:  Procedure Laterality Date   EXPLORATORY LAPAROTOMY     Patient Active Problem List   Diagnosis Date Noted   Left hip pain 12/04/2022   Dehydration 05/15/2022   Alcohol abuse 05/15/2022   Alcohol withdrawal (HCC) 01/02/2020   Hyponatremia 12/31/2019   Hypokalemia 12/31/2019   Alcoholic intoxication without complication (HCC) 12/31/2019   Tobacco abuse 12/31/2019   Vomiting 12/31/2019   Intractable hiccups 12/31/2019   Anorexia 12/31/2019   Transaminasemia    Closed fracture of right side of body of mandible with routine healing 05/10/2017   Closed fracture of right zygomatic arch (HCC) 05/10/2017    PCP: Rica Records, FNP  REFERRING PROVIDER: Rica Records, FNP  REFERRING DIAG:  Diagnosis  [phone] (ICD-10-CM) - Chronic left hip pain    THERAPY DIAG:  Diagnosis  M25.552,G89.29 (ICD-10-CM) - Chronic left hip pain    Rationale for Evaluation and Treatment: Rehabilitation  ONSET DATE: initial injury 1998 with gradual increase of pain    SUBJECTIVE STATEMENT: Pt stated he did a lot of squatting/sit to stand this weekend chasing grandchild.  Feels he is making improvements with stretches and medication.   PERTINENT HISTORY: N/A PAIN:  Are you  having pain? Yes: NPRS scale: 6/10; Pain description: aches Aggravating factors: activity Relieving factors: medication   PRECAUTIONS: None  WEIGHT BEARING RESTRICTIONS: No  FALLS:  Has patient fallen in last 6 months? No  LIVING ENVIRONMENT: Lives with: lives with their spouse Lives in: House/apartment Stairs: No Has following equipment at home: None  OCCUPATION: disabled  PLOF: Independent  PATIENT GOALS: less pain  NEXT MD VISIT: not scheduled   OBJECTIVE:   DIAGNOSTIC FINDINGS: Per MD moderate OA   MUSCLE LENGTH: Hamstrings: Right 170 deg; Left 150 deg POSTURE: rounded shoulders, forward head, decreased lumbar lordosis, and increased thoracic kyphosis   LOWER EXTREMITY ROM: wfl   LOWER EXTREMITY MMT:  MMT Right eval Left eval  Hip flexion 5 2+  Hip extension 4- 2+  Hip abduction 4 2  Hip adduction    Hip internal rotation    Hip external rotation    Knee flexion  3  Knee extension 5 3  Ankle dorsiflexion 5 3+  Ankle plantarflexion    Ankle inversion    Ankle eversion     (Blank rows = not tested)    FUNCTIONAL TESTS:  30 seconds chair stand test:  6;  ( 11 for age and sex is poor) 2 minute walk test: 492 ft with no assistive device.   Single leg stance:  Rt: 15"   , LT 10" unsteady on LT    TODAY'S TREATMENT:  DATE:  01/04/23: Squat to heel raise 2x 10 3D hip excursion 5x each direction Lunge onto 6in step 10 Step up 6" step x 15 B  Tandem stance 1x 30" on floor; 2x 30" on foam Leg press 4PL 2 x 10  Side stepping with green theraband x 3 RT  SLS Rt 30", Lt 21" max of 3 Standing 12in hip flexor stretch 2x 1' Slant board 3x 30"     12/30/22: Heel/toe raise x 15 Piriformis stretch chair, 1 minute x 2 reps B Thomas stretch 1 minute x 2 reps B  Sit to stand x 15 Step up 6" step x 10 B  Slant board stretch 1 min  x 2  Side stepping with green theraband x 2 RT  Single leg stance 3 reps each x 10" B  Leg press 4PL x 10  Thomas stretch 2 x 1 minute Nustep hills 3 ,  level 4 x 7 minutes  12/28/22 Wall arch x 10 Sit to stand x 10 Squat x 10 Step up 6" step x 10 B  Slant board stretch 1 min x 2  Side stepping with green theraband x 2 RT  Single leg stance 3 reps each x 10" B  Leg press 4PL x 10  Nustep hills 3 ,  level 4 x 7 minutes    PATIENT EDUCATION:  Education details: HEP Person educated: Patient Education method: Programmer, multimedia, Verbal cues, and Handouts Education comprehension: returned demonstration  HOME EXERCISE PROGRAM: Access Code: [email or web address] URL: [email or web address] 12/30/22 - Standing Heel Raises  - 2 x daily - 7 x weekly - 1 sets - 10 reps - 5" hold - Sit to Stand  - 2 x daily - 7 x weekly - 1 sets - 10 reps - Seated Piriformis Stretch  - 1 x daily - 7 x weekly - 1 sets - 2 reps - 60" hold - Hip Flexor Stretch at Edge of Bed  - 1 x daily - 7 x weekly - 1 sets - 2 reps - 60" hold Date: 12/14/2022 Prepared by: Virgina Organ  Exercises - Seated Long Arc Quad  - 3 x daily - 7 x weekly - 1 sets - 10 reps - 5" hold - Seated Ankle Dorsiflexion AROM  - 3 x daily - 7 x weekly - 1 sets - 10 reps - 5" hold - Supine Bridge  - 2 x daily - 7 x weekly - 1 sets - 10 reps - 5 hold  ASSESSMENT:  CLINICAL IMPRESSION:  Session focus with functional strengthening and balance training with additional lunges and tandem stance.  Pt required cueing and further education on importance of posture as well as form/mechanics with new exercises.  Improved static balance noted this session compared to last.  Pt stated pain reduced at EOS.    OBJECTIVE IMPAIRMENTS: decreased activity tolerance, decreased balance, difficulty walking, and decreased strength.   ACTIVITY LIMITATIONS: carrying, lifting, sitting, standing, and locomotion level  PARTICIPATION LIMITATIONS: cleaning, shopping,  community activity, occupation, and yard work  PERSONAL FACTORS: Behavior pattern and Time since onset of injury/illness/exacerbation are also affecting patient's functional outcome. (Pt grunting throughout treatment)  REHAB POTENTIAL: Fair    CLINICAL DECISION MAKING: Stable/uncomplicated  EVALUATION COMPLEXITY: Low   GOALS: Goals reviewed with patient? Yes  SHORT TERM GOALS: Target date: 01/01/23 PT to be I in HEP in order to decrease his pain to no greater than an 8/10 Baseline: Goal status: MET  2.  PT core and LE strength to  be increased 1/2 grade to be able to come sit to stand 10 x in 30 " for improved activity tolerance Baseline:  Goal status: MET  3.  Pt to be able to stand on Lt LE for 15 seconds for decreased risk of falling  Baseline:  Goal status: IN PROGRESS    LONG TERM GOALS: Target date: 01/22/23  PT to be I in an advanced HEP to decrease his pain to no greater than a 6/10 Baseline:  Goal status: IN PROGRESS  2.  PT core and LE strength to be increased 1 grade to be able to be able to come sit to stand from a couch without UE assist  Baseline:  Goal status: IN PROGRESS  3.  PT to be able to walk for 20 minutes to allow short shopping trips  Baseline:  Goal status: IN PROGRESS   PLAN:  Pt to begin a strengthening/ balance program for his core and Lt LE   PT FREQUENCY: 2x/week  PT DURATION: 6 weeks  PLANNED INTERVENTIONS: Therapeutic exercises, Balance training, Patient/Family education, Self Care, and Manual therapy  Plan:   vector stance; increase activity as tolerated.   Becky Sax, LPTA/CLT; [place] [place] [phone]

## 2023-01-06 ENCOUNTER — Telehealth (HOSPITAL_COMMUNITY): Payer: Self-pay | Admitting: Physical Therapy

## 2023-01-06 ENCOUNTER — Encounter (HOSPITAL_COMMUNITY): Payer: Medicaid Other | Admitting: Physical Therapy

## 2023-01-06 NOTE — Telephone Encounter (Signed)
2nd no show:  Pt called stated that he forgot that he had an appointment.  Therapist explained that this is his second no show, that he has an appointment next Tuesday but all others have been cancelled and that he would have to make one appointment at a time from now on.   Virgina Organ, PT CLT [phone]

## 2023-01-12 ENCOUNTER — Encounter (HOSPITAL_COMMUNITY): Payer: Medicaid Other

## 2023-01-12 ENCOUNTER — Telehealth (HOSPITAL_COMMUNITY): Payer: Self-pay

## 2023-01-12 NOTE — Telephone Encounter (Signed)
No show #3. Called concerning missed apt today with no answer, left message informing pt of DC to HEP per no show policy. Included contact information to answer any questions concerning.  Becky Sax, LPTA/CLT; [place] [place] [phone]

## 2023-01-14 ENCOUNTER — Encounter (HOSPITAL_COMMUNITY): Payer: Medicaid Other | Admitting: Physical Therapy

## 2023-01-19 ENCOUNTER — Encounter (HOSPITAL_COMMUNITY): Payer: Medicaid Other | Admitting: Physical Therapy

## 2023-01-21 ENCOUNTER — Encounter (HOSPITAL_COMMUNITY): Payer: Medicaid Other | Admitting: Physical Therapy

## 2023-01-25 ENCOUNTER — Encounter (HOSPITAL_COMMUNITY): Payer: Medicaid Other | Admitting: Physical Therapy

## 2023-01-27 ENCOUNTER — Encounter (HOSPITAL_COMMUNITY): Payer: Medicaid Other

## 2023-02-01 ENCOUNTER — Encounter (HOSPITAL_COMMUNITY): Payer: Medicaid Other

## 2023-03-09 ENCOUNTER — Other Ambulatory Visit: Payer: Self-pay | Admitting: Family Medicine

## 2023-03-09 DIAGNOSIS — J302 Other seasonal allergic rhinitis: Secondary | ICD-10-CM

## 2023-03-17 ENCOUNTER — Ambulatory Visit: Payer: Medicaid Other | Admitting: Family Medicine

## 2023-03-18 ENCOUNTER — Encounter: Payer: Self-pay | Admitting: Family Medicine

## 2023-04-12 ENCOUNTER — Ambulatory Visit (INDEPENDENT_AMBULATORY_CARE_PROVIDER_SITE_OTHER): Payer: Medicaid Other | Admitting: Family Medicine

## 2023-04-12 ENCOUNTER — Encounter: Payer: Self-pay | Admitting: *Deleted

## 2023-04-12 ENCOUNTER — Encounter: Payer: Self-pay | Admitting: Family Medicine

## 2023-04-12 VITALS — BP 147/92 | HR 82 | Ht 66.0 in | Wt 115.0 lb

## 2023-04-12 DIAGNOSIS — I10 Essential (primary) hypertension: Secondary | ICD-10-CM

## 2023-04-12 DIAGNOSIS — E559 Vitamin D deficiency, unspecified: Secondary | ICD-10-CM | POA: Diagnosis not present

## 2023-04-12 DIAGNOSIS — R030 Elevated blood-pressure reading, without diagnosis of hypertension: Secondary | ICD-10-CM

## 2023-04-12 DIAGNOSIS — M25552 Pain in left hip: Secondary | ICD-10-CM

## 2023-04-12 DIAGNOSIS — Z1211 Encounter for screening for malignant neoplasm of colon: Secondary | ICD-10-CM

## 2023-04-12 DIAGNOSIS — R0602 Shortness of breath: Secondary | ICD-10-CM

## 2023-04-12 MED ORDER — CYCLOBENZAPRINE HCL 10 MG PO TABS: 10.0000 mg | ORAL_TABLET | Freq: Three times a day (TID) | ORAL | 0 refills | Status: DC | PRN

## 2023-04-12 MED ORDER — VITAMIN D3 25 MCG (1000 UT) PO CAPS: 1000.0000 [IU] | ORAL_CAPSULE | Freq: Every day | ORAL | 3 refills | Status: DC

## 2023-04-12 MED ORDER — LIDOCAINE 5 % EX PTCH: 1.0000 | MEDICATED_PATCH | CUTANEOUS | 0 refills | Status: DC

## 2023-04-12 NOTE — Patient Instructions (Signed)

## 2023-04-12 NOTE — Progress Notes (Addendum)
Patient Office Visit   Subjective   Patient ID: Caleb Kim, male    DOB: 1961-06-12  Age: 62 y.o. MRN: [id number]  CC:  Chief Complaint  Patient presents with   Follow-up    Requesting home health nurse    HPI Caleb Kim presents to the clinic for elevated blood pressure reading. He  has a past medical history of Alcohol withdrawal (HCC) and Osteoarthritis.For the details of today's visit, please refer to assessment and plan.   HPI    Outpatient Encounter Medications as of 04/12/2023  Medication Sig   albuterol (VENTOLIN HFA) 108 (90 Base) MCG/ACT inhaler Inhale 2 puffs into the lungs every 6 (six) hours as needed for wheezing or shortness of breath.   Cholecalciferol (VITAMIN D3) 25 MCG (1000 UT) CAPS Take 1 capsule (1,000 Units total) by mouth daily.   diclofenac (VOLTAREN) 75 MG EC tablet Take 1 tablet (75 mg total) by mouth 2 (two) times daily.   folic acid (FOLVITE) 1 MG tablet Take 1 tablet (1 mg total) by mouth daily.   ipratropium-albuterol (DUONEB) 0.5-2.5 (3) MG/3ML SOLN USE 1 VIAL VIA NEBULIZER TWICE DAILY   lidocaine (LIDODERM) 5 % Place 1 patch onto the skin daily. Remove & Discard patch within 12 hours or as directed by MD   Multiple Vitamin (MULTIVITAMIN WITH MINERALS) TABS tablet Take 1 tablet by mouth daily.   Nebulizer System All-In-One MISC 1 Device by Does not apply route as directed.   thiamine (VITAMIN B-1) 100 MG tablet Take 1 tablet (100 mg total) by mouth daily.   [DISCONTINUED] cyclobenzaprine (FLEXERIL) 10 MG tablet Take 1 tablet (10 mg total) by mouth 3 (three) times daily as needed for muscle spasms.   [DISCONTINUED] pantoprazole (PROTONIX) 40 MG tablet Take 1 tablet (40 mg total) by mouth daily.   [DISCONTINUED] venlafaxine XR (EFFEXOR XR) 37.5 MG 24 hr capsule Take 1 capsule (37.5 mg total) by mouth daily.   cyclobenzaprine (FLEXERIL) 10 MG tablet Take 1 tablet (10 mg total) by mouth 3 (three) times daily as needed for muscle spasms.    [DISCONTINUED] levofloxacin (LEVAQUIN) 750 MG tablet Take 1 tablet (750 mg total) by mouth daily. X 7 days   No facility-administered encounter medications on file as of 04/12/2023.    Past Surgical History:  Procedure Laterality Date   EXPLORATORY LAPAROTOMY      Review of Systems  Constitutional:  Negative for chills and fever.  Eyes:  Negative for blurred vision.  Respiratory:  Negative for shortness of breath.   Cardiovascular:  Negative for chest pain.  Neurological:  Negative for dizziness and headaches.      Objective    BP (!) 147/92 (BP Location: Left Arm, Patient Position: Sitting, Cuff Size: Normal)   Pulse 82   Ht 5\' 6"  (1.676 m)   Wt 115 lb 0.6 oz (52.2 kg)   SpO2 92%   BMI 18.57 kg/m   Physical Exam Vitals reviewed.  Constitutional:      General: He is not in acute distress.    Appearance: Normal appearance. He is not ill-appearing, toxic-appearing or diaphoretic.  HENT:     Head: Normocephalic.  Eyes:     General:        Right eye: No discharge.        Left eye: No discharge.     Conjunctiva/sclera: Conjunctivae normal.  Cardiovascular:     Rate and Rhythm: Normal rate.     Pulses: Normal pulses.     Heart  sounds: Normal heart sounds.  Pulmonary:     Effort: Pulmonary effort is normal. No respiratory distress.     Breath sounds: Normal breath sounds.  Musculoskeletal:        General: Normal range of motion.     Cervical back: Normal range of motion.  Skin:    General: Skin is warm and dry.     Capillary Refill: Capillary refill takes less than 2 seconds.  Neurological:     General: No focal deficit present.     Mental Status: He is alert and oriented to person, place, and time.     Coordination: Coordination normal.     Gait: Gait normal.  Psychiatric:        Mood and Affect: Mood normal.        Behavior: Behavior normal.       Assessment & Plan:  Screening for colon cancer -     Ambulatory referral to Gastroenterology -      Cologuard  SOB (shortness of breath) -     Brain natriuretic peptide  Vitamin D deficiency  Primary hypertension -     CMP14+EGFR  Elevated blood pressure reading Assessment & Plan: Vitals:   04/12/23 0918 04/12/23 0919  BP: (!) 150/89 (!) 147/92  Advise to follow up in one week with at home blood pressure reading to evaluate trends. Possible medication management if still elevated, Labs ordered in today's visit. Continued discussion on DASH diet, low sodium diet and maintain a exercise routine for 150 minutes per week.    Left hip pain Assessment & Plan: Refilled Flexeril 10 mg Trial on Lidocaine 5% patch Discussed Non pharmacological interventions include the use of ice or heat, rest, recommend range of motion exercises, gentle stretching.    Other orders -     Cyclobenzaprine HCl; Take 1 tablet (10 mg total) by mouth 3 (three) times daily as needed for muscle spasms.  Dispense: 30 tablet; Refill: 0 -     Vitamin D3; Take 1 capsule (1,000 Units total) by mouth daily.  Dispense: 90 capsule; Refill: 3 -     Lidocaine; Place 1 patch onto the skin daily. Remove & Discard patch within 12 hours or as directed by MD  Dispense: 30 patch; Refill: 0    Return in about 1 week (around 04/19/2023), or if symptoms worsen or fail to improve, for re-check blood pressure, hypertension, PCS forms.   Cruzita Lederer Newman Nip, FNP

## 2023-04-12 NOTE — Assessment & Plan Note (Signed)
Refilled Flexeril 10 mg Trial on Lidocaine 5% patch Discussed Non pharmacological interventions include the use of ice or heat, rest, recommend range of motion exercises, gentle stretching.

## 2023-04-12 NOTE — Addendum Note (Signed)
Addended by: Rica Records on: 04/12/2023 10:23 AM   Modules accepted: Level of Service

## 2023-04-12 NOTE — Assessment & Plan Note (Addendum)
Vitals:   04/12/23 0918 04/12/23 0919  BP: (!) 150/89 (!) 147/92  Advise to follow up in one week with at home blood pressure reading to evaluate trends. Possible medication management if still elevated, Labs ordered in today's visit. Continued discussion on DASH diet, low sodium diet and maintain a exercise routine for 150 minutes per week.

## 2023-04-13 ENCOUNTER — Telehealth: Payer: Self-pay | Admitting: Family Medicine

## 2023-04-13 NOTE — Progress Notes (Signed)
Please inform patient,  Liver enzymes elevated possible due to Non-Alcoholic Fatty Liver Disease (NAFLD): Accumulation of fat in the liver not related to alcohol can elevate these enzymes. Cirrhosis: Chronic liver damage, often due to long-term alcohol abuse.  I advise: Dietary Changes: Adopting a liver-friendly diet low in saturated fats, sugars, and alcohol can help reduce liver stress. Incorporating plenty of fruits, vegetables, whole grains, and lean proteins is recommended. Avoiding Toxins: Reducing or eliminating exposure to substances that can harm the liver, such as alcohol, certain medications, and environmental toxins, is important. Follow up in 4 months recheck labs and monitor trends

## 2023-04-13 NOTE — Telephone Encounter (Signed)
PCS forms  Copied Noted Sleeved (put in provider box)  Call patient when ready to pick up.

## 2023-04-18 ENCOUNTER — Other Ambulatory Visit: Payer: Self-pay | Admitting: Family Medicine

## 2023-04-22 LAB — CMP14+EGFR
ALT: 86 IU/L — ABNORMAL HIGH (ref 0–44)
AST: 98 IU/L — ABNORMAL HIGH (ref 0–40)
Albumin: 4.5 g/dL (ref 3.8–4.9)
Alkaline Phosphatase: 113 IU/L (ref 44–121)
BUN/Creatinine Ratio: 5 — ABNORMAL LOW (ref 9–20)
BUN: 4 mg/dL — ABNORMAL LOW (ref 6–24)
Bilirubin Total: 0.4 mg/dL (ref 0.0–1.2)
CO2: 20 mmol/L (ref 20–29)
Calcium: 9.5 mg/dL (ref 8.7–10.2)
Chloride: 96 mmol/L (ref 96–106)
Creatinine, Ser: 0.76 mg/dL (ref 0.76–1.27)
Globulin, Total: 3.1 g/dL (ref 1.5–4.5)
Glucose: 83 mg/dL (ref 70–99)
Potassium: 4.6 mmol/L (ref 3.5–5.2)
Sodium: 135 mmol/L (ref 134–144)
Total Protein: 7.6 g/dL (ref 6.0–8.5)
eGFR: 104 mL/min/{1.73_m2} (ref >59)

## 2023-04-22 LAB — BRAIN NATRIURETIC PEPTIDE

## 2023-08-03 ENCOUNTER — Other Ambulatory Visit: Payer: Self-pay

## 2023-08-03 ENCOUNTER — Emergency Department (HOSPITAL_COMMUNITY)
Admission: EM | Admit: 2023-08-03 | Discharge: 2023-08-03 | Disposition: A | Discharge status: Home / Self Care | Payer: Medicaid Other | Attending: Emergency Medicine | Admitting: Emergency Medicine

## 2023-08-03 ENCOUNTER — Encounter (HOSPITAL_COMMUNITY): Payer: Self-pay | Admitting: Emergency Medicine

## 2023-08-03 ENCOUNTER — Emergency Department (HOSPITAL_COMMUNITY): Payer: Medicaid Other

## 2023-08-03 DIAGNOSIS — F1010 Alcohol abuse, uncomplicated: Secondary | ICD-10-CM

## 2023-08-03 DIAGNOSIS — M25552 Pain in left hip: Secondary | ICD-10-CM

## 2023-08-03 DIAGNOSIS — R748 Abnormal levels of other serum enzymes: Secondary | ICD-10-CM | POA: Diagnosis not present

## 2023-08-03 DIAGNOSIS — Z87891 Personal history of nicotine dependence: Secondary | ICD-10-CM | POA: Insufficient documentation

## 2023-08-03 DIAGNOSIS — R413 Other amnesia: Secondary | ICD-10-CM

## 2023-08-03 DIAGNOSIS — W19XXXA Unspecified fall, initial encounter: Secondary | ICD-10-CM | POA: Diagnosis not present

## 2023-08-03 LAB — PROTIME-INR
INR: 1 (ref 0.8–1.2)
Prothrombin Time: 13.8 s (ref 11.4–15.2)

## 2023-08-03 LAB — COMPREHENSIVE METABOLIC PANEL
ALT: 86 U/L — ABNORMAL HIGH (ref 0–44)
AST: 163 U/L — ABNORMAL HIGH (ref 15–41)
Albumin: 3.8 g/dL (ref 3.5–5.0)
Alkaline Phosphatase: 88 U/L (ref 38–126)
Anion gap: 11 (ref 5–15)
BUN: <5 mg/dL — ABNORMAL LOW (ref 6–20)
CO2: 25 mmol/L (ref 22–32)
Calcium: 9 mg/dL (ref 8.9–10.3)
Chloride: 98 mmol/L (ref 98–111)
Creatinine, Ser: 0.66 mg/dL (ref 0.61–1.24)
GFR, Estimated: >60 mL/min (ref >60)
Glucose, Bld: 80 mg/dL (ref 70–99)
Potassium: 4.1 mmol/L (ref 3.5–5.1)
Sodium: 134 mmol/L — ABNORMAL LOW (ref 135–145)
Total Bilirubin: 0.9 mg/dL (ref <1.2)
Total Protein: 7.9 g/dL (ref 6.5–8.1)

## 2023-08-03 LAB — LACTIC ACID, PLASMA: Lactic Acid, Venous: 1.6 mmol/L (ref 0.5–1.9)

## 2023-08-03 LAB — CBC WITH DIFFERENTIAL/PLATELET
Abs Immature Granulocytes: 0.01 10*3/uL (ref 0.00–0.07)
Basophils Absolute: 0.1 10*3/uL (ref 0.0–0.1)
Basophils Relative: 2 %
Eosinophils Absolute: 0 10*3/uL (ref 0.0–0.5)
Eosinophils Relative: 1 %
HCT: 43.8 % (ref 39.0–52.0)
Hemoglobin: 13.8 g/dL (ref 13.0–17.0)
Immature Granulocytes: 0 %
Lymphocytes Relative: 36 %
Lymphs Abs: 1.1 10*3/uL (ref 0.7–4.0)
MCH: 31 pg (ref 26.0–34.0)
MCHC: 31.5 g/dL (ref 30.0–36.0)
MCV: 98.4 fL (ref 80.0–100.0)
Monocytes Absolute: 0.6 10*3/uL (ref 0.1–1.0)
Monocytes Relative: 19 %
Neutro Abs: 1.3 10*3/uL — ABNORMAL LOW (ref 1.7–7.7)
Neutrophils Relative %: 42 %
Platelets: 116 10*3/uL — ABNORMAL LOW (ref 150–400)
RBC: 4.45 MIL/uL (ref 4.22–5.81)
RDW: 12.5 % (ref 11.5–15.5)
WBC: 3 10*3/uL — ABNORMAL LOW (ref 4.0–10.5)
nRBC: 0 % (ref 0.0–0.2)

## 2023-08-03 LAB — AMMONIA: Ammonia: 28 umol/L (ref 9–35)

## 2023-08-03 LAB — MAGNESIUM: Magnesium: 1.9 mg/dL (ref 1.7–2.4)

## 2023-08-03 LAB — TYPE AND SCREEN
ABO/RH(D): B POS
Antibody Screen: NEGATIVE

## 2023-08-03 LAB — LIPASE, BLOOD: Lipase: 41 U/L (ref 11–51)

## 2023-08-03 MED ORDER — THIAMINE HCL 100 MG/ML IJ SOLN
100.0000 mg | Freq: Every day | INTRAMUSCULAR | Status: DC
  Administered 2023-08-03: 100 mg via INTRAVENOUS
  Filled 2023-08-03: qty 2

## 2023-08-03 MED ORDER — THIAMINE HCL 100 MG/ML IJ SOLN: 100.0000 mg | Freq: Every day | INTRAMUSCULAR | Status: DC

## 2023-08-03 MED ORDER — CHLORDIAZEPOXIDE HCL 25 MG PO CAPS
25.0000 mg | ORAL_CAPSULE | Freq: Once | ORAL | Status: AC
  Administered 2023-08-03: 25 mg via ORAL
  Filled 2023-08-03: qty 1

## 2023-08-03 MED ORDER — CHLORDIAZEPOXIDE HCL 25 MG PO CAPS: ORAL_CAPSULE | ORAL | 0 refills | Status: DC

## 2023-08-03 MED ORDER — DEXTROSE-SODIUM CHLORIDE 5-0.45 % IV SOLN: INTRAVENOUS | Status: DC

## 2023-08-03 MED ORDER — MAGNESIUM SULFATE 2 GM/50ML IV SOLN
2.0000 g | Freq: Once | INTRAVENOUS | Status: AC
  Administered 2023-08-03: 2 g via INTRAVENOUS
  Filled 2023-08-03: qty 50

## 2023-08-03 NOTE — ED Notes (Signed)
Spouse requested RN go over information with them again to make sure she was clear on the information. RN reassessed provider notes and helped to answer some of her questions. Spouse was satisfied with information and has not further questions at this time.

## 2023-08-03 NOTE — ED Triage Notes (Signed)
C/o left hip residual from a fall 2xdays ago. Pt also had a fall a month ago. Pt endorses a cough that started a week ago. Pt states they cough up white phlegm. Denies fever, n/v/d. Pt walked to tx room. 

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
## 2023-08-03 NOTE — ED Notes (Signed)
Introduced self to patient and family. Pt is alert and oriented with no complaints or needs at this time.

## 2023-08-05 ENCOUNTER — Ambulatory Visit: Payer: Self-pay | Admitting: Family Medicine

## 2023-08-05 ENCOUNTER — Emergency Department (HOSPITAL_COMMUNITY)
Admission: EM | Admit: 2023-08-05 | Discharge: 2023-08-05 | Disposition: A | Discharge status: Home / Self Care | Payer: Medicaid Other | Attending: Emergency Medicine | Admitting: Emergency Medicine

## 2023-08-05 ENCOUNTER — Other Ambulatory Visit: Payer: Self-pay

## 2023-08-05 ENCOUNTER — Encounter (HOSPITAL_COMMUNITY): Payer: Self-pay

## 2023-08-05 ENCOUNTER — Ambulatory Visit: Payer: Self-pay | Admitting: Internal Medicine

## 2023-08-05 DIAGNOSIS — F1010 Alcohol abuse, uncomplicated: Secondary | ICD-10-CM | POA: Diagnosis present

## 2023-08-05 DIAGNOSIS — F17290 Nicotine dependence, other tobacco product, uncomplicated: Secondary | ICD-10-CM | POA: Diagnosis not present

## 2023-08-05 DIAGNOSIS — Y909 Presence of alcohol in blood, level not specified: Secondary | ICD-10-CM | POA: Diagnosis not present

## 2023-08-05 MED ORDER — ONDANSETRON HCL 4 MG PO TABS: 4.0000 mg | ORAL_TABLET | ORAL | 0 refills | Status: AC | PRN

## 2023-08-05 MED ORDER — LOPERAMIDE HCL 2 MG PO CAPS: 2.0000 mg | ORAL_CAPSULE | Freq: Four times a day (QID) | ORAL | 0 refills | Status: DC | PRN

## 2023-08-05 MED ORDER — MULTI-VITAMIN/MINERALS PO TABS: 1.0000 | ORAL_TABLET | Freq: Every day | ORAL | 0 refills | Status: AC

## 2023-08-05 NOTE — ED Provider Notes (Signed)
[place] EMERGENCY DEPARTMENT AT [facility] Provider Note  CSN: [id number] Arrival date & time: 08/05/23 1125  Chief Complaint(s) Detox  HPI Caleb Kim is a 62 y.o. male with past medical history as below, significant for chronic alcohol abuse, osteoarthritis, tobacco abuse, transaminitis who presents to the ED with complaint of alcohol detox help  He was seen on 12/10 with myriad of complaints, daily alcohol abuse, was interested in cessation so was started on Librium at that time.  Workup otherwise was reassuring including head CT and screening labs.  He was given outpatient resources in regards to alcohol detox in addition to Librium taper.  Reports here today because he is anxious and wants help with detox.  He is tolerating p.o., he is having some intermittent diarrhea.  Intermittent sweating.  No chest pain or palpitations, no abdominal pain.  Drinking coffee instead of alcohol.  No alcohol since around 4 days.  He has been compliant with Librium.  They have not sought out outpatient resources yet    Past Medical History Past Medical History:  Diagnosis Date   Alcohol withdrawal (HCC)    Osteoarthritis    Patient Active Problem List   Diagnosis Date Noted   Elevated blood pressure reading 04/12/2023   Left hip pain 12/04/2022   Dehydration 05/15/2022   Alcohol abuse 05/15/2022   Alcohol withdrawal (HCC) 01/02/2020   Hyponatremia 12/31/2019   Hypokalemia 12/31/2019   Alcoholic intoxication without complication (HCC) 12/31/2019   Tobacco abuse 12/31/2019   Vomiting 12/31/2019   Intractable hiccups 12/31/2019   Anorexia 12/31/2019   Transaminasemia    Closed fracture of right side of body of mandible with routine healing 05/10/2017   Closed fracture of right zygomatic arch (HCC) 05/10/2017   Home Medication(s) Prior to Admission medications   Medication Sig Start Date End Date Taking? Authorizing Provider  albuterol (VENTOLIN HFA) 108 (90 Base)  MCG/ACT inhaler Inhale 2 puffs into the lungs every 6 (six) hours as needed for wheezing or shortness of breath. 12/04/22  Yes Del Newman Nip, Tenna Child, FNP  chlordiazePOXIDE (LIBRIUM) 25 MG capsule 50mg  PO TID x 2D, then 25-50mg  PO BID X 2D, then 25-50mg  PO QD X 1D 08/03/23  Yes Mesner, Barbara Cower, MD  cyclobenzaprine (FLEXERIL) 10 MG tablet Take 1 tablet (10 mg total) by mouth 3 (three) times daily as needed for muscle spasms. 04/12/23  Yes Del Newman Nip, Tenna Child, FNP  loperamide (IMODIUM) 2 MG capsule Take 1 capsule (2 mg total) by mouth 4 (four) times daily as needed for diarrhea or loose stools. 08/05/23  Yes Sloan Leiter, DO  Multiple Vitamins-Minerals (MULTIVITAMIN WITH MINERALS) tablet Take 1 tablet by mouth daily. 08/05/23 09/04/23 Yes Tanda Rockers A, DO  ondansetron (ZOFRAN) 4 MG tablet Take 1 tablet (4 mg total) by mouth every 4 (four) hours as needed for up to 6 days for nausea or vomiting. 08/05/23 08/11/23 Yes Sloan Leiter, DO  Nebulizer System All-In-One MISC 1 Device by Does not apply route as directed. 05/22/22   Cleora Fleet, MD  Past Surgical History Past Surgical History:  Procedure Laterality Date   EXPLORATORY LAPAROTOMY     Family History History reviewed. No pertinent family history.  Social History Social History   Tobacco Use   Smoking status: Every Day    Types: Cigars   Smokeless tobacco: Never  Vaping Use   Vaping status: Never Used  Substance Use Topics   Alcohol use: Yes    Comment: drinks "40's" all day   Drug use: No   Allergies Patient has no known allergies.  Review of Systems Review of Systems  Constitutional:  Positive for diaphoresis. Negative for chills and fever.  HENT:  Negative for congestion.   Respiratory:  Negative for chest tightness and shortness of breath.   Cardiovascular:  Negative for chest pain  and palpitations.  Gastrointestinal:  Positive for diarrhea. Negative for abdominal pain.  Genitourinary:  Negative for difficulty urinating.  Musculoskeletal:  Negative for arthralgias.  Neurological:  Negative for seizures, syncope and weakness.  Psychiatric/Behavioral:  Negative for agitation.   All other systems reviewed and are negative.   Physical Exam Vital Signs  I have reviewed the triage vital signs BP (!) 123/90   Pulse 70   Temp 98.2 F (36.8 C) (Oral)   Resp 16   Ht 5\' 6"  (1.676 m)   Wt 54.4 kg   SpO2 96%   BMI 19.37 kg/m  Physical Exam Vitals and nursing note reviewed.  Constitutional:      General: He is not in acute distress.    Appearance: Normal appearance. He is well-developed. He is not ill-appearing.  HENT:     Head: Normocephalic and atraumatic.     Right Ear: External ear normal.     Left Ear: External ear normal.     Nose: Nose normal.     Mouth/Throat:     Mouth: Mucous membranes are moist.  Eyes:     General: No scleral icterus.       Right eye: No discharge.        Left eye: No discharge.  Cardiovascular:     Rate and Rhythm: Normal rate.     Pulses: Normal pulses.  Pulmonary:     Effort: Pulmonary effort is normal. No respiratory distress.     Breath sounds: No stridor.  Abdominal:     General: Abdomen is flat. There is no distension.     Palpations: Abdomen is soft.     Tenderness: There is no abdominal tenderness. There is no guarding.  Musculoskeletal:        General: No deformity.     Cervical back: No rigidity.  Skin:    General: Skin is warm and dry.     Coloration: Skin is not cyanotic, jaundiced or pale.  Neurological:     Mental Status: He is alert and oriented to person, place, and time.     GCS: GCS eye subscore is 4. GCS verbal subscore is 5. GCS motor subscore is 6.  Psychiatric:        Speech: Speech normal.        Behavior: Behavior normal. Behavior is cooperative.     ED Results and Treatments Labs (all labs  ordered are listed, but only abnormal results are displayed) Labs Reviewed - No data to display  Radiology No results found.  Pertinent labs & imaging results that were available during my care of the patient were reviewed by me and considered in my medical decision making (see MDM for details).  Medications Ordered in ED Medications - No data to display                                                                                                                                   Procedures Procedures  (including critical care time)  Medical Decision Making / ED Course    Medical Decision Making:    Rachad Cannella is a 61 y.o. male with past medical history as below, significant for chronic alcohol abuse, osteoarthritis, tobacco abuse, transaminitis who presents to the ED with complaint of alcohol detox help. The complaint involves an extensive differential diagnosis and also carries with it a high risk of complications and morbidity.  Serious etiology was considered. Ddx includes but is not limited to: etoh withdrawal, chronic etoh abuse, etc  Complete initial physical exam performed, notably the patient was in nad, sitting upright.    Reviewed and confirmed nursing documentation for past medical history, family history, social history.  Vital signs reviewed.        Brief summary: 62 year old male history of chronic alcohol abuse here with concern for alcohol withdrawal.  He was seen recently and started on Librium.  Has not had any alcohol in approximately 4 days.  He does not appear to be acute alcohol drawl this time.  Fall signs are stable, exam stable.  He is tolerant p.o. without difficulty.  Strongly encourage patient to follow-up with PCP or with outpatient alcohol withdrawal resources.  Continue Librium.  Increase fluid intake, eat small meals throughout the  day.  Per family his symptoms have somewhat improved since recent visit.  Do not feel the need to repeat labs and imaging at this time.  Clinically appears stable, does not appear to be acute alcohol withdrawal.  Exam is reassuring.  Give further outpatient resources, medications to assist in supportive care at home.  Rehydration instructions, follow-up with PCP and outpatient facility. Return for any worsening worrisome symptoms.  The patient improved significantly and was discharged in stable condition. Detailed discussions were had with the patient regarding current findings, and need for close f/u with PCP or on call doctor. The patient has been instructed to return immediately if the symptoms worsen in any way for re-evaluation. Patient verbalized understanding and is in agreement with current care plan. All questions answered prior to discharge.                  Additional history obtained: -Additional history obtained from spouse -External records from outside source obtained and reviewed including: Chart review including previous notes, labs, imaging, consultation notes including  Recent ed visits Prior labs./imaging/home meds   Lab Tests: na  EKG   EKG Interpretation Date/Time:    Ventricular Rate:  PR Interval:    QRS Duration:    QT Interval:    QTC Calculation:   R Axis:      Text Interpretation:           Imaging Studies ordered: na   Medicines ordered and prescription drug management: Meds ordered this encounter  Medications   loperamide (IMODIUM) 2 MG capsule    Sig: Take 1 capsule (2 mg total) by mouth 4 (four) times daily as needed for diarrhea or loose stools.    Dispense:  12 capsule    Refill:  0   ondansetron (ZOFRAN) 4 MG tablet    Sig: Take 1 tablet (4 mg total) by mouth every 4 (four) hours as needed for up to 6 days for nausea or vomiting.    Dispense:  6 tablet    Refill:  0   Multiple Vitamins-Minerals (MULTIVITAMIN WITH  MINERALS) tablet    Sig: Take 1 tablet by mouth daily.    Dispense:  30 tablet    Refill:  0    -I have reviewed the patients home medicines and have made adjustments as needed   Consultations Obtained: na   Cardiac Monitoring: Continuous pulse oximetry interpreted by myself, 98% on RA.    Social Determinants of Health:  Diagnosis or treatment significantly limited by social determinants of health: current smoker and alcohol use   Reevaluation: After the interventions noted above, I reevaluated the patient and found that they have stayed the same  Co morbidities that complicate the patient evaluation  Past Medical History:  Diagnosis Date   Alcohol withdrawal (HCC)    Osteoarthritis       Dispostion: Disposition decision including need for hospitalization was considered, and patient discharged from emergency department.    Final Clinical Impression(s) / ED Diagnoses Final diagnoses:  Alcohol abuse        Sloan Leiter, DO 08/05/23 1616

## 2023-08-05 NOTE — ED Triage Notes (Signed)
Pt BIB ems for alcohol detox has not had alcohol in over a week. Pt states a lot of anxiety and wanting help with detox.

## 2023-08-05 NOTE — ED Notes (Addendum)
Tried to get a urine sample, but after trying for several minutes, patient was unable to urinate.

## 2023-08-05 NOTE — Discharge Instructions (Addendum)
Please follow-up with provided outpatient alcohol abuse resources.  If you have vomiting, seizures, any worsening worrisome symptoms please return to the ER for evaluation.  Please drink plenty of liquids over the next few days.  Eat a bland diet.  Follow-up with PCP    RESOURCE GUIDE  Chronic Pain Problems: Contact [facility]  [phone] Patients need to be referred by their primary care doctor.  Insufficient Money for Medicine: Contact [employer]:  call [phone]  No Primary Care Doctor: Call [facility]  [phone] - can help you locate a primary care doctor that  accepts your insurance, provides certain services, etc. Physician Referral Service- [phone]  Agencies that provide inexpensive medical care: [facility]  [phone] [facility]  [phone] [facility]  [phone] [facility]  [phone] [facility]  [phone] [facility]  [phone]  Medicaid-accepting [facility] Providers: [facility]- [address]  [phone], Mon-Fri 9am-7pm, Sat 9am-1pm [facility]- [address] [place], [address] [place]  [phone] [facility]- [address] [place]  [phone] [facility]- [address]  [phone] Renaye Rakers- [address] [place], [address], [phone]  Only accepts [place] patients after they have their name  applied to their card  Self Pay (no insurance) in [facility]: Sickle Cell Patients - [facility]  [address] [place], [phone] [facility]gent Care- [address] [place]  [phone]       [facility]- [address]       -     [facility]- see information above (Speak to [employer] if you do not have insurance)       -  [facility]- [address],  [phone]       -  [facility]-  [address], [phone]       -  Dr Julio Sicks-  [address], [place], [phone]       -  [facility] - [address] [phone]       -  [facility]- [address] [phone], also [address] [phone]       -     [facility]- [address] [place], [phone], 1st & 3rd Saturday         every month, 10am-1pm  -     [facility] and [facility]   [address] [place], [place].   Phone:  [phone], Fax:  [phone]. Hours of Operation:  9 am - 6 pm, M-F.  -     [facility]   [address], [place]   Phone: [phone], Fax: [phone]. Hours of Operation:  8:30 am - 5:30 pm, M-F.    Dental Assistance If unable to pay or uninsured, contact:  [facility]. to become qualified for the adult dental clinic.  Patients with Medicaid: [facility] [phone] [address], [phone] [address] [phone]  If unable to pay, or uninsured, contact [facility] [phone] in [place], [phone] in [facility]) to become qualified for the adult dental clinic  [facility] [address] [place], [place] [address] [phone] [email or web address]  Other IT consultant: [facility]- [address] [place], [place], [place], [address], [phone], [address]. [phone], 2nd and 4th Thursday of the month at 6:30am.  10 clients each day by appointment, can sometimes see walk-in patients if someone does not show for an appointment. [facility]- [address] [place], [place], [address], [phone] [facility] [address] [place], [place], [address], [phone] [facility] Health Department- [phone] [facility] Health Department- [phone] [facility] [phone]       It was a pleasure caring for you today in the emergency  department.  Please return to the emergency department for any worsening or worrisome symptoms.
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
## 2023-08-07 NOTE — Patient Instructions (Signed)

## 2023-08-07 NOTE — Telephone Encounter (Signed)
thanks

## 2023-08-07 NOTE — Progress Notes (Unsigned)
   Established Patient Office Visit   Subjective  Patient ID: Caleb Kim, male    DOB: 07/25/1961  Age: 62 y.o. MRN: [id number]  No chief complaint on file.   He  has a past medical history of Alcohol withdrawal (HCC) and Osteoarthritis.  HPI  ROS    Objective:     There were no vitals taken for this visit. {Vitals History (Optional):23777}  Physical Exam   No results found for any visits on 08/09/23.  The 10-year ASCVD risk score (Arnett DK, et al., 2019) is: 11%    Assessment & Plan:  There are no diagnoses linked to this encounter.  No follow-ups on file.   Cruzita Lederer Newman Nip, FNP

## 2023-08-08 LAB — VITAMIN B1: Vitamin B1 (Thiamine): 58.1 nmol/L — ABNORMAL LOW (ref 66.5–200.0)

## 2023-08-09 ENCOUNTER — Encounter (INDEPENDENT_AMBULATORY_CARE_PROVIDER_SITE_OTHER): Payer: Medicaid Other | Admitting: Family Medicine

## 2023-08-09 NOTE — Progress Notes (Signed)
NO SHOW
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
## 2023-08-12 NOTE — Patient Instructions (Signed)

## 2023-08-13 ENCOUNTER — Ambulatory Visit (INDEPENDENT_AMBULATORY_CARE_PROVIDER_SITE_OTHER): Payer: Medicaid Other | Admitting: Family Medicine

## 2023-08-13 ENCOUNTER — Encounter: Payer: Self-pay | Admitting: Family Medicine

## 2023-08-13 VITALS — BP 136/83 | HR 76 | Ht 66.0 in | Wt 125.0 lb

## 2023-08-13 DIAGNOSIS — N400 Enlarged prostate without lower urinary tract symptoms: Secondary | ICD-10-CM

## 2023-08-13 DIAGNOSIS — E538 Deficiency of other specified B group vitamins: Secondary | ICD-10-CM | POA: Diagnosis not present

## 2023-08-13 DIAGNOSIS — R32 Unspecified urinary incontinence: Secondary | ICD-10-CM | POA: Diagnosis not present

## 2023-08-13 DIAGNOSIS — D509 Iron deficiency anemia, unspecified: Secondary | ICD-10-CM

## 2023-08-13 DIAGNOSIS — G894 Chronic pain syndrome: Secondary | ICD-10-CM

## 2023-08-13 DIAGNOSIS — M150 Primary generalized (osteo)arthritis: Secondary | ICD-10-CM

## 2023-08-13 DIAGNOSIS — F1010 Alcohol abuse, uncomplicated: Secondary | ICD-10-CM

## 2023-08-13 DIAGNOSIS — R748 Abnormal levels of other serum enzymes: Secondary | ICD-10-CM

## 2023-08-13 DIAGNOSIS — R053 Chronic cough: Secondary | ICD-10-CM

## 2023-08-13 DIAGNOSIS — M1990 Unspecified osteoarthritis, unspecified site: Secondary | ICD-10-CM | POA: Insufficient documentation

## 2023-08-13 HISTORY — DX: Chronic cough: R05.3

## 2023-08-13 HISTORY — DX: Unspecified urinary incontinence: R32

## 2023-08-13 MED ORDER — MELOXICAM 7.5 MG PO TABS: 7.5000 mg | ORAL_TABLET | Freq: Every day | ORAL | 0 refills | Status: DC

## 2023-08-13 MED ORDER — MONTELUKAST SODIUM 10 MG PO TABS: 10.0000 mg | ORAL_TABLET | Freq: Every day | ORAL | 3 refills | Status: DC

## 2023-08-13 MED ORDER — OXYBUTYNIN CHLORIDE ER 10 MG PO TB24: 10.0000 mg | ORAL_TABLET | Freq: Every day | ORAL | 3 refills | Status: DC

## 2023-08-13 MED ORDER — BENZONATATE 200 MG PO CAPS: 200.0000 mg | ORAL_CAPSULE | Freq: Two times a day (BID) | ORAL | 0 refills | Status: DC | PRN

## 2023-08-13 NOTE — Assessment & Plan Note (Addendum)
Mild L5-S1 osteoarthritis Mobic 7.5 mg PRN Advise to engage in low-impact exercises like swimming or walking to improve flexibility and strength. Practice good posture and use ergonomic chairs or cushions to support the lower spine during sitting. Apply heat or cold therapy to manage pain and stiffness, and consider over-the-counter anti-inflammatory medications as needed. Patient wanting a referral to pain management and Orthopedics

## 2023-08-13 NOTE — Assessment & Plan Note (Addendum)
Montelukast 10 mg at bedtime Benzonatate 200 mg PRN Advise  to stay hydrated, use a humidifier, and try warm tea with honey to soothe the throat and thin mucus. Avoid irritants like smoke and cold air, and consider steam inhalation or over-the-counter remedies for temporary relief. If the cough persists beyond 8 weeks or is accompanied by concerning symptoms follow up.

## 2023-08-13 NOTE — Assessment & Plan Note (Signed)
Elevated Liver eneyzmes CMP14 , GGT labs ordered  The patient stated wanting to quit drinking Will assess patient liver enzymes  before starting naltrexone 50 mg daily to help with cravings for alcohol use Resources provided for AA classes Advise avoid purchasing alcohol, and remove alcohol from home

## 2023-08-13 NOTE — Assessment & Plan Note (Addendum)
Trial on  oxybutynin 10 mg at bedtime , Urine culture , Urinalysis ordered. Discussed non-pharmacological interventions try scheduling regular bathroom trips (every 2-4 hours) to prevent sudden urges. Avoid bladder irritants like caffeine, alcohol, and spicy foods, as these can increase urgency. Maintain a healthy weight, as excess weight can put pressure on the bladder. Stay hydrated, but try to limit fluid intake in the evening to prevent nighttime urgency. If symptoms persist follow up Referral placed to Urology

## 2023-08-15 ENCOUNTER — Other Ambulatory Visit: Payer: Self-pay | Admitting: Family Medicine

## 2023-08-15 DIAGNOSIS — R748 Abnormal levels of other serum enzymes: Secondary | ICD-10-CM

## 2023-08-15 NOTE — Progress Notes (Signed)
Please inform patient,   Your GGT levels are elevated. To further evaluate this, we have ordered an ultrasound of your liver&gallbadder. Additionally, we will refer you to a gastroenterologist for further assessment and management

## 2023-08-17 LAB — CMP14+EGFR
ALT: 42 [IU]/L (ref 0–44)
AST: 54 [IU]/L — ABNORMAL HIGH (ref 0–40)
Albumin: 3.8 g/dL (ref 3.8–4.9)
Alkaline Phosphatase: 105 [IU]/L (ref 44–121)
BUN/Creatinine Ratio: 5 — ABNORMAL LOW (ref 10–24)
BUN: 4 mg/dL — ABNORMAL LOW (ref 8–27)
Bilirubin Total: 0.8 mg/dL (ref 0.0–1.2)
CO2: 22 mmol/L (ref 20–29)
Calcium: 9.2 mg/dL (ref 8.6–10.2)
Chloride: 102 mmol/L (ref 96–106)
Creatinine, Ser: 0.79 mg/dL (ref 0.76–1.27)
Globulin, Total: 3.3 g/dL (ref 1.5–4.5)
Glucose: 73 mg/dL (ref 70–99)
Potassium: 3.5 mmol/L (ref 3.5–5.2)
Sodium: 143 mmol/L (ref 134–144)
Total Protein: 7.1 g/dL (ref 6.0–8.5)
eGFR: 102 mL/min/{1.73_m2} (ref >59)

## 2023-08-17 LAB — VITAMIN B12

## 2023-08-17 LAB — PSA: Prostate Specific Ag, Serum: 1.9 ng/mL (ref 0.0–4.0)

## 2023-08-17 LAB — IRON,TIBC AND FERRITIN PANEL
Ferritin: 1112 ng/mL — ABNORMAL HIGH (ref 30–400)
Iron Saturation: 24 % (ref 15–55)
Iron: 57 ug/dL (ref 38–169)
Total Iron Binding Capacity: 239 ug/dL — ABNORMAL LOW (ref 250–450)
UIBC: 182 ug/dL (ref 111–343)

## 2023-08-17 LAB — GAMMA GT: GGT: 321 [IU]/L — ABNORMAL HIGH (ref 0–65)

## 2023-08-23 ENCOUNTER — Ambulatory Visit (HOSPITAL_COMMUNITY): Payer: Medicaid Other | Attending: Family Medicine

## 2023-08-26 ENCOUNTER — Telehealth: Payer: Self-pay | Admitting: Family Medicine

## 2023-08-26 ENCOUNTER — Encounter (INDEPENDENT_AMBULATORY_CARE_PROVIDER_SITE_OTHER): Payer: Self-pay | Admitting: *Deleted

## 2023-08-26 NOTE — Telephone Encounter (Signed)
 done

## 2023-08-26 NOTE — Telephone Encounter (Signed)
 Copied from CRM [id number]. Topic: Medical Record Request - Patient Chart Correction Request >> Aug 26, 2023  9:40 AM Caleb Kim wrote: Reason for CRM:  Pt states his chart says he weighs 177 but it is incorrect. Pt states he weighs 125 and is asking for his charts to be corrected

## 2023-08-30 ENCOUNTER — Ambulatory Visit: Payer: Medicaid Other | Admitting: Orthopedic Surgery

## 2023-09-02 ENCOUNTER — Encounter (INDEPENDENT_AMBULATORY_CARE_PROVIDER_SITE_OTHER): Payer: Self-pay | Admitting: *Deleted

## 2023-09-15 NOTE — Progress Notes (Deleted)
 Name: Caleb Kim DOB: Sep 08, 1960 MRN: [id number]  History of Present Illness: Caleb Kim is a 63 y.o. male who presents today as a new patient at [facility] [place]. All available relevant medical records have been reviewed.  ***He is accompanied by ***.  Per chart review: > 08/13/2023:  - Seen by PCP for "a two-month history of urinary incontinence, primarily characterized by nighttime urine leakage occurring approximately twice per week. He reports episodes of nocturnal enuresis (bedwetting) during this time, which has been accompanied by increased urinary frequency."  - PSA was normal (1.9). - Normal renal function (creatinine 0.79, GFR 102). - He was prescribed Oxybutynin XL 10 mg nightly and advised to avoid caffeine.  Today: He reports chief complaint of ***urinary incontinence. He {Actions; denies-reports:120008} symptomatic improvement since starting Oxybutynin XL 10 mg nightly.  He {Actions; denies-reports:120008} urge incontinence. He {Actions; denies-reports:120008} stress incontinence with ***cough/***laugh/***sneeze/***heavy lifting /***exercise. He reports the ***SUI / ***UUI is predominant.  He leaks *** times per ***. Wears *** ***pads/ ***diapers per day. He reports urinary incontinence {ACTION; IS/IS [id number]} significantly bothersome.   He reports urinary ***frequency, ***nocturia, and ***urgency. Voiding ***x/day and ***x/night on average.   He {Actions; denies-reports:120008} prior attempted treatment for these symptoms ***including ***  He {Actions; denies-reports:120008} caffeine intake (*** caffeinated beverages per day on average).  He {Actions; denies-reports:120008} history of obstructive sleep apnea; {Actions; denies-reports:120008} wearing CPAP routinely.  ***He denies ever having a sleep study before. He {Actions; denies-reports:120008} fluid intake within 3 hours prior to bedtime. He {Actions; denies-reports:120008} fluid intake  during the night.  He {Actions; denies-reports:120008} caffeine intake within 8 hours prior to bedtime. He {Actions; denies-reports:120008} routinely experiencing lower extremity edema during the day. ***He {Actions; denies-reports:120008} elevating feet during the day and/or wearing compression socks when lower extremity edema is present during the day. ***He {Actions; denies-reports:120008} monitoring dietary salt and sodium intake.  He {Actions; denies-reports:120008} dysuria, gross hematuria, straining to void, or sensations of incomplete emptying.   Fall Screening: Do you usually have a device to assist in your mobility? {yes/no:20286} ***cane / ***walker / ***wheelchair  Medications: Current Outpatient Medications  Medication Sig Dispense Refill   albuterol (VENTOLIN HFA) 108 (90 Base) MCG/ACT inhaler Inhale 2 puffs into the lungs every 6 (six) hours as needed for wheezing or shortness of breath. 8 g 2   benzonatate (TESSALON) 200 MG capsule Take 1 capsule (200 mg total) by mouth 2 (two) times daily as needed for cough. 20 capsule 0   chlordiazePOXIDE (LIBRIUM) 25 MG capsule 50mg  PO TID x 2D, then 25-50mg  PO BID X 2D, then 25-50mg  PO QD X 1D (Patient not taking: Reported on 08/13/2023) 20 capsule 0   cyclobenzaprine (FLEXERIL) 10 MG tablet Take 1 tablet (10 mg total) by mouth 3 (three) times daily as needed for muscle spasms. 30 tablet 0   loperamide (IMODIUM) 2 MG capsule Take 1 capsule (2 mg total) by mouth 4 (four) times daily as needed for diarrhea or loose stools. 12 capsule 0   meloxicam (MOBIC) 7.5 MG tablet Take 1 tablet (7.5 mg total) by mouth daily. 30 tablet 0   montelukast (SINGULAIR) 10 MG tablet Take 1 tablet (10 mg total) by mouth at bedtime. 30 tablet 3   Nebulizer System All-In-One MISC 1 Device by Does not apply route as directed. 1 each PRN   oxybutynin (DITROPAN-XL) 10 MG 24 hr tablet Take 1 tablet (10 mg total) by mouth at bedtime. 30 tablet 3   No current  facility-administered medications for this visit.    Allergies: No Known Allergies  Past Medical History:  Diagnosis Date   Alcohol withdrawal (HCC)    Osteoarthritis    Past Surgical History:  Procedure Laterality Date   EXPLORATORY LAPAROTOMY     No family history on file. Social History   Socioeconomic History   Marital status: Married    Spouse name: Not on file   Number of children: Not on file   Years of education: Not on file   Highest education level: Not on file  Occupational History   Not on file  Tobacco Use   Smoking status: Every Day    Types: Cigars   Smokeless tobacco: Never  Vaping Use   Vaping status: Never Used  Substance and Sexual Activity   Alcohol use: Yes    Comment: drinks "40's" all day   Drug use: No   Sexual activity: Not Currently  Other Topics Concern   Not on file  Social History Narrative   Not on file   Social Drivers of Health   Financial Resource Strain: Not on file  Food Insecurity: Food Insecurity Present (05/15/2022)   Hunger Vital Sign    Worried About Running Out of Food in the Last Year: Never true    Ran Out of Food in the Last Year: Sometimes true  Transportation Needs: No Transportation Needs (05/15/2022)   PRAPARE - Administrator, Civil Service (Medical): No    Lack of Transportation (Non-Medical): No  Physical Activity: Not on file  Stress: Not on file  Social Connections: Unknown (09/03/2022)   Received from [facility], [facility]   Social Network    Social Network: Not on file  Intimate Partner Violence: Unknown (09/03/2022)   Received from [facility], [facility]   HITS    Physically Hurt: Not on file    Insult or Talk Down To: Not on file    Threaten Physical Harm: Not on file    Scream or Curse: Not on file    SUBJECTIVE  Review of Systems Constitutional: Patient denies any unintentional weight loss or change in strength lntegumentary: Patient denies any rashes or  pruritus Cardiovascular: Patient denies chest pain or syncope Respiratory: Patient denies shortness of breath Gastrointestinal: Patient ***denies nausea, vomiting, constipation, or diarrhea Musculoskeletal: Patient denies muscle cramps or weakness Neurologic: Patient denies convulsions or seizures Allergic/Immunologic: Patient denies recent allergic reaction(s) Hematologic/Lymphatic: Patient denies bleeding tendencies Endocrine: Patient denies heat/cold intolerance  GU: As per HPI.  OBJECTIVE There were no vitals filed for this visit. There is no height or weight on file to calculate BMI.  Physical Examination Constitutional: No obvious distress; patient is non-toxic appearing  Cardiovascular: No visible lower extremity edema.  Respiratory: The patient does not have audible wheezing/stridor; respirations do not appear labored  Gastrointestinal: Abdomen non-distended Musculoskeletal: Normal ROM of UEs  Skin: No obvious rashes/open sores  Neurologic: CN 2-12 grossly intact Psychiatric: Answered questions appropriately with normal affect  Hematologic/Lymphatic/Immunologic: No obvious bruises or sites of spontaneous bleeding  UA: ***negative / *** WBC/hpf, *** RBC/hpf, *** bacteria ***Urine microscopy: ***negative / *** WBC/hpf, *** RBC/hpf, *** bacteria ***with no evidence of UTI ***with no evidence of microscopic hematuria ***otherwise unremarkable  PVR: *** ml  ASSESSMENT No diagnosis found.  We discussed the different forms of urinary incontinence, such as stress and urge incontinence, and described how symptoms are consistent with mixed urinary incontinence.  1. For treatment of stress urinary incontinence: The etiology of  this condition was explained in detail to include pelvic floor muscle relaxation and detachment of the urethra away from its connection to the pubic bone. Her risk profile was reviewed, including childbirth.  ***A visual demonstration of the  pathophysiology of stress urinary incontinence was reviewed.  The management options were reviewed to include: No intervention, observation. Non-surgical options: Pelvic floor muscle rehabilitation Weight loss Incontinence pessary Surgical consultation  ***Options may include a midurethral sling (with synthetic mesh implant or autologous fascial sling), a Burch urethropexy, or transurethral injection of a bulking agent. Detailed discussion was had regarding the potential risks of both procedures, including: mesh exposure, mesh erosion, failure to resolve SUI, ineffectiveness of these procedures for urge UI, dyspareunia, voiding dysfunction and/or retention.  She elected to proceed with ***.  *** Consider possibility of vaginal voiding if pt has recessed urethra and only leaks around 1 tsp at a time. Some urine can collect in vagina and leak upon standing after voiding. If this is suspected, advise them to lean forward while voiding and wipe inside vagina after voiding for 3 months. If symptoms persist, then move on to UDT for further evaluation.  2. For treatment of OAB with urinary frequency, nocturia, urgency, and urge incontinence: We discussed the symptoms of overactive bladder (OAB), which include urinary urgency, frequency, nocturia, with or without urge incontinence.   While we may not know the exact etiology of OAB, several risk factors can be identified.  - We discussed this patient's neurogenic risk factors for OAB-type symptoms including ***T2DM ***with neuropathy, ***nicotine use, ***spinal stenosis, ***prior stroke, ***dementia.  - Likely exacerbated by ***diuretic use, ***caffeine intake, ***glucosuria (due to ***Jardiance / ***Farxiga use).   We discussed the following management options in detail including potential benefits, risks, and side effects: Behavioral therapy: Modify fluid intake Decreasing bladder irritants (such as caffeine) Urge suppression strategies Bladder  retraining / timed voiding Double voiding Medication(s): ***- For anticholinergic medications, we discussed the potential side effects of anticholinergics including dry eyes, dry mouth, constipation, cognitive impairment and urinary retention.  ***- Not a safe candidate for anticholinergic medications due to risk for side effects based on patient's age, comorbidities, and pre-existing dry mouth / dry eyes.  - For beta-3 agonist medication, we discussed the risk for urinary retention and the potential side effect of elevated blood pressure specific to Myrbetriq (which is more likely to occur in individuals with uncontrolled hypertension).  ***- Combo therapy with anticholinergic medication + beta-3 agonist medication 3. For refractory cases: PTNS (posterior tibial nerve stimulation) Sacral neuromodulation trial (Medtronic lnterStim or Axonics implant) Bladder Botox injections  ***In order to further evaluate urinary incontinence, He was instructed to complete a 3-day bladder diary.  ***Discussed recommendation for urodynamic testing, which was described in detail including risks such as bleeding, infection, organ/tissue/nerve damage.  He decided to proceed with *** ***work on behavioral modifications including ***minimizing caffeine intake and working on ***timed voiding / bladder retraining.   We agreed to plan for follow up in *** weeks / *** months or sooner if needed. Patient verbalized understanding of and agreement with current plan. All questions were answered.  PLAN Advised the following: *** ***. Minimize caffeine intake. ***. Work on timed voiding. ***. Double/ triple voiding. ***. No follow-ups on file.  No orders of the defined types were placed in this encounter.   It has been explained that the patient is to follow regularly with their PCP in addition to all other providers involved in their care and to follow  instructions provided by these respective offices. Patient  advised to contact urology clinic if any urologic-pertaining questions, concerns, new symptoms or problems arise in the interim period.  There are no Patient Instructions on file for this visit.  Electronically signed by:  Donnita Falls, MSN, FNP-C, CUNP 09/15/2023 8:48 AM

## 2023-09-23 ENCOUNTER — Ambulatory Visit: Payer: Medicaid Other | Admitting: Urology

## 2023-09-23 DIAGNOSIS — R32 Unspecified urinary incontinence: Secondary | ICD-10-CM

## 2023-09-28 ENCOUNTER — Ambulatory Visit (INDEPENDENT_AMBULATORY_CARE_PROVIDER_SITE_OTHER): Payer: MEDICAID | Admitting: Gastroenterology

## 2023-09-28 ENCOUNTER — Encounter (INDEPENDENT_AMBULATORY_CARE_PROVIDER_SITE_OTHER): Payer: Self-pay | Admitting: Gastroenterology

## 2023-09-28 VITALS — BP 121/82 | HR 91 | Temp 97.8°F | Ht 66.0 in | Wt 114.8 lb

## 2023-09-28 DIAGNOSIS — F1010 Alcohol abuse, uncomplicated: Secondary | ICD-10-CM

## 2023-09-28 DIAGNOSIS — R748 Abnormal levels of other serum enzymes: Secondary | ICD-10-CM | POA: Insufficient documentation

## 2023-09-28 DIAGNOSIS — Z1211 Encounter for screening for malignant neoplasm of colon: Secondary | ICD-10-CM | POA: Insufficient documentation

## 2023-09-28 DIAGNOSIS — D649 Anemia, unspecified: Secondary | ICD-10-CM | POA: Insufficient documentation

## 2023-09-28 DIAGNOSIS — K7400 Hepatic fibrosis, unspecified: Secondary | ICD-10-CM

## 2023-09-28 HISTORY — DX: Hepatic fibrosis, unspecified: K74.00

## 2023-09-28 HISTORY — DX: Abnormal levels of other serum enzymes: R74.8

## 2023-09-28 NOTE — Patient Instructions (Signed)
It was very nice to meet you today, as dicussed with will plan for the following :  1) Labwork and ultrasound  2) Upper endoscopy and colonoscopy

## 2023-09-28 NOTE — Progress Notes (Signed)
 Devarious Pavek Faizan Jessee Mezera , M.D. Gastroenterology & Hepatology [facility] [address] [place], [place] [address] Primary Care Physician: Terry Wilhelmena Lloyd Hilario, [place] [address] [place] [address]  Chief Complaint:  elevated liver enzymes, anemia and colon cancer screening   History of Present Illness:  Caleb Kim is a 63 y.o. male with alcohol use disorder who presents for evaluation of elevated liver enzymes, anemia and colon cancer screening .  Patient reports that he has never been told by has any liver issues.  Denies any herbal medications or any history of hepatitis.  Although patient reports due to social circumstances history of drinking beer heavily daily but has significantly cut down but still drinks beer dailyThe patient denies having any nausea, vomiting, fever, chills, hematochezia, melena, hematemesis, abdominal distention, abdominal pain, diarrhea, jaundice, pruritus or weight loss.  Last ZHI:wnwz Last Colonoscopy:non  FHx: neg for any gastrointestinal/liver disease, no malignancies Social: Daily Beer use  Surgical: Ex-lap   Lab work with hemoglobin 10 in 2023 with most recent hemoglobin 13.8 Elevated GGT 321 and chronic liver enzyme elevation with AST more than ALT most recent AST 54 ALT 42  Platelet 116  Last ultrasound 2021 with hepatic steatosis  Past Medical History: Past Medical History:  Diagnosis Date   Alcohol withdrawal (HCC)    Osteoarthritis     Past Surgical History: Past Surgical History:  Procedure Laterality Date   EXPLORATORY LAPAROTOMY      Family History:History reviewed. No pertinent family history.  Social History: Social History   Tobacco Use  Smoking Status Every Day   Types: Cigars  Smokeless Tobacco Never   Social History   Substance and Sexual Activity  Alcohol Use Yes   Comment: drinks beer daily   Social History   Substance and Sexual Activity  Drug Use  No    Allergies: No Known Allergies  Medications: Current Outpatient Medications  Medication Sig Dispense Refill   albuterol  (VENTOLIN  HFA) 108 (90 Base) MCG/ACT inhaler Inhale 2 puffs into the lungs every 6 (six) hours as needed for wheezing or shortness of breath. 8 g 2   cyclobenzaprine  (FLEXERIL ) 10 MG tablet Take 1 tablet (10 mg total) by mouth 3 (three) times daily as needed for muscle spasms. 30 tablet 0   meloxicam  (MOBIC ) 7.5 MG tablet Take 1 tablet (7.5 mg total) by mouth daily. 30 tablet 0   Nebulizer System All-In-One MISC 1 Device by Does not apply route as directed. 1 each PRN   chlordiazePOXIDE  (LIBRIUM ) 25 MG capsule 50mg  PO TID x 2D, then 25-50mg  PO BID X 2D, then 25-50mg  PO QD X 1D (Patient not taking: Reported on 08/13/2023) 20 capsule 0   No current facility-administered medications for this visit.    Review of Systems: GENERAL: negative for malaise, night sweats HEENT: No changes in hearing or vision, no nose bleeds or other nasal problems. NECK: Negative for lumps, goiter, pain and significant neck swelling RESPIRATORY: Negative for cough, wheezing CARDIOVASCULAR: Negative for chest pain, leg swelling, palpitations, orthopnea GI: SEE HPI MUSCULOSKELETAL: Negative for joint pain or swelling, back pain, and muscle pain. SKIN: Negative for lesions, rash HEMATOLOGY Negative for prolonged bleeding, bruising easily, and swollen nodes. ENDOCRINE: Negative for cold or heat intolerance, polyuria, polydipsia and goiter. NEURO: negative for tremor, gait imbalance, syncope and seizures. The remainder of the review of systems is noncontributory.   Physical Exam: BP 121/82 (BP Location: Right Arm, Patient Position: Sitting, Cuff Size: Normal)  Pulse 91   Temp 97.8 F (36.6 C) (Temporal)   Ht 5' 6 (1.676 m)   Wt 114 lb 12.8 oz (52.1 kg)   BMI 18.53 kg/m  GENERAL: The patient is AO x3, in no acute distress. HEENT: Head is normocephalic and atraumatic. EOMI are  intact. Mouth is well hydrated and without lesions. NECK: Supple. No masses LUNGS: Clear to auscultation. No presence of rhonchi/wheezing/rales. Adequate chest expansion HEART: RRR, normal s1 and s2. ABDOMEN: Soft, nontender, no guarding, no peritoneal signs, and nondistended. BS +. No masses.  Imaging/Labs: as above     Latest Ref Rng & Units 08/03/2023    6:06 AM 12/08/2022   10:03 AM 05/24/2022    3:34 AM  CBC  WBC 4.0 - 10.5 K/uL 3.0  4.2  7.1   Hemoglobin 13.0 - 17.0 g/dL 86.1  86.6  89.9   Hematocrit 39.0 - 52.0 % 43.8  39.1  29.3   Platelets 150 - 400 K/uL 116  233  347    Lab Results  Component Value Date   IRON 57 08/13/2023   TIBC 239 (L) 08/13/2023   FERRITIN 1,112 (H) 08/13/2023    I personally reviewed and interpreted the available labs, imaging and endoscopic files.     Impression and Plan:  Caleb Kim is a 63 y.o. male with alcohol use disorder who presents for evaluation of elevated liver enzymes, anemia and colon cancer screening .  #Elevated liver enzymes   Fibrosis 4 Score = 4.31 (High risk)        Interpretation for patients with NAFLD          <1.30       -  F0-F1 (Low risk)          1.30-2.67 -  Indeterminate           >2.67      -  F3-F4 (High risk)      Validated for ages 70-65  Patient had hepatocellular pattern elevation liver enzymes chronically.  This is likely because of alcohol use disorder with AST more than ALT.  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  Will send baseline viral hepatitis profile and autoimmune serologies  Given high FIB4 will obtain elastography with complete abdominal ultrasound  Extensive all cessation counseling was given and patient seems  motivated  #Anemia #Screening for EV   Patient 2023 had normocytic anemia which has resolved Given platelet less than 150 and concerning for advanced fibrosis we will obtain upper endoscopy to screen for esophageal varices and also underlying anemia  #Colon cancer screening   The patient was counseled regarding the importance of colorectal cancer screening, particularly starting at age 50 due to the rising incidence of colorectal cancer in younger individuals. The benefits of screening include early detection of colorectal cancer and precancerous polyps, which can improve treatment outcomes and reduce mortality. Risks associated with screening, particularly colonoscopy, include potential complications such as bleeding and perforation. After deciding different modalities for screening for colon cancer , patient has opted to pursue Colonoscopy   All questions were answered.      Caleb Manson Faizan Gentle Hoge, MD Gastroenterology and Hepatology [facility]   This chart has been completed using [facility] Dictation software, and while  attempts have been made to ensure accuracy , certain words and phrases may not be transcribed as intended

## 2023-09-29 ENCOUNTER — Telehealth (INDEPENDENT_AMBULATORY_CARE_PROVIDER_SITE_OTHER): Payer: Self-pay | Admitting: Gastroenterology

## 2023-09-29 NOTE — Telephone Encounter (Signed)
 Left message for pt to return call to schedule TCS/EGD Ultrasound scheduled for 10/06/23 at 9:30am. Pt to arrive at 9:15am, NPO after midnight

## 2023-10-05 MED ORDER — PEG 3350-KCL-NA BICARB-NACL 420 G PO SOLR: 4000.0000 mL | Freq: Once | ORAL | 0 refills | Status: AC

## 2023-10-05 NOTE — Telephone Encounter (Signed)
Pt contacted and made aware of ultrasound appt in the morning. TCS/EGD scheduled. Instructions up front for pt to pick up after [place]. Prep sent to pharmacy.

## 2023-10-05 NOTE — Addendum Note (Signed)
Addended by: Marlowe Shores on: 10/05/2023 02:33 PM   Modules accepted: Orders

## 2023-10-06 ENCOUNTER — Ambulatory Visit (HOSPITAL_COMMUNITY)
Admission: RE | Admit: 2023-10-06 | Discharge: 2023-10-06 | Disposition: A | Discharge status: Home / Self Care | Payer: MEDICAID | Source: Ambulatory Visit | Attending: Gastroenterology | Admitting: Gastroenterology

## 2023-10-06 DIAGNOSIS — K7400 Hepatic fibrosis, unspecified: Secondary | ICD-10-CM | POA: Insufficient documentation

## 2023-10-06 DIAGNOSIS — R748 Abnormal levels of other serum enzymes: Secondary | ICD-10-CM | POA: Insufficient documentation

## 2023-10-11 ENCOUNTER — Encounter (HOSPITAL_COMMUNITY): Payer: Self-pay | Admitting: Anesthesiology

## 2023-10-11 ENCOUNTER — Encounter (HOSPITAL_COMMUNITY): Admission: RE | Payer: Self-pay | Source: Ambulatory Visit

## 2023-10-11 ENCOUNTER — Telehealth (INDEPENDENT_AMBULATORY_CARE_PROVIDER_SITE_OTHER): Payer: Self-pay | Admitting: Gastroenterology

## 2023-10-11 ENCOUNTER — Ambulatory Visit (HOSPITAL_COMMUNITY): Admission: RE | Admit: 2023-10-11 | Payer: MEDICAID | Source: Ambulatory Visit | Admitting: Gastroenterology

## 2023-10-11 SURGERY — COLONOSCOPY WITH PROPOFOL
Anesthesia: Choice

## 2023-10-11 NOTE — Telephone Encounter (Signed)
 TCS/EGD with Dr.Ahmed ASA 1-2

## 2023-10-11 NOTE — Telephone Encounter (Signed)
 Left message to return call

## 2023-10-11 NOTE — Telephone Encounter (Signed)
 Per Sharlyne Pacas spoke with [name] he is gonna need to be rescheduled, he didn't know he was supposed to be here at 915 this am and he hadnt done a bowel prep

## 2023-10-11 NOTE — Progress Notes (Signed)
 Hi Wendy ,  Can you please call the patient and tell the patient the ultrasound  showed fatty liver but did not show any advance scarring .   You were supposed to have procedure today and I understand you did not prep forit, I still recommend it and you can reschedule   Thanks,  [name], MD Gastroenterology and Hepatology [facility] Gastroenterology  =============  IMPRESSION: 1. Increased hepatic echotexture, most commonly seen with steatosis. Correlation with LFT's is recommended.  2.  Gallbladder sludge.  No biliary dilatation.  ULTRASOUND HEPATIC ELASTOGRAPHY:  Median kPa: 5.1

## 2023-10-12 NOTE — Telephone Encounter (Signed)
 Left message to return call

## 2023-10-15 MED ORDER — PEG 3350-KCL-NA BICARB-NACL 420 G PO SOLR: 4000.0000 mL | Freq: Once | ORAL | 0 refills | Status: AC

## 2023-10-15 NOTE — Addendum Note (Signed)
 Addended by: Marlowe Shores on: 10/15/2023 10:20 AM   Modules accepted: Orders

## 2023-10-15 NOTE — Telephone Encounter (Signed)
 Spoke with pt and pt is scheduled for 11/08/23. Prep sent to pharmacy. Instructions mailed to patient.

## 2023-11-08 ENCOUNTER — Encounter (HOSPITAL_COMMUNITY): Admission: RE | Payer: Self-pay | Source: Ambulatory Visit

## 2023-11-08 ENCOUNTER — Ambulatory Visit (HOSPITAL_COMMUNITY): Admission: RE | Admit: 2023-11-08 | Payer: MEDICAID | Source: Ambulatory Visit | Admitting: Gastroenterology

## 2023-11-08 ENCOUNTER — Telehealth (INDEPENDENT_AMBULATORY_CARE_PROVIDER_SITE_OTHER): Payer: Self-pay | Admitting: Gastroenterology

## 2023-11-08 SURGERY — COLONOSCOPY WITH PROPOFOL
Anesthesia: Choice

## 2023-11-08 NOTE — Telephone Encounter (Signed)
 Page, Lisa Roca, RN  Marlowe Shores, LPN Patient states he did not have any prep to drink. He needs to be rescheduled.  Pt did not pick up prep from pharmacy. This will be the 3rd time rescheduling him. Per our office policy, if he does not show up to 3rd appt for colonoscopy pt will have to obtain service elsewhere.

## 2023-11-11 ENCOUNTER — Encounter: Payer: Self-pay | Admitting: Family Medicine

## 2023-11-11 ENCOUNTER — Ambulatory Visit (INDEPENDENT_AMBULATORY_CARE_PROVIDER_SITE_OTHER): Payer: MEDICAID | Admitting: Family Medicine

## 2023-11-11 VITALS — BP 106/71 | HR 53 | Resp 16 | Ht 66.0 in | Wt 114.4 lb

## 2023-11-11 DIAGNOSIS — M150 Primary generalized (osteo)arthritis: Secondary | ICD-10-CM | POA: Diagnosis not present

## 2023-11-11 NOTE — Patient Instructions (Addendum)
        Great to see you today.  I have refilled the medication(s) we provide.   If labs were collected, we will inform you of lab results once received either by echart message or telephone call.   - echart message- for normal results that have been seen by the patient already.   - telephone call: abnormal results or if patient has not viewed results in their echart.   - Please take medications as prescribed. - Follow up with your primary health provider if any health concerns arises. - If symptoms worsen please contact your primary care provider and/or visit the emergency department.    Follow up:  [facility]: Jerelene Redden, MD Address: [address] [place], [place], [place] [address] Phone: [phone]   Urology: [facility] [place] [address] [place],  [place]  [address] Main: [phone]

## 2023-11-11 NOTE — Progress Notes (Signed)
   Established Patient Office Visit   Subjective  Patient ID: Caleb Kim, male    DOB: 04/14/1961  Age: 63 y.o. MRN: [id number]  No chief complaint on file.   He  has a past medical history of Alcohol withdrawal (HCC) and Osteoarthritis.  HPI Patient presents to the clinic for follow up. For the details of today's visit, please refer to assessment and plan.   Review of Systems  Constitutional:  Negative for chills and fever.  Respiratory:  Negative for shortness of breath.   Cardiovascular:  Negative for chest pain.  Musculoskeletal:  Positive for joint pain and myalgias.  Neurological:  Negative for dizziness and headaches.      Objective:     BP 106/71   Pulse (!) 53   Resp 16   Ht 5\' 6"  (1.676 m)   Wt 114 lb 6.4 oz (51.9 kg)   SpO2 94%   BMI 18.46 kg/m  BP Readings from Last 3 Encounters:  11/11/23 106/71  09/28/23 121/82  08/13/23 136/83      Physical Exam Vitals reviewed.  Constitutional:      General: He is not in acute distress.    Appearance: Normal appearance. He is not ill-appearing, toxic-appearing or diaphoretic.  HENT:     Head: Normocephalic.  Eyes:     General:        Right eye: No discharge.        Left eye: No discharge.     Conjunctiva/sclera: Conjunctivae normal.  Cardiovascular:     Rate and Rhythm: Normal rate.     Pulses: Normal pulses.     Heart sounds: Normal heart sounds.  Pulmonary:     Effort: Pulmonary effort is normal. No respiratory distress.     Breath sounds: Normal breath sounds.  Abdominal:     General: Bowel sounds are normal.     Palpations: Abdomen is soft.     Tenderness: There is no abdominal tenderness. There is no right CVA tenderness, left CVA tenderness or guarding.  Musculoskeletal:     Thoracic back: Decreased range of motion.     Lumbar back: Tenderness present. Decreased range of motion. Positive left straight leg raise test. Negative right straight leg raise test.  Skin:    General: Skin is warm and  dry.     Capillary Refill: Capillary refill takes less than 2 seconds.  Neurological:     Mental Status: He is alert.     Coordination: Coordination normal.     Gait: Gait normal.  Psychiatric:        Mood and Affect: Mood normal.        Behavior: Behavior normal.      No results found for any visits on 11/11/23.  The 10-year ASCVD risk score (Arnett DK, et al., 2019) is: 8.9%    Assessment & Plan:  Primary osteoarthritis involving multiple joints Assessment & Plan: Mobic 7.5 mg prescribed as needed. Patient advised to incorporate low-impact activities such as walking or swimming to enhance joint flexibility and muscle strength. Encouraged proper posture with the use of ergonomic support (e.g., chairs or cushions) to reduce lower back strain. Recommended alternating heat and cold therapy to alleviate pain and stiffness, and to continue OTC anti-inflammatory use if needed. Advise to follow up with Orthopedics      Return in about 4 months (around 03/12/2024), or if symptoms worsen or fail to improve, for routine labs.   Cruzita Lederer Newman Nip, FNP

## 2023-11-11 NOTE — Assessment & Plan Note (Signed)
 Mobic 7.5 mg prescribed as needed. Patient advised to incorporate low-impact activities such as walking or swimming to enhance joint flexibility and muscle strength. Encouraged proper posture with the use of ergonomic support (e.g., chairs or cushions) to reduce lower back strain. Recommended alternating heat and cold therapy to alleviate pain and stiffness, and to continue OTC anti-inflammatory use if needed. Advise to follow up with Orthopedics

## 2023-11-15 NOTE — Progress Notes (Signed)
 Labs for elevated liver enzymes likely from AUD but hemochromatosis a differential   Percent saturation 43 Ferritin elevated at 1049 ALT 32 AST 49 improved from previous values INR 1.1 Negative AMA, ASMA Hep A nonimmune Hep B nonexposed and immune Hep C negative HIV negative

## 2023-11-17 LAB — COMPREHENSIVE METABOLIC PANEL
ALT: 32 IU/L (ref 0–44)
AST: 49 IU/L — ABNORMAL HIGH (ref 0–40)
Albumin: 4.4 g/dL (ref 3.8–4.9)
Alkaline Phosphatase: 83 IU/L (ref 44–121)
BUN/Creatinine Ratio: 10 (ref 10–24)
BUN: 7 mg/dL — ABNORMAL LOW (ref 8–27)
Bilirubin Total: 1 mg/dL (ref 0.0–1.2)
CO2: 22 mmol/L (ref 20–29)
Calcium: 9.6 mg/dL (ref 8.6–10.2)
Chloride: 94 mmol/L — ABNORMAL LOW (ref 96–106)
Creatinine, Ser: 0.67 mg/dL — ABNORMAL LOW (ref 0.76–1.27)
Globulin, Total: 3.4 g/dL (ref 1.5–4.5)
Glucose: 68 mg/dL — ABNORMAL LOW (ref 70–99)
Potassium: 3.6 mmol/L (ref 3.5–5.2)
Sodium: 133 mmol/L — ABNORMAL LOW (ref 134–144)
Total Protein: 7.8 g/dL (ref 6.0–8.5)
eGFR: 107 mL/min/{1.73_m2} (ref >59)

## 2023-11-17 LAB — PROTIME-INR
INR: 1.1 (ref 0.9–1.2)
Prothrombin Time: 11.8 s (ref 9.1–12.0)

## 2023-11-17 LAB — HEPATITIS B SURFACE ANTIGEN: Hepatitis B Surface Ag: NEGATIVE

## 2023-11-17 LAB — MITOCHONDRIAL ANTIBODIES: Mitochondrial Ab: <20 U (ref 0.0–20.0)

## 2023-11-17 LAB — HEPATITIS B SURFACE ANTIBODY,QUALITATIVE: Hep B Surface Ab, Qual: REACTIVE

## 2023-11-17 LAB — HEPATITIS B CORE ANTIBODY, TOTAL: Hep B Core Total Ab: NEGATIVE

## 2023-11-17 LAB — HEPATITIS C ANTIBODY: Hep C Virus Ab: NONREACTIVE

## 2023-11-17 LAB — HIV ANTIBODY (ROUTINE TESTING W REFLEX): HIV Screen 4th Generation wRfx: NONREACTIVE

## 2023-11-17 LAB — IRON,TIBC AND FERRITIN PANEL
Ferritin: 1049 ng/mL — ABNORMAL HIGH (ref 30–400)
Iron Saturation: 43 % (ref 15–55)
Iron: 114 ug/dL (ref 38–169)
Total Iron Binding Capacity: 268 ug/dL (ref 250–450)
UIBC: 154 ug/dL (ref 111–343)

## 2023-11-17 LAB — HEPATITIS A ANTIBODY, TOTAL: hep A Total Ab: NEGATIVE

## 2023-11-17 LAB — ANA: ANA Titer 1: NEGATIVE

## 2023-11-17 LAB — ANTI-SMOOTH MUSCLE ANTIBODY, IGG: Smooth Muscle Ab: 9 U (ref 0–19)

## 2023-11-22 ENCOUNTER — Encounter (INDEPENDENT_AMBULATORY_CARE_PROVIDER_SITE_OTHER): Payer: Self-pay | Admitting: Gastroenterology

## 2023-11-22 ENCOUNTER — Ambulatory Visit (INDEPENDENT_AMBULATORY_CARE_PROVIDER_SITE_OTHER): Payer: MEDICAID | Admitting: Gastroenterology

## 2023-11-22 VITALS — BP 144/95 | HR 82 | Temp 97.5°F | Ht 66.0 in | Wt 119.7 lb

## 2023-11-22 DIAGNOSIS — F1090 Alcohol use, unspecified, uncomplicated: Secondary | ICD-10-CM | POA: Diagnosis not present

## 2023-11-22 DIAGNOSIS — R7401 Elevation of levels of liver transaminase levels: Secondary | ICD-10-CM | POA: Diagnosis not present

## 2023-11-22 DIAGNOSIS — R748 Abnormal levels of other serum enzymes: Secondary | ICD-10-CM | POA: Diagnosis not present

## 2023-11-22 DIAGNOSIS — R7989 Other specified abnormal findings of blood chemistry: Secondary | ICD-10-CM

## 2023-11-22 NOTE — Progress Notes (Signed)
 Caleb Kim , M.D. Gastroenterology & Hepatology [facility] Gastroenterology [address] [place], [place] [address] Primary Care Physician: Caleb Kim, [place] [address] [place] [address]  Chief Complaint:  elevated liver enzymes, anemia and colon cancer screening   History of Present Illness:  Caleb Kim is a 63 y.o. male with alcohol use disorder who presents for follow up elevated liver enzymes, anemia and colon cancer screening .  Patient was scheduled so far 3 times for colonoscopy and had not picked up prep and was rescheduled  all 3 times Patient reports that he has never been told by has any liver issues.  Denies any herbal medications or any history of hepatitis.  Although patient reports due to social circumstances history of drinking beer heavily daily but has significantly cut down but still drinks beer daily.The patient denies having any nausea, vomiting, fever, chills, hematochezia, melena, hematemesis, abdominal distention, abdominal pain, diarrhea, jaundice, pruritus or weight loss.  Last JXB:JYNW Last Colonoscopy:none  FHx: neg for any gastrointestinal/liver disease, no malignancies Social: Daily Beer use  Surgical: Ex-lap   Lab work with hemoglobin 10 in 2023 with most recent hemoglobin 13.8 Elevated GGT 321 and chronic liver enzyme elevation with AST more than ALT most recent AST 54 ALT 42  Platelet 116  Last ultrasound 2021 with hepatic steatosis  Past Medical History: Past Medical History:  Diagnosis Date   Alcohol withdrawal (HCC)    Osteoarthritis     Past Surgical History: Past Surgical History:  Procedure Laterality Date   EXPLORATORY LAPAROTOMY      Family History:No family history on file.  Social History: Social History   Tobacco Use  Smoking Status Every Day   Types: Cigars  Smokeless Tobacco Never   Social History   Substance and Sexual Activity  Alcohol Use Yes    Comment: drinks beer daily   Social History   Substance and Sexual Activity  Drug Use No    Allergies: No Known Allergies  Medications: Current Outpatient Medications  Medication Sig Dispense Refill   albuterol (VENTOLIN HFA) 108 (90 Base) MCG/ACT inhaler Inhale 2 puffs into the lungs every 6 (six) hours as needed for wheezing or shortness of breath. 8 g 2   chlordiazePOXIDE (LIBRIUM) 25 MG capsule 50mg  PO TID x 2D, then 25-50mg  PO BID X 2D, then 25-50mg  PO QD X 1D 20 capsule 0   cyclobenzaprine (FLEXERIL) 10 MG tablet Take 1 tablet (10 mg total) by mouth 3 (three) times daily as needed for muscle spasms. 30 tablet 0   meloxicam (MOBIC) 7.5 MG tablet Take 1 tablet (7.5 mg total) by mouth daily. 30 tablet 0   Nebulizer System All-In-One MISC 1 Device by Does not apply route as directed. 1 each PRN   No current facility-administered medications for this visit.    Review of Systems: GENERAL: negative for malaise, night sweats HEENT: No changes in hearing or vision, no nose bleeds or other nasal problems. NECK: Negative for lumps, goiter, pain and significant neck swelling RESPIRATORY: Negative for cough, wheezing CARDIOVASCULAR: Negative for chest pain, leg swelling, palpitations, orthopnea GI: SEE HPI MUSCULOSKELETAL: Negative for joint pain or swelling, back pain, and muscle pain. SKIN: Negative for lesions, rash HEMATOLOGY Negative for prolonged bleeding, bruising easily, and swollen nodes. ENDOCRINE: Negative for cold or heat intolerance, polyuria, polydipsia and goiter. NEURO: negative for tremor, gait imbalance, syncope and seizures. The remainder of the review of systems is noncontributory.  Physical Exam: BP (!) 144/95   Pulse 82   Temp (!) 97.5 F (36.4 C)   Ht 5\' 6"  (1.676 m)   Wt 119 lb 11.2 oz (54.3 kg)   BMI 19.32 kg/m  GENERAL: The patient is AO x3, in no acute distress. HEENT: Head is normocephalic and atraumatic. EOMI are intact. Mouth is well hydrated  and without lesions. NECK: Supple. No masses LUNGS: Clear to auscultation. No presence of rhonchi/wheezing/rales. Adequate chest expansion HEART: RRR, normal s1 and s2. ABDOMEN: Soft, nontender, no guarding, no peritoneal signs, and nondistended. BS +. No masses.  Imaging/Labs: as above     Latest Ref Rng & Units 08/03/2023    6:06 AM 12/08/2022   10:03 AM 05/24/2022    3:34 AM  CBC  WBC 4.0 - 10.5 K/uL 3.0  4.2  7.1   Hemoglobin 13.0 - 17.0 g/dL 69.6  29.5  28.4   Hematocrit 39.0 - 52.0 % 43.8  39.1  29.3   Platelets 150 - 400 K/uL 116  233  347    Lab Results  Component Value Date   IRON 114 11/10/2023   TIBC 268 11/10/2023   FERRITIN 1,049 (H) 11/10/2023    I personally reviewed and interpreted the available labs, imaging and endoscopic files.      Labs for elevated liver enzymes likely from AUD but hemochromatosis a differential    Percent saturation 43 Ferritin elevated at 1049 ALT 32 AST 49 improved from previous values INR 1.1 Negative AMA, ASMA Hep A nonimmune Hep B nonexposed and immune Hep C negative HIV negative         IMPRESSION: 1. Increased hepatic echotexture, most commonly seen with steatosis. Correlation with LFT's is recommended.   2.  Gallbladder sludge.  No biliary dilatation.   ULTRASOUND HEPATIC ELASTOGRAPHY:   Median kPa: 5.1  Impression and Plan:  Caleb Kim is a 63 y.o. male with alcohol use disorder who presents for evaluation of elevated liver enzymes, anemia and colon cancer screening .  #Elevated liver enzymes #Elevated ferritin with Tsat   Fibrosis 4 Score = 4.48 (High risk)        Interpretation for patients with NAFLD          <1.30       -  F0-F1 (Low risk)          1.30-2.67 -  Indeterminate           >2.67      -  F3-F4 (High risk)      Validated for ages 61-65        Patient had hepatocellular pattern elevation liver enzymes chronically.  This is likely because of alcohol use disorder with AST more than  ALT.  GGT elevation is likely also because of alcohol use as ETOH inducer of GGT  Patient has significantly decreased alcohol use as evident with downtrending liver enzymes  Patient has high fib 4 score platelet less than 150 concerning for advanced fibrosis but ultrasound and elastrography suggest other wise   Viral hepatitis profile serologies negative as above  Extensive all cessation counseling was given and patient seems motivated  Elevated ferritin could be due to alcohol use but given elevated TSAT continue elevation liver enzymes will obtain hemochromatosis gene testing  Also will obtain Elita Boone FibroSure test as NILDA suggests advanced fibrosis with elastography suggests otherwise  #Anemia #Screening for EV   Patient 2023 had normocytic anemia which has resolved Given platelet less than 150 and concerning for  advanced fibrosis we will obtain upper endoscopy to screen for esophageal varices and also underlying anemia  #Colon cancer screening   The patient was counseled regarding the importance of colorectal cancer screening, particularly starting at age 44 due to the rising incidence of colorectal cancer in younger individuals. The benefits of screening include early detection of colorectal cancer and precancerous polyps, which can improve treatment outcomes and reduce mortality. Risks associated with screening, particularly colonoscopy, include potential complications such as bleeding and perforation. After deciding different modalities for screening for colon cancer , patient has opted to pursue Colonoscopy   Patient was scheduled so far 3 times for colonoscopy and had not picked up prep and was rescheduled  all 3 times.  I discussed with patient if he fails to show up or unable prepare for colonoscopy, we may not be able to pursue colonoscopy here as per office policy.  Discussed with patient about stool based testing but again discussed if its positive would indicate colonoscopy as  well  All questions were answered.      Caleb Lawman, MD Gastroenterology and Hepatology [facility] Gastroenterology   This chart has been completed using [facility] Dictation software, and while attempts have been made to ensure accuracy , certain words and phrases may not be transcribed as intended

## 2023-11-22 NOTE — Patient Instructions (Signed)
 It was very nice to meet you today, as dicussed with will plan for the following :  1) Labs  2) upper endoscopy and colonoscopy

## 2023-12-14 ENCOUNTER — Encounter (HOSPITAL_COMMUNITY): Payer: Self-pay | Admitting: Certified Registered"

## 2023-12-14 ENCOUNTER — Ambulatory Visit (HOSPITAL_COMMUNITY): Admission: RE | Admit: 2023-12-14 | Payer: MEDICAID | Source: Home / Self Care | Admitting: Gastroenterology

## 2023-12-14 ENCOUNTER — Encounter (HOSPITAL_COMMUNITY): Admission: RE | Payer: Self-pay | Source: Home / Self Care

## 2023-12-14 SURGERY — COLONOSCOPY
Anesthesia: Choice

## 2023-12-14 NOTE — OR Nursing (Signed)
 Mr. Caleb Kim did not show up for his procedure 12/14/2023 .

## 2024-02-03 ENCOUNTER — Encounter (INDEPENDENT_AMBULATORY_CARE_PROVIDER_SITE_OTHER): Payer: Self-pay | Admitting: Gastroenterology

## 2024-03-15 ENCOUNTER — Ambulatory Visit: Payer: MEDICAID | Admitting: Family Medicine

## 2024-05-02 ENCOUNTER — Ambulatory Visit: Payer: Self-pay

## 2024-05-02 NOTE — Telephone Encounter (Signed)
 Patient scheduled.

## 2024-05-02 NOTE — Telephone Encounter (Signed)
 FYI Only or Action Required?: FYI only for provider.  Patient was last seen in primary care on 11/11/2023 by Terry Wilhelmena Lloyd Hilario, FNP.  Called Nurse Triage reporting No chief complaint on file..  Symptoms began several years ago.  Interventions attempted: Nothing.  Symptoms are: gradually worsening.  Triage Disposition: See PCP When Office is Open (Within 3 Days)  Patient/caregiver understands and will follow disposition?: Yes  Copied from CRM #[id number]. Topic: Clinical - Medication Question >> May 02, 2024  9:01 AM Avram MATSU wrote: Reason for CRM: patient is requesting pain medication and would like to know if the provider can send him in a rx. He's not ure what medication he took in the past. Please advise [phone] Reason for Disposition  [1] MODERATE pain (e.g., interferes with normal activities) AND [2] present > 3 days  Answer Assessment - Initial Assessment Questions 1. ONSET: When did the muscle aches or body pains start?      Chronic  2. LOCATION: What part of your body is hurting? (e.g., entire body, arms, legs)      Entire Left Side  3. SEVERITY: How bad is the pain? (Scale 1-10; or mild, moderate, severe)     Moderate to Severe  4. CAUSE: What do you think is causing the pains?     Previous Trauma  5. FEVER: Do you have a fever? If Yes, ask: What is your temperature, how was it measured, and  when did it start?      No  6. OTHER SYMPTOMS: Do you have any other symptoms? (e.g., chest pain, cold or flu symptoms, rash, weakness, weight loss)     Numbness, Tingling in the hip  TRAVEL: Have you traveled out of the country in the last month? (e.g., exposures, travel history)     No  Patient is requesting a prescription for pain medication due to chronic pain and is stating he is needing it as soon as possible.  Protocols used: Muscle Aches and Body Pain-A-AH

## 2024-05-03 ENCOUNTER — Ambulatory Visit: Payer: MEDICAID | Admitting: Family Medicine

## 2024-05-03 ENCOUNTER — Ambulatory Visit (HOSPITAL_COMMUNITY)
Admission: RE | Admit: 2024-05-03 | Discharge: 2024-05-03 | Disposition: A | Discharge status: Home / Self Care | Payer: MEDICAID | Source: Ambulatory Visit | Attending: Family Medicine | Admitting: Family Medicine

## 2024-05-03 VITALS — BP 117/72

## 2024-05-03 DIAGNOSIS — M25552 Pain in left hip: Secondary | ICD-10-CM

## 2024-05-03 DIAGNOSIS — M545 Low back pain, unspecified: Secondary | ICD-10-CM | POA: Diagnosis present

## 2024-05-03 MED ORDER — TRAMADOL HCL 50 MG PO TABS: 50.0000 mg | ORAL_TABLET | Freq: Two times a day (BID) | ORAL | 0 refills | Status: DC | PRN

## 2024-05-03 NOTE — Assessment & Plan Note (Addendum)
 Tramadol  50 mg PRN Xrays ordered  Discussed to focus on maintaining good posture, using lumbar support while sitting, and avoiding prolonged sitting or heavy lifting. Engage in low-impact exercises like walking or swimming to strengthen core muscles and reduce strain on the spine. Apply heat or ice packs as needed for pain relief and consider physical therapy for targeted exercises.

## 2024-05-03 NOTE — Patient Instructions (Signed)

## 2024-05-03 NOTE — Progress Notes (Signed)
 Established Patient Office Visit   Subjective  Patient ID: Caleb Kim, male    DOB: 1960/08/27  Age: 63 y.o. MRN: [id number]  Chief Complaint  Patient presents with   Body pain     States he has damage to his entire left side and his pain medicine regime isn't helping and needs something stronger or an increase. Has been hurting in his left hip and his back. But his whole left side hurts him     He  has a past medical history of Alcohol withdrawal (HCC) and Osteoarthritis.  Back Pain The patient presents with chronic low back pain that occurs constantly and has been gradually worsening since onset. The pain is located in the lumbar spine and radiates to the left hip. It is described as aching, burning, and cramping, with a severity of 7/10, and remains consistent throughout the day. Symptoms are aggravated by lying down, bending, and standing. Associated symptoms include leg pain, numbness, and tingling. He denies chest pain, fever, or headaches. Risk factors include lack of exercise and poor posture. Previous treatments, including muscle relaxants, bed rest, and analgesics, have not provided relief.    Review of Systems  Constitutional:  Negative for chills and fever.  Eyes:  Negative for blurred vision.  Respiratory:  Negative for shortness of breath.   Cardiovascular:  Negative for chest pain.  Musculoskeletal:  Positive for back pain and myalgias.       Left hip pain  Neurological:  Positive for tingling. Negative for dizziness and headaches.      Objective:     BP 117/72  BP Readings from Last 3 Encounters:  05/03/24 117/72  11/22/23 (!) 144/95  11/11/23 106/71      Physical Exam Vitals reviewed.  Constitutional:      General: He is not in acute distress.    Appearance: Normal appearance. He is not ill-appearing, toxic-appearing or diaphoretic.  HENT:     Head: Normocephalic.  Eyes:     General:        Right eye: No discharge.        Left eye: No discharge.      Conjunctiva/sclera: Conjunctivae normal.  Cardiovascular:     Rate and Rhythm: Normal rate.     Pulses: Normal pulses.     Heart sounds: Normal heart sounds.  Pulmonary:     Effort: Pulmonary effort is normal. No respiratory distress.     Breath sounds: Normal breath sounds.  Abdominal:     General: Bowel sounds are normal.     Palpations: Abdomen is soft.     Tenderness: There is no abdominal tenderness. There is no right CVA tenderness, left CVA tenderness or guarding.  Musculoskeletal:     Thoracic back: Spasms and tenderness present. Decreased range of motion.     Lumbar back: Spasms and tenderness present. Decreased range of motion. Positive right straight leg raise test and positive left straight leg raise test.  Skin:    General: Skin is warm and dry.     Capillary Refill: Capillary refill takes less than 2 seconds.  Neurological:     Mental Status: He is alert.  Psychiatric:        Mood and Affect: Mood normal.        Behavior: Behavior normal.      No results found for any visits on 05/03/24.  The 10-year ASCVD risk score (Arnett DK, et al., 2019) is: 10.6%    Assessment & Plan:  Lumbar pain  Assessment & Plan: Tramadol  50 mg PRN Xrays ordered  Discussed to focus on maintaining good posture, using lumbar support while sitting, and avoiding prolonged sitting or heavy lifting. Engage in low-impact exercises like walking or swimming to strengthen core muscles and reduce strain on the spine. Apply heat or ice packs as needed for pain relief and consider physical therapy for targeted exercises.   Orders: -     DG Lumbar Spine Complete; Future -     traMADol  HCl; Take 1 tablet (50 mg total) by mouth every 12 (twelve) hours as needed for up to 21 doses.  Dispense: 21 tablet; Refill: 0  Left hip pain -     DG HIP UNILAT W OR W/O PELVIS 2-3 VIEWS LEFT; Future    Return in about 4 months (around 09/02/2024), or if symptoms worsen or fail to improve, for chronic  follow-up.   Hilario Kidd Wilhelmena Falter, FNP

## 2024-05-08 ENCOUNTER — Telehealth: Payer: Self-pay | Admitting: Family Medicine

## 2024-05-08 NOTE — Telephone Encounter (Signed)
 Awaiting results

## 2024-05-08 NOTE — Telephone Encounter (Signed)
 Patient called about his x-ray results.

## 2024-05-11 ENCOUNTER — Other Ambulatory Visit: Payer: Self-pay | Admitting: Family Medicine

## 2024-05-11 ENCOUNTER — Ambulatory Visit: Payer: Self-pay | Admitting: Family Medicine

## 2024-05-11 DIAGNOSIS — M51362 Other intervertebral disc degeneration, lumbar region with discogenic back pain and lower extremity pain: Secondary | ICD-10-CM

## 2024-05-11 NOTE — Progress Notes (Signed)
 Please inform patient,   Your spine and pelvis X-rays show no fractures or dislocations. There are some age-related changes, including degenerative disc disease at L1-L2 and mild arthritis in the lower back joints. These changes can cause stiffness or back pain but are common as we get older.   Plan:  Manage symptoms with over-the-counter pain medication, stretching, and gentle exercise as tolerated.  Referral placed to physical therapy to strengthen your core and support your spine.

## 2024-05-11 NOTE — Progress Notes (Signed)
 Please inform patient,  Your hip and pelvic X-rays are normal no fractures, dislocations, or major joint problems were seen. There was some calcium buildup in your blood vessels, which is common as we age.  Plan: No broken bones were found. Manage pain with over-the-counter medication, ice/heat, and gentle movement as tolerated.

## 2024-05-17 ENCOUNTER — Ambulatory Visit: Payer: Self-pay | Admitting: Family Medicine

## 2024-05-24 ENCOUNTER — Ambulatory Visit: Payer: MEDICAID | Admitting: Nurse Practitioner

## 2024-05-26 ENCOUNTER — Ambulatory Visit: Payer: Self-pay | Admitting: Physician Assistant

## 2024-06-01 ENCOUNTER — Encounter: Payer: Self-pay | Admitting: Family Medicine

## 2024-06-01 ENCOUNTER — Ambulatory Visit: Payer: MEDICAID | Admitting: Family Medicine

## 2024-06-01 VITALS — BP 121/82 | HR 95 | Resp 18 | Ht 66.0 in | Wt 116.1 lb

## 2024-06-01 DIAGNOSIS — M545 Low back pain, unspecified: Secondary | ICD-10-CM | POA: Diagnosis not present

## 2024-06-01 DIAGNOSIS — Z1211 Encounter for screening for malignant neoplasm of colon: Secondary | ICD-10-CM | POA: Diagnosis not present

## 2024-06-01 NOTE — Progress Notes (Signed)
 Established Patient Office Visit  Subjective:  Patient ID: Caleb Kim, male    DOB: 05-Apr-1961  Age: 63 y.o. MRN: [id number]  CC:  Chief Complaint  Patient presents with   Medical Management of Chronic Issues    Follow up on back pain from last month     HPI Caleb Kim is a 63 y.o. male with past medical history of  lumbar pain, osteoarthritis presents for f/u of  chronic medical conditions.  For the details of today's visit, please refer to the assessment and plan.       Past Medical History:  Diagnosis Date   Alcohol withdrawal (HCC)    Osteoarthritis     Past Surgical History:  Procedure Laterality Date   EXPLORATORY LAPAROTOMY      History reviewed. No pertinent family history.  Social History   Socioeconomic History   Marital status: Married    Spouse name: Not on file   Number of children: Not on file   Years of education: Not on file   Highest education level: Not on file  Occupational History   Not on file  Tobacco Use   Smoking status: Every Day    Types: Cigars   Smokeless tobacco: Never  Vaping Use   Vaping status: Never Used  Substance and Sexual Activity   Alcohol use: Yes    Comment: drinks beer daily   Drug use: No   Sexual activity: Not Currently  Other Topics Concern   Not on file  Social History Narrative   Not on file   Social Drivers of Health   Financial Resource Strain: Not on file  Food Insecurity: Food Insecurity Present (05/15/2022)   Hunger Vital Sign    Worried About Running Out of Food in the Last Year: Never true    Ran Out of Food in the Last Year: Sometimes true  Transportation Needs: No Transportation Needs (05/15/2022)   PRAPARE - Administrator, Civil Service (Medical): No    Lack of Transportation (Non-Medical): No  Physical Activity: Not on file  Stress: Not on file  Social Connections: Unknown (09/03/2022)   [facility]   Social Network    Social Network: Not on file   Intimate Partner Violence: Unknown (09/03/2022)   Received from [facility]   HITS    Physically Hurt: Not on file    Insult or Talk Down To: Not on file    Threaten Physical Harm: Not on file    Scream or Curse: Not on file    Outpatient Medications Prior to Visit  Medication Sig Dispense Refill   albuterol  (VENTOLIN  HFA) 108 (90 Base) MCG/ACT inhaler Inhale 2 puffs into the lungs every 6 (six) hours as needed for wheezing or shortness of breath. 8 g 2   chlordiazePOXIDE  (LIBRIUM ) 25 MG capsule 50mg  PO TID x 2D, then 25-50mg  PO BID X 2D, then 25-50mg  PO QD X 1D 20 capsule 0   cyclobenzaprine  (FLEXERIL ) 10 MG tablet Take 1 tablet (10 mg total) by mouth 3 (three) times daily as needed for muscle spasms. 30 tablet 0   meloxicam  (MOBIC ) 7.5 MG tablet Take 1 tablet (7.5 mg total) by mouth daily. 30 tablet 0   Nebulizer System All-In-One MISC 1 Device by Does not apply route as directed. 1 each PRN   traMADol  (ULTRAM ) 50 MG tablet Take 1 tablet (50 mg total) by mouth every 12 (twelve) hours as needed for up to 21 doses. 21 tablet 0  No facility-administered medications prior to visit.    No Known Allergies  ROS Review of Systems  Constitutional:  Negative for fatigue and fever.  Eyes:  Negative for visual disturbance.  Respiratory:  Negative for chest tightness and shortness of breath.   Cardiovascular:  Negative for chest pain and palpitations.  Neurological:  Negative for dizziness and headaches.      Objective:    Physical Exam HENT:     Head: Normocephalic.     Right Ear: External ear normal.     Left Ear: External ear normal.     Nose: No congestion or rhinorrhea.     Mouth/Throat:     Mouth: Mucous membranes are moist.  Cardiovascular:     Rate and Rhythm: Regular rhythm.     Heart sounds: No murmur heard. Pulmonary:     Effort: No respiratory distress.     Breath sounds: Normal breath sounds.  Neurological:     Mental Status: He is alert.     BP 121/82    Pulse 95   Resp 18   Ht 5' 6 (1.676 m)   Wt 116 lb 1.9 oz (52.7 kg)   SpO2 92%   BMI 18.74 kg/m  Wt Readings from Last 3 Encounters:  06/01/24 116 lb 1.9 oz (52.7 kg)  11/22/23 119 lb 11.2 oz (54.3 kg)  11/11/23 114 lb 6.4 oz (51.9 kg)    Lab Results  Component Value Date   TSH 0.825 12/08/2022   Lab Results  Component Value Date   WBC 3.0 (L) 08/03/2023   HGB 13.8 08/03/2023   HCT 43.8 08/03/2023   MCV 98.4 08/03/2023   PLT 116 (L) 08/03/2023   Lab Results  Component Value Date   NA 133 (L) 11/10/2023   K 3.6 11/10/2023   CO2 22 11/10/2023   GLUCOSE 68 (L) 11/10/2023   BUN 7 (L) 11/10/2023   CREATININE 0.67 (L) 11/10/2023   BILITOT 1.0 11/10/2023   ALKPHOS 83 11/10/2023   AST 49 (H) 11/10/2023   ALT 32 11/10/2023   PROT 7.8 11/10/2023   ALBUMIN 4.4 11/10/2023   CALCIUM 9.6 11/10/2023   ANIONGAP 11 08/03/2023   EGFR 107 11/10/2023   Lab Results  Component Value Date   CHOL 189 12/08/2022   Lab Results  Component Value Date   HDL 70 12/08/2022   Lab Results  Component Value Date   LDLCALC 97 12/08/2022   Lab Results  Component Value Date   TRIG 129 12/08/2022   Lab Results  Component Value Date   CHOLHDL 2.7 12/08/2022   Lab Results  Component Value Date   HGBA1C 4.9 12/08/2022      Assessment & Plan:  Lumbar pain Assessment & Plan: Stable on current treatment regimen    Colon cancer screening -     Cologuard   Note: This chart has been completed using Engineer, civil (consulting) software, and while attempts have been made to ensure accuracy, certain words and phrases may not be transcribed as intended.     Follow-up: Return in about 4 months (around 10/02/2024).   Guhan Bruington  Z Bacchus, FNP

## 2024-06-01 NOTE — Assessment & Plan Note (Signed)
Stable on current treatment regimen.

## 2024-06-01 NOTE — Patient Instructions (Signed)
 I appreciate the opportunity to provide care to you today!    Follow up:  4 months  Labs:  next visit  For a Healthier YOU, I Recommend: Reducing your intake of sugar, sodium, carbohydrates, and saturated fats. Increasing your fiber intake by incorporating more whole grains, fruits, and vegetables into your meals. Setting healthy goals with a focus on lowering your consumption of carbs, sugar, and unhealthy fats. Adding variety to your diet by including a wide range of fruits and vegetables. Cutting back on soda and limiting processed foods as much as possible. Staying active: In addition to taking your weight loss medication, aim for at least 150 minutes of moderate-intensity physical activity each week for optimal results.    Please follow up if your symptoms worsen or fail to improve.     Please continue to a heart-healthy diet and increase your physical activities. Try to exercise for at least five days a week.    It was a pleasure to see you and I look forward to continuing to work together on your health and well-being. Please do not hesitate to call the office if you need care or have questions about your care.  In case of emergency, please visit the Emergency Department for urgent care, or contact our clinic at [phone] to schedule an appointment. We're here to help you!   Have a wonderful day and week. With Gratitude, Meade JENEANE Gerlach MSN, FNP-BC, PMHNP-BC

## 2024-08-09 ENCOUNTER — Encounter (HOSPITAL_COMMUNITY): Payer: Self-pay

## 2024-08-09 ENCOUNTER — Inpatient Hospital Stay (HOSPITAL_COMMUNITY)
Admission: EM | Admit: 2024-08-09 | Discharge: 2024-08-14 | DRG: 896 | Disposition: A | Discharge status: Home Health | Payer: MEDICAID | Attending: Family Medicine | Admitting: Family Medicine

## 2024-08-09 ENCOUNTER — Other Ambulatory Visit: Payer: Self-pay

## 2024-08-09 DIAGNOSIS — K760 Fatty (change of) liver, not elsewhere classified: Secondary | ICD-10-CM | POA: Diagnosis present

## 2024-08-09 DIAGNOSIS — D62 Acute posthemorrhagic anemia: Secondary | ICD-10-CM | POA: Diagnosis present

## 2024-08-09 DIAGNOSIS — E860 Dehydration: Secondary | ICD-10-CM | POA: Diagnosis present

## 2024-08-09 DIAGNOSIS — R933 Abnormal findings on diagnostic imaging of other parts of digestive tract: Secondary | ICD-10-CM

## 2024-08-09 DIAGNOSIS — K8020 Calculus of gallbladder without cholecystitis without obstruction: Secondary | ICD-10-CM | POA: Diagnosis present

## 2024-08-09 DIAGNOSIS — E876 Hypokalemia: Secondary | ICD-10-CM | POA: Diagnosis present

## 2024-08-09 DIAGNOSIS — F17290 Nicotine dependence, other tobacco product, uncomplicated: Secondary | ICD-10-CM | POA: Diagnosis present

## 2024-08-09 DIAGNOSIS — K7400 Hepatic fibrosis, unspecified: Secondary | ICD-10-CM | POA: Diagnosis present

## 2024-08-09 DIAGNOSIS — K7010 Alcoholic hepatitis without ascites: Secondary | ICD-10-CM | POA: Diagnosis present

## 2024-08-09 DIAGNOSIS — N400 Enlarged prostate without lower urinary tract symptoms: Secondary | ICD-10-CM | POA: Diagnosis present

## 2024-08-09 DIAGNOSIS — F1010 Alcohol abuse, uncomplicated: Secondary | ICD-10-CM | POA: Diagnosis present

## 2024-08-09 DIAGNOSIS — E878 Other disorders of electrolyte and fluid balance, not elsewhere classified: Secondary | ICD-10-CM | POA: Diagnosis present

## 2024-08-09 DIAGNOSIS — G8929 Other chronic pain: Secondary | ICD-10-CM | POA: Diagnosis present

## 2024-08-09 DIAGNOSIS — R748 Abnormal levels of other serum enzymes: Secondary | ICD-10-CM | POA: Diagnosis present

## 2024-08-09 DIAGNOSIS — R001 Bradycardia, unspecified: Secondary | ICD-10-CM | POA: Diagnosis present

## 2024-08-09 DIAGNOSIS — Z79899 Other long term (current) drug therapy: Secondary | ICD-10-CM

## 2024-08-09 DIAGNOSIS — R066 Hiccough: Secondary | ICD-10-CM | POA: Diagnosis present

## 2024-08-09 DIAGNOSIS — M545 Low back pain, unspecified: Secondary | ICD-10-CM | POA: Diagnosis present

## 2024-08-09 DIAGNOSIS — N3289 Other specified disorders of bladder: Secondary | ICD-10-CM | POA: Diagnosis present

## 2024-08-09 DIAGNOSIS — F10939 Alcohol use, unspecified with withdrawal, unspecified: Secondary | ICD-10-CM | POA: Diagnosis present

## 2024-08-09 DIAGNOSIS — F10239 Alcohol dependence with withdrawal, unspecified: Secondary | ICD-10-CM | POA: Diagnosis present

## 2024-08-09 DIAGNOSIS — K921 Melena: Secondary | ICD-10-CM

## 2024-08-09 DIAGNOSIS — M1990 Unspecified osteoarthritis, unspecified site: Secondary | ICD-10-CM | POA: Diagnosis present

## 2024-08-09 DIAGNOSIS — F10288 Alcohol dependence with other alcohol-induced disorder: Principal | ICD-10-CM | POA: Diagnosis present

## 2024-08-09 DIAGNOSIS — F32A Depression, unspecified: Secondary | ICD-10-CM | POA: Diagnosis present

## 2024-08-09 DIAGNOSIS — E871 Hypo-osmolality and hyponatremia: Principal | ICD-10-CM | POA: Diagnosis present

## 2024-08-09 DIAGNOSIS — R053 Chronic cough: Secondary | ICD-10-CM | POA: Diagnosis present

## 2024-08-09 DIAGNOSIS — R1111 Vomiting without nausea: Secondary | ICD-10-CM

## 2024-08-09 DIAGNOSIS — F1011 Alcohol abuse, in remission: Secondary | ICD-10-CM

## 2024-08-09 DIAGNOSIS — K2920 Alcoholic gastritis without bleeding: Secondary | ICD-10-CM | POA: Diagnosis present

## 2024-08-09 DIAGNOSIS — Z791 Long term (current) use of non-steroidal anti-inflammatories (NSAID): Secondary | ICD-10-CM

## 2024-08-09 DIAGNOSIS — Z720 Tobacco use: Secondary | ICD-10-CM | POA: Diagnosis present

## 2024-08-09 DIAGNOSIS — K449 Diaphragmatic hernia without obstruction or gangrene: Secondary | ICD-10-CM | POA: Diagnosis present

## 2024-08-09 DIAGNOSIS — K2101 Gastro-esophageal reflux disease with esophagitis, with bleeding: Secondary | ICD-10-CM | POA: Diagnosis present

## 2024-08-09 NOTE — ED Triage Notes (Signed)
 POV from home. Cc of hiccups and emesis for 3 days. Has fallen twice today but does not have any injuries.  Family says that when he gets like this his Na & K is low.  Says his ribs are sore. 6/10 form the hiccups.

## 2024-08-10 ENCOUNTER — Emergency Department (HOSPITAL_COMMUNITY): Payer: MEDICAID

## 2024-08-10 ENCOUNTER — Ambulatory Visit (HOSPITAL_COMMUNITY): Payer: MEDICAID

## 2024-08-10 ENCOUNTER — Encounter (HOSPITAL_COMMUNITY): Payer: Self-pay

## 2024-08-10 DIAGNOSIS — K2970 Gastritis, unspecified, without bleeding: Secondary | ICD-10-CM | POA: Diagnosis not present

## 2024-08-10 DIAGNOSIS — F32A Depression, unspecified: Secondary | ICD-10-CM | POA: Diagnosis present

## 2024-08-10 DIAGNOSIS — R933 Abnormal findings on diagnostic imaging of other parts of digestive tract: Secondary | ICD-10-CM | POA: Diagnosis not present

## 2024-08-10 DIAGNOSIS — E871 Hypo-osmolality and hyponatremia: Secondary | ICD-10-CM

## 2024-08-10 DIAGNOSIS — N3289 Other specified disorders of bladder: Secondary | ICD-10-CM | POA: Diagnosis present

## 2024-08-10 DIAGNOSIS — E878 Other disorders of electrolyte and fluid balance, not elsewhere classified: Secondary | ICD-10-CM | POA: Diagnosis present

## 2024-08-10 DIAGNOSIS — F10130 Alcohol abuse with withdrawal, uncomplicated: Secondary | ICD-10-CM | POA: Diagnosis not present

## 2024-08-10 DIAGNOSIS — R748 Abnormal levels of other serum enzymes: Secondary | ICD-10-CM | POA: Diagnosis not present

## 2024-08-10 DIAGNOSIS — K31A11 Gastric intestinal metaplasia without dysplasia, involving the antrum: Secondary | ICD-10-CM | POA: Diagnosis not present

## 2024-08-10 DIAGNOSIS — G8929 Other chronic pain: Secondary | ICD-10-CM | POA: Diagnosis present

## 2024-08-10 DIAGNOSIS — E876 Hypokalemia: Secondary | ICD-10-CM | POA: Diagnosis present

## 2024-08-10 DIAGNOSIS — N400 Enlarged prostate without lower urinary tract symptoms: Secondary | ICD-10-CM | POA: Diagnosis present

## 2024-08-10 DIAGNOSIS — K7400 Hepatic fibrosis, unspecified: Secondary | ICD-10-CM | POA: Diagnosis present

## 2024-08-10 DIAGNOSIS — K921 Melena: Secondary | ICD-10-CM | POA: Diagnosis not present

## 2024-08-10 DIAGNOSIS — R053 Chronic cough: Secondary | ICD-10-CM | POA: Diagnosis present

## 2024-08-10 DIAGNOSIS — F10288 Alcohol dependence with other alcohol-induced disorder: Secondary | ICD-10-CM | POA: Diagnosis present

## 2024-08-10 DIAGNOSIS — F1010 Alcohol abuse, uncomplicated: Secondary | ICD-10-CM | POA: Diagnosis not present

## 2024-08-10 DIAGNOSIS — R001 Bradycardia, unspecified: Secondary | ICD-10-CM | POA: Diagnosis present

## 2024-08-10 DIAGNOSIS — K2101 Gastro-esophageal reflux disease with esophagitis, with bleeding: Secondary | ICD-10-CM | POA: Diagnosis present

## 2024-08-10 DIAGNOSIS — K7010 Alcoholic hepatitis without ascites: Secondary | ICD-10-CM | POA: Diagnosis present

## 2024-08-10 DIAGNOSIS — K2920 Alcoholic gastritis without bleeding: Secondary | ICD-10-CM | POA: Diagnosis present

## 2024-08-10 DIAGNOSIS — E860 Dehydration: Secondary | ICD-10-CM | POA: Diagnosis present

## 2024-08-10 DIAGNOSIS — K2950 Unspecified chronic gastritis without bleeding: Secondary | ICD-10-CM | POA: Diagnosis not present

## 2024-08-10 DIAGNOSIS — K760 Fatty (change of) liver, not elsewhere classified: Secondary | ICD-10-CM | POA: Diagnosis present

## 2024-08-10 DIAGNOSIS — K209 Esophagitis, unspecified without bleeding: Secondary | ICD-10-CM | POA: Diagnosis not present

## 2024-08-10 DIAGNOSIS — F10239 Alcohol dependence with withdrawal, unspecified: Secondary | ICD-10-CM | POA: Diagnosis present

## 2024-08-10 DIAGNOSIS — K8020 Calculus of gallbladder without cholecystitis without obstruction: Secondary | ICD-10-CM | POA: Diagnosis present

## 2024-08-10 DIAGNOSIS — R195 Other fecal abnormalities: Secondary | ICD-10-CM | POA: Diagnosis not present

## 2024-08-10 DIAGNOSIS — K449 Diaphragmatic hernia without obstruction or gangrene: Secondary | ICD-10-CM | POA: Diagnosis present

## 2024-08-10 DIAGNOSIS — R1110 Vomiting, unspecified: Secondary | ICD-10-CM | POA: Diagnosis present

## 2024-08-10 DIAGNOSIS — M545 Low back pain, unspecified: Secondary | ICD-10-CM | POA: Diagnosis present

## 2024-08-10 DIAGNOSIS — M1990 Unspecified osteoarthritis, unspecified site: Secondary | ICD-10-CM | POA: Diagnosis present

## 2024-08-10 DIAGNOSIS — D62 Acute posthemorrhagic anemia: Secondary | ICD-10-CM | POA: Diagnosis present

## 2024-08-10 DIAGNOSIS — F17290 Nicotine dependence, other tobacco product, uncomplicated: Secondary | ICD-10-CM | POA: Diagnosis present

## 2024-08-10 LAB — BASIC METABOLIC PANEL WITH GFR
Anion gap: 13 (ref 5–15)
BUN: 7 mg/dL — ABNORMAL LOW (ref 8–23)
BUN: 6 mg/dL — ABNORMAL LOW (ref 8–23)
CO2: 34 mmol/L — ABNORMAL HIGH (ref 22–32)
CO2: 33 mmol/L — ABNORMAL HIGH (ref 22–32)
Calcium: 8.4 mg/dL — ABNORMAL LOW (ref 8.9–10.3)
Calcium: 8.4 mg/dL — ABNORMAL LOW (ref 8.9–10.3)
Chloride: <65 mmol/L — CL (ref 98–111)
Chloride: 67 mmol/L — ABNORMAL LOW (ref 98–111)
Creatinine, Ser: 0.6 mg/dL — ABNORMAL LOW (ref 0.61–1.24)
Creatinine, Ser: 0.54 mg/dL — ABNORMAL LOW (ref 0.61–1.24)
GFR, Estimated: >60 mL/min (ref >60)
GFR, Estimated: >60 mL/min (ref >60)
Glucose, Bld: 123 mg/dL — ABNORMAL HIGH (ref 70–99)
Glucose, Bld: 94 mg/dL (ref 70–99)
Potassium: 2.9 mmol/L — ABNORMAL LOW (ref 3.5–5.1)
Potassium: 2.9 mmol/L — ABNORMAL LOW (ref 3.5–5.1)
Sodium: 109 mmol/L — CL (ref 135–145)
Sodium: 112 mmol/L — CL (ref 135–145)

## 2024-08-10 LAB — CBC WITH DIFFERENTIAL/PLATELET
Abs Immature Granulocytes: 0.05 K/uL (ref 0.00–0.07)
Abs Immature Granulocytes: 0.06 K/uL (ref 0.00–0.07)
Basophils Absolute: 0 K/uL (ref 0.0–0.1)
Basophils Absolute: 0 K/uL (ref 0.0–0.1)
Basophils Relative: 0 %
Basophils Relative: 0 %
Eosinophils Absolute: 0 K/uL (ref 0.0–0.5)
Eosinophils Absolute: 0 K/uL (ref 0.0–0.5)
Eosinophils Relative: 0 %
Eosinophils Relative: 0 %
HCT: 34.1 % — ABNORMAL LOW (ref 39.0–52.0)
HCT: 35.1 % — ABNORMAL LOW (ref 39.0–52.0)
Hemoglobin: 12.7 g/dL — ABNORMAL LOW (ref 13.0–17.0)
Hemoglobin: 13.1 g/dL (ref 13.0–17.0)
Immature Granulocytes: 1 %
Immature Granulocytes: 1 %
Lymphocytes Relative: 5 %
Lymphocytes Relative: 7 %
Lymphs Abs: 0.5 K/uL — ABNORMAL LOW (ref 0.7–4.0)
Lymphs Abs: 0.6 K/uL — ABNORMAL LOW (ref 0.7–4.0)
MCH: 31.8 pg (ref 26.0–34.0)
MCH: 31.8 pg (ref 26.0–34.0)
MCHC: 37.2 g/dL — ABNORMAL HIGH (ref 30.0–36.0)
MCHC: 37.3 g/dL — ABNORMAL HIGH (ref 30.0–36.0)
MCV: 85.2 fL (ref 80.0–100.0)
MCV: 85.5 fL (ref 80.0–100.0)
Monocytes Absolute: 0.9 K/uL (ref 0.1–1.0)
Monocytes Absolute: 1 K/uL (ref 0.1–1.0)
Monocytes Relative: 10 %
Monocytes Relative: 11 %
Neutro Abs: 7.1 K/uL (ref 1.7–7.7)
Neutro Abs: 7.3 K/uL (ref 1.7–7.7)
Neutrophils Relative %: 81 %
Neutrophils Relative %: 84 %
Platelets: 107 K/uL — ABNORMAL LOW (ref 150–400)
Platelets: 129 K/uL — ABNORMAL LOW (ref 150–400)
RBC: 3.99 MIL/uL — ABNORMAL LOW (ref 4.22–5.81)
RBC: 4.12 MIL/uL — ABNORMAL LOW (ref 4.22–5.81)
RDW: 10.9 % — ABNORMAL LOW (ref 11.5–15.5)
RDW: 10.9 % — ABNORMAL LOW (ref 11.5–15.5)
WBC: 8.6 K/uL (ref 4.0–10.5)
WBC: 8.9 K/uL (ref 4.0–10.5)
nRBC: 0 % (ref 0.0–0.2)
nRBC: 0 % (ref 0.0–0.2)

## 2024-08-10 LAB — URINALYSIS, ROUTINE W REFLEX MICROSCOPIC
Bilirubin Urine: NEGATIVE
Glucose, UA: NEGATIVE mg/dL
Hgb urine dipstick: NEGATIVE
Ketones, ur: NEGATIVE mg/dL
Leukocytes,Ua: NEGATIVE
Nitrite: NEGATIVE
Protein, ur: NEGATIVE mg/dL
Specific Gravity, Urine: 1.008 (ref 1.005–1.030)
pH: 7 (ref 5.0–8.0)

## 2024-08-10 LAB — SODIUM
Sodium: 118 mmol/L — CL (ref 135–145)
Sodium: 122 mmol/L — ABNORMAL LOW (ref 135–145)
Sodium: 126 mmol/L — ABNORMAL LOW (ref 135–145)
Sodium: 125 mmol/L — ABNORMAL LOW (ref 135–145)
Sodium: 124 mmol/L — ABNORMAL LOW (ref 135–145)

## 2024-08-10 LAB — COMPREHENSIVE METABOLIC PANEL WITH GFR
ALT: 70 U/L — ABNORMAL HIGH (ref 0–44)
AST: 124 U/L — ABNORMAL HIGH (ref 15–41)
Albumin: 4 g/dL (ref 3.5–5.0)
Alkaline Phosphatase: 87 U/L (ref 38–126)
Anion gap: 13 (ref 5–15)
BUN: 6 mg/dL — ABNORMAL LOW (ref 8–23)
CO2: 31 mmol/L (ref 22–32)
Calcium: 8.4 mg/dL — ABNORMAL LOW (ref 8.9–10.3)
Chloride: 73 mmol/L — ABNORMAL LOW (ref 98–111)
Creatinine, Ser: 0.59 mg/dL — ABNORMAL LOW (ref 0.61–1.24)
GFR, Estimated: >60 mL/min (ref >60)
Glucose, Bld: 82 mg/dL (ref 70–99)
Potassium: 3.1 mmol/L — ABNORMAL LOW (ref 3.5–5.1)
Sodium: 117 mmol/L — CL (ref 135–145)
Total Bilirubin: 1.1 mg/dL (ref 0.0–1.2)
Total Protein: 7.3 g/dL (ref 6.5–8.1)

## 2024-08-10 LAB — MAGNESIUM
Magnesium: 1.5 mg/dL — ABNORMAL LOW (ref 1.7–2.4)
Magnesium: 2.5 mg/dL — ABNORMAL HIGH (ref 1.7–2.4)

## 2024-08-10 LAB — PHOSPHORUS: Phosphorus: 2.2 mg/dL — ABNORMAL LOW (ref 2.5–4.6)

## 2024-08-10 LAB — ETHANOL: Alcohol, Ethyl (B): 143 mg/dL — ABNORMAL HIGH (ref <15)

## 2024-08-10 LAB — MRSA NEXT GEN BY PCR, NASAL: MRSA by PCR Next Gen: NOT DETECTED

## 2024-08-10 MED ORDER — CHLORDIAZEPOXIDE HCL 5 MG PO CAPS: 5.0000 mg | ORAL_CAPSULE | Freq: Three times a day (TID) | ORAL | Status: DC

## 2024-08-10 MED ORDER — POTASSIUM CHLORIDE 10 MEQ/100ML IV SOLN
10.0000 meq | INTRAVENOUS | Status: AC
  Filled 2024-08-10: qty 100

## 2024-08-10 MED ORDER — THIAMINE MONONITRATE 100 MG PO TABS
100.0000 mg | ORAL_TABLET | Freq: Every day | ORAL | Status: DC
  Administered 2024-08-10 – 2024-08-13 (×3): 100 mg via ORAL
  Filled 2024-08-10 (×3): qty 1

## 2024-08-10 MED ORDER — THIAMINE HCL 100 MG/ML IJ SOLN
100.0000 mg | Freq: Every day | INTRAMUSCULAR | Status: DC
  Administered 2024-08-11 – 2024-08-14 (×2): 100 mg via INTRAVENOUS
  Filled 2024-08-10 (×2): qty 2

## 2024-08-10 MED ORDER — CHLORHEXIDINE GLUCONATE CLOTH 2 % EX PADS
6.0000 | MEDICATED_PAD | Freq: Every day | CUTANEOUS | Status: DC
  Administered 2024-08-10 – 2024-08-14 (×5): 6 via TOPICAL

## 2024-08-10 MED ORDER — ENOXAPARIN SODIUM 40 MG/0.4ML IJ SOSY
40.0000 mg | PREFILLED_SYRINGE | INTRAMUSCULAR | Status: DC
  Administered 2024-08-10 – 2024-08-11 (×2): 40 mg via SUBCUTANEOUS
  Filled 2024-08-10 (×2): qty 0.4

## 2024-08-10 MED ORDER — PROCHLORPERAZINE EDISYLATE 10 MG/2ML IJ SOLN
5.0000 mg | Freq: Four times a day (QID) | INTRAMUSCULAR | Status: DC | PRN
  Administered 2024-08-11: 5 mg via INTRAVENOUS
  Filled 2024-08-10: qty 2

## 2024-08-10 MED ORDER — CHLORDIAZEPOXIDE HCL 25 MG PO CAPS
25.0000 mg | ORAL_CAPSULE | Freq: Three times a day (TID) | ORAL | Status: DC
  Administered 2024-08-10 (×3): 25 mg via ORAL
  Filled 2024-08-10 (×3): qty 1

## 2024-08-10 MED ORDER — LORAZEPAM 2 MG/ML IJ SOLN
1.0000 mg | INTRAMUSCULAR | Status: DC | PRN
  Administered 2024-08-10 – 2024-08-11 (×6): 2 mg via INTRAVENOUS
  Filled 2024-08-10 (×7): qty 1

## 2024-08-10 MED ORDER — POTASSIUM CHLORIDE CRYS ER 20 MEQ PO TBCR
40.0000 meq | EXTENDED_RELEASE_TABLET | Freq: Once | ORAL | Status: AC
  Administered 2024-08-10: 10:00:00 40 meq via ORAL
  Filled 2024-08-10: qty 2

## 2024-08-10 MED ORDER — MELATONIN 5 MG PO TABS: 5.0000 mg | ORAL_TABLET | Freq: Every evening | ORAL | Status: DC | PRN

## 2024-08-10 MED ORDER — SODIUM CHLORIDE 0.9 % IV SOLN: INTRAVENOUS | Status: DC

## 2024-08-10 MED ORDER — ADULT MULTIVITAMIN W/MINERALS CH
1.0000 | ORAL_TABLET | Freq: Every day | ORAL | Status: DC
  Administered 2024-08-10 – 2024-08-13 (×4): 1 via ORAL
  Filled 2024-08-10 (×4): qty 1

## 2024-08-10 MED ORDER — POLYETHYLENE GLYCOL 3350 17 G PO PACK: 17.0000 g | PACK | Freq: Every day | ORAL | Status: DC | PRN

## 2024-08-10 MED ORDER — CHLORDIAZEPOXIDE HCL 5 MG PO CAPS: 10.0000 mg | ORAL_CAPSULE | Freq: Three times a day (TID) | ORAL | Status: DC

## 2024-08-10 MED ORDER — FOLIC ACID 1 MG PO TABS
1.0000 mg | ORAL_TABLET | Freq: Every day | ORAL | Status: DC
  Administered 2024-08-10 – 2024-08-13 (×4): 1 mg via ORAL
  Filled 2024-08-10 (×4): qty 1

## 2024-08-10 MED ORDER — POTASSIUM CHLORIDE CRYS ER 20 MEQ PO TBCR
40.0000 meq | EXTENDED_RELEASE_TABLET | Freq: Once | ORAL | Status: AC
  Administered 2024-08-10: 02:00:00 40 meq via ORAL
  Filled 2024-08-10: qty 2

## 2024-08-10 MED ORDER — ACETAMINOPHEN 500 MG PO TABS
500.0000 mg | ORAL_TABLET | Freq: Four times a day (QID) | ORAL | Status: DC | PRN
  Administered 2024-08-12 – 2024-08-13 (×2): 500 mg via ORAL
  Filled 2024-08-10 (×2): qty 1

## 2024-08-10 MED ORDER — MAGNESIUM SULFATE 2 GM/50ML IV SOLN
2.0000 g | Freq: Once | INTRAVENOUS | Status: AC
  Administered 2024-08-10: 04:00:00 2 g via INTRAVENOUS
  Filled 2024-08-10: qty 50

## 2024-08-10 MED ORDER — PROCHLORPERAZINE EDISYLATE 10 MG/2ML IJ SOLN
10.0000 mg | Freq: Once | INTRAMUSCULAR | Status: AC
  Administered 2024-08-10: 01:00:00 10 mg via INTRAVENOUS
  Filled 2024-08-10: qty 2

## 2024-08-10 MED ORDER — MELATONIN 3 MG PO TABS
6.0000 mg | ORAL_TABLET | Freq: Every evening | ORAL | Status: DC | PRN
  Administered 2024-08-12: 6 mg via ORAL
  Filled 2024-08-10: qty 2

## 2024-08-10 MED ORDER — LACTATED RINGERS IV BOLUS
1000.0000 mL | Freq: Once | INTRAVENOUS | Status: AC
  Administered 2024-08-10: 01:00:00 1000 mL via INTRAVENOUS

## 2024-08-10 MED ORDER — LORAZEPAM 1 MG PO TABS
1.0000 mg | ORAL_TABLET | ORAL | Status: DC | PRN
  Administered 2024-08-10: 21:00:00 2 mg via ORAL
  Administered 2024-08-10 – 2024-08-11 (×2): 1 mg via ORAL
  Filled 2024-08-10: qty 1
  Filled 2024-08-10: qty 2
  Filled 2024-08-10: qty 1

## 2024-08-10 MED ORDER — PANTOPRAZOLE SODIUM 40 MG IV SOLR
40.0000 mg | Freq: Two times a day (BID) | INTRAVENOUS | Status: DC
  Administered 2024-08-10 – 2024-08-14 (×10): 40 mg via INTRAVENOUS
  Filled 2024-08-10 (×10): qty 10

## 2024-08-10 MED ADMIN — Potassium Chloride Inj 10 mEq/100ML: 10 meq | INTRAVENOUS | @ 06:00:00 | NDC 00338070948

## 2024-08-10 MED ADMIN — Potassium Chloride Inj 10 mEq/100ML: 10 meq | INTRAVENOUS | @ 05:00:00 | NDC 00338070948

## 2024-08-10 MED FILL — Potassium Chloride Inj 10 mEq/100ML: 10.0000 meq | INTRAVENOUS | Qty: 100 | Status: AC

## 2024-08-10 NOTE — Plan of Care (Signed)
°  Problem: Acute Rehab OT Goals (only OT should resolve) Goal: Pt. Will Perform Grooming Flowsheets (Taken 08/10/2024 0926) Pt Will Perform Grooming:  with modified independence  standing Goal: Pt. Will Perform Lower Body Dressing Flowsheets (Taken 08/10/2024 0926) Pt Will Perform Lower Body Dressing: with modified independence Goal: Pt. Will Transfer To Toilet Flowsheets (Taken 08/10/2024 [phone]) Pt Will Transfer to Toilet:  with modified independence  ambulating Goal: Pt. Will Perform Toileting-Clothing Manipulation Flowsheets (Taken 08/10/2024 0926) Pt Will Perform Toileting - Clothing Manipulation and hygiene: with modified independence Goal: Pt/Caregiver Will Perform Home Exercise Program Flowsheets (Taken 08/10/2024 [phone]) Pt/caregiver will Perform Home Exercise Program:  Increased ROM  Increased strength  Right Upper extremity  Left upper extremity  Independently  Sokhna Christoph OT, MOT

## 2024-08-10 NOTE — ED Notes (Signed)
 Family member came to desk asking for warm blanket for patient. Upon entering room patient was trembling. Checked temp to make sure patient did not have fever. Temp normal. Gave patient warm blanket at this time.

## 2024-08-10 NOTE — Hospital Course (Signed)
 63 y.o. male with medical history significant for alcoholism, history of alcohol withdrawal, chronic hyponatremia, chronic lumbar pain, osteoarthritis, history of hiccups, who presents to the ER from home due to hiccups for the past 3 days associated with vomiting and not eating.  [name] at bedside, the patient drinks at least 12 (40 ounces) beers per day, drinks upon awakening, continues all day long, until he goes to bed.  No reported subjective fevers or chills.  Denies abdominal pain, hematemesis, melena, or hematochezia.  Last alcoholic drink was right before coming to the ER.  Feels generally weak.   In the ER, hypertensive, tremulous, tachycardic, and tachypneic.  Lab studies notable for severe hyponatremia 109, severe hypochloremia less than 65, hypokalemia 2.9.  The patient was given LR bolus 1 L x 1, IV antiemetics Compazine  10 mg x 1, KCl 40 mEq x 1.     A CT abdomen pelvis without contrast was done which revealed distal esophageal fluid and circumferential wall thickening which may reflect esophagitis, severe hepatic steatosis and mild hepatomegaly.  Cholelithiasis without pericholecystic inflammatory change and without biliary ductal dilatation.  Moderate bladder distention, moderate prostatic hypertrophy.  TRH, hospitalist service, was asked to admit. He denies having headache or dizziness.

## 2024-08-10 NOTE — Plan of Care (Signed)
°  Problem: Acute Rehab PT Goals(only PT should resolve) Goal: Pt Will Go Supine/Side To Sit Outcome: Progressing Flowsheets (Taken 08/10/2024 1238) Pt will go Supine/Side to Sit: with contact guard assist Goal: Patient Will Transfer Sit To/From Stand Outcome: Progressing Flowsheets (Taken 08/10/2024 1238) Patient will transfer sit to/from stand: with contact guard assist Goal: Pt Will Transfer Bed To Chair/Chair To Bed Outcome: Progressing Flowsheets (Taken 08/10/2024 1238) Pt will Transfer Bed to Chair/Chair to Bed: with contact guard assist Goal: Pt Will Ambulate Outcome: Progressing Flowsheets (Taken 08/10/2024 1238) Pt will Ambulate:  100 feet  with least restrictive assistive device  with contact guard assist   12:38 PM, 08/10/2024 Rosaria Settler, PT, DPT [place] with [facility]

## 2024-08-10 NOTE — ED Provider Notes (Signed)
 [place] EMERGENCY DEPARTMENT AT [facility] Provider Note   CSN: [id number] Arrival date & time: 08/09/24  2327     Patient presents with: Emesis   Caleb Kim is a 63 y.o. male.  {Add pertinent medical, surgical, social history, OB history to HPI:5534} 63 year old male presents for evaluation of hiccups and emesis.  States he has been vomiting for 3 days and also had the hiccups.  States when this has happened before he has low potassium and low sodium.  He states he has had some fatigue as well but denies any diarrhea, fevers, chest pain, cough, shortness of breath, or any other symptoms or concerns.   Emesis Associated symptoms: abdominal pain   Associated symptoms: no arthralgias, no chills, no cough, no fever and no sore throat        Prior to Admission medications  Medication Sig Start Date End Date Taking? Authorizing Provider  albuterol  (VENTOLIN  HFA) 108 (90 Base) MCG/ACT inhaler Inhale 2 puffs into the lungs every 6 (six) hours as needed for wheezing or shortness of breath. 12/04/22   Del Wilhelmena Lloyd Sola, FNP  chlordiazePOXIDE  (LIBRIUM ) 25 MG capsule 50mg  PO TID x 2D, then 25-50mg  PO BID X 2D, then 25-50mg  PO QD X 1D 08/03/23   Mesner, Selinda, MD  cyclobenzaprine  (FLEXERIL ) 10 MG tablet Take 1 tablet (10 mg total) by mouth 3 (three) times daily as needed for muscle spasms. 04/12/23   Del Orbe Polanco, Iliana, FNP  meloxicam  (MOBIC ) 7.5 MG tablet Take 1 tablet (7.5 mg total) by mouth daily. 08/13/23   Del Wilhelmena Lloyd Sola, FNP  Nebulizer System All-In-One MISC 1 Device by Does not apply route as directed. 05/22/22   Johnson, Clanford L, MD  traMADol  (ULTRAM ) 50 MG tablet Take 1 tablet (50 mg total) by mouth every 12 (twelve) hours as needed for up to 21 doses. 05/03/24   Del Orbe Polanco, Iliana, FNP    Allergies: Patient has no known allergies.    Review of Systems  Constitutional:  Negative for chills and fever.  HENT:  Negative for ear pain and  sore throat.   Eyes:  Negative for pain and visual disturbance.  Respiratory:  Negative for cough and shortness of breath.   Cardiovascular:  Negative for chest pain and palpitations.  Gastrointestinal:  Positive for abdominal pain and vomiting.  Genitourinary:  Negative for dysuria and hematuria.  Musculoskeletal:  Negative for arthralgias and back pain.  Skin:  Negative for color change and rash.  Neurological:  Negative for seizures and syncope.  All other systems reviewed and are negative.   Updated Vital Signs BP (!) 153/98 (BP Location: Right Arm)   Pulse 91   Temp (!) 97.5 F (36.4 C) (Oral)   Resp 16   Ht 5' 6 (1.676 m)   Wt 61.2 kg   SpO2 97%   BMI 21.79 kg/m   Physical Exam Vitals and nursing note reviewed.  Constitutional:      General: He is not in acute distress.    Appearance: He is well-developed.  HENT:     Head: Normocephalic and atraumatic.  Eyes:     Conjunctiva/sclera: Conjunctivae normal.  Cardiovascular:     Rate and Rhythm: Normal rate and regular rhythm.     Heart sounds: No murmur heard. Pulmonary:     Effort: Pulmonary effort is normal. No respiratory distress.     Breath sounds: Normal breath sounds.  Abdominal:     General: There is distension.  Palpations: Abdomen is soft.     Tenderness: There is no abdominal tenderness.  Musculoskeletal:        General: No swelling.     Cervical back: Neck supple.  Skin:    General: Skin is warm and dry.     Capillary Refill: Capillary refill takes less than 2 seconds.  Neurological:     Mental Status: He is alert.  Psychiatric:        Mood and Affect: Mood normal.     (all labs ordered are listed, but only abnormal results are displayed) Labs Reviewed  BASIC METABOLIC PANEL WITH GFR  CBC WITH DIFFERENTIAL/PLATELET  URINALYSIS, ROUTINE W REFLEX MICROSCOPIC    EKG: None  Radiology: No results found.  {Document cardiac monitor, telemetry assessment procedure when  appropriate:32947} Procedures   Medications Ordered in the ED  lactated ringers  bolus 1,000 mL (has no administration in time range)  prochlorperazine  (COMPAZINE ) injection 10 mg (has no administration in time range)      {Click here for ABCD2, HEART and other calculators REFRESH Note before signing:1}                              Medical Decision Making Amount and/or Complexity of Data Reviewed Labs: ordered. Radiology: ordered.  Risk Prescription drug management.   ***  {Document critical care time when appropriate  Document review of labs and clinical decision tools ie CHADS2VASC2, etc  Document your independent review of radiology images and any outside records  Document your discussion with family members, caretakers and with consultants  Document social determinants of health affecting pt's care  Document your decision making why or why not admission, treatments were needed:32947:::1}   Final diagnoses:  None    ED Discharge Orders     None

## 2024-08-10 NOTE — ED Notes (Signed)
 IV access attempted x 2 without success- pt being connected to cardiac monitor at this time. Ileana, charge nurse will attempt IV access now.

## 2024-08-10 NOTE — H&P (Addendum)
 History and Physical  Markas Aldredge FMW:[id number] DOB: May 24, 1961 DOA: 08/09/2024  Referring physician: Dr. Gennaro, EDP  PCP: Terry Wilhelmena Lloyd Hilario, FNP  Outpatient Specialists: None Patient coming from: Home  Chief Complaint: Drinking alcohol too much, not eating, and vomiting   HPI: Caleb Kim is a 63 y.o. male with medical history significant for alcoholism, history of alcohol withdrawal, chronic hyponatremia, chronic lumbar pain, osteoarthritis, history of hiccups, who presents to the ER from home due to hiccups for the past 3 days associated with vomiting and not eating.  [name] at bedside, the patient drinks at least 12 (40 ounces) beers per day, drinks upon awakening, continues all day long, until he goes to bed.  No reported subjective fevers or chills.  Denies abdominal pain, hematemesis, melena, or hematochezia.  Last alcoholic drink was right before coming to the ER.  Feels generally weak.  In the ER, hypertensive, tremulous, tachycardic, and tachypneic.  Lab studies notable for severe hyponatremia 109, severe hypochloremia less than 65, hypokalemia 2.9.  The patient was given LR bolus 1 L x 1, IV antiemetics Compazine  10 mg x 1, KCl 40 mEq x 1.    A CT abdomen pelvis without contrast was done which revealed distal esophageal fluid and circumferential wall thickening which may reflect esophagitis, severe hepatic steatosis and mild hepatomegaly.  Cholelithiasis without pericholecystic inflammatory change and without biliary ductal dilatation.  Moderate bladder distention, moderate prostatic hypertrophy.  TRH, hospitalist service, was asked to admit.  At the time of the surgery patient is alert and oriented x 3.  He denies having headache or dizziness.  ED Course: Temperature 98.7.  BP 155/74, pulse 103, respiratory rate 22, O2 saturation 95% on room air.  Review of Systems: Review of systems as noted in the HPI. All other systems reviewed and are  negative.   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 He has never used smokeless tobacco. He reports current alcohol use. He reports that he does not use drugs.   Allergies[1]  Family history: None reported.  Prior to Admission medications  Medication Sig Start Date End Date Taking? Authorizing Provider  albuterol  (VENTOLIN  HFA) 108 (90 Base) MCG/ACT inhaler Inhale 2 puffs into the lungs every 6 (six) hours as needed for wheezing or shortness of breath. 12/04/22   Del Wilhelmena Lloyd Hilario, FNP  chlordiazePOXIDE  (LIBRIUM ) 25 MG capsule 50mg  PO TID x 2D, then 25-50mg  PO BID X 2D, then 25-50mg  PO QD X 1D 08/03/23   Mesner, Selinda, MD  cyclobenzaprine  (FLEXERIL ) 10 MG tablet Take 1 tablet (10 mg total) by mouth 3 (three) times daily as needed for muscle spasms. 04/12/23   Del Wilhelmena Lloyd Hilario, FNP  meloxicam  (MOBIC ) 7.5 MG tablet Take 1 tablet (7.5 mg total) by mouth daily. 08/13/23   Del Wilhelmena Lloyd Hilario, FNP  Nebulizer System All-In-One MISC 1 Device by Does not apply route as directed. 05/22/22   Johnson, Clanford L, MD  traMADol  (ULTRAM ) 50 MG tablet Take  1 tablet (50 mg total) by mouth every 12 (twelve) hours as needed for up to 21 doses. 05/03/24   Del Wilhelmena Lloyd Sola, FNP    Physical Exam: BP (!) 165/88   Pulse (!) 105   Temp 98.7 F (37.1 C) (Oral)   Resp (!) 26   Ht 5' 6 (1.676 m)   Wt 61.2 kg   SpO2 99%   BMI 21.79 kg/m   General: 63 y.o. year-old male well  developed well nourished in no acute distress.  Alert and oriented x3. Cardiovascular: Regular rate and rhythm with no rubs or gallops.  No thyromegaly or JVD noted.  No lower extremity edema. 2/4 pulses in all 4 extremities. Respiratory: Clear to auscultation with no wheezes or rales. Good inspiratory effort. Abdomen: Soft nontender nondistended with normal bowel sounds x4 quadrants. Muskuloskeletal: No cyanosis, clubbing or edema noted bilaterally Neuro: CN II-XII intact, strength, sensation, reflexes.  Tremulous. Skin: No ulcerative lesions noted or rashes Psychiatry: Judgement and insight appear normal. Mood is appropriate for condition and setting          Labs on Admission:  Basic Metabolic Panel: Recent Labs  Lab 08/10/24 0007  NA 109*  K 2.9*  CL <65*  CO2 34*  GLUCOSE 123*  BUN 7*  CREATININE 0.60*  CALCIUM 8.4*   Liver Function Tests: No results for input(s): AST, ALT, ALKPHOS, BILITOT, PROT, ALBUMIN in the last 168 hours. No results for input(s): LIPASE, AMYLASE in the last 168 hours. No results for input(s): AMMONIA in the last 168 hours. CBC: Recent Labs  Lab 08/10/24 0007  WBC 8.6  NEUTROABS 7.1  HGB 13.1  HCT 35.1*  MCV 85.2  PLT 129*   Cardiac Enzymes: No results for input(s): CKTOTAL, CKMB, CKMBINDEX, TROPONINI in the last 168 hours.  BNP (last 3 results) No results for input(s): BNP in the last 8760 hours.  ProBNP (last 3 results) No results for input(s): PROBNP in the last 8760 hours.  CBG: No results for input(s): GLUCAP in the last 168 hours.  Radiological Exams on Admission: CT ABDOMEN PELVIS WO CONTRAST Result Date: 08/10/2024 EXAM: CT ABDOMEN AND PELVIS WITHOUT CONTRAST 08/10/2024 12:25:33 AM TECHNIQUE: CT of the abdomen and pelvis was performed without the administration of intravenous contrast. Multiplanar reformatted images are provided for review. Automated exposure control, iterative reconstruction,  and/or weight-based adjustment of the mA/kV was utilized to reduce the radiation dose to as low as reasonably achievable. COMPARISON: Remote prior examination of 12/31/2019. CLINICAL HISTORY: vomiting FINDINGS: LOWER CHEST: The distal esophagus is fluid filled which may reflect changes of gastroesophageal reflux or esophageal dysmotility. There is superimposed circumferential wall thickening involving the visualized distal esophagus which may reflect changes of esophagitis, such as reflux esophagitis. This appears similar to remote prior examination of 12/31/2019, though has not been assessed on this examination. If indicated, this will be better assessed with endoscopy. LIVER: Severe hepatic steatosis. Mild hepatomegaly. GALLBLADDER AND BILE DUCTS: Macro CT gallstones cholelithiasis without superimposed pericholecystic inflammatory change. No intra or extrahepatic biliary ductal dilation. SPLEEN: No acute abnormality. PANCREAS: No acute abnormality. ADRENAL GLANDS: No acute abnormality. KIDNEYS, URETERS AND BLADDER: No stones in the kidneys or ureters. No hydronephrosis. No perinephric or periureteral stranding. Moderate bladder distention with the bladder volume 437.6 cm. GI AND BOWEL: Appendix normal. The stomach, small bowel, and large bowel are otherwise unremarkable. PERITONEUM AND RETROPERITONEUM: No ascites. No free air. VASCULATURE: Aorta is normal in caliber. Moderate aortoiliac atherosclerotic calcification. LYMPH NODES: No lymphadenopathy. REPRODUCTIVE  ORGANS: Moderate prostatic hypertrophy. BONES AND SOFT TISSUES: No acute osseous abnormality. No focal soft tissue abnormality. RAF score: Aortic atherosclerosis (icd10-i70.0) IMPRESSION: 1. Distal esophageal fluid and circumferential wall thickening, which may reflect esophagitis, similar to prior study; endoscopy may be helpful for further assessment. 2. Severe hepatic steatosis and mild hepatomegaly. 3. Cholelithiasis without pericholecystic  inflammatory change and without biliary ductal dilation. 4. Moderate bladder distention (volume 438 mL). 5. Moderate prostatic hypertrophy. 6. Raf score includes aortic atherosclerosis (ICD10-I70.0). Electronically signed by: Dorethia Molt MD 08/10/2024 12:41 AM EST RP Workstation: [id number]   DG Chest 1 View Result Date: 08/10/2024 EXAM: 1 VIEW(S) XRAY OF THE CHEST 08/10/2024 12:13:00 AM COMPARISON: 08/03/2023 CLINICAL HISTORY: sob FINDINGS: LUNGS AND PLEURA: No focal pulmonary opacity. No pleural effusion. No pneumothorax. HEART AND MEDIASTINUM: No acute abnormality of the cardiac and mediastinal silhouettes. BONES AND SOFT TISSUES: Remote left rib fractures. IMPRESSION: 1. No acute cardiopulmonary abnormality. Electronically signed by: Dorethia Molt MD 08/10/2024 12:16 AM EST RP Workstation: [id number]    EKG: I independently viewed the EKG done and my findings are as followed: Sinus rhythm rate of 89.  QTc 565.  Assessment/Plan Present on Admission:  Hyponatremia  Principal Problem:   Hyponatremia  Severe hyponatremia, exacerbation of chronic hyponatremia likely secondary to beer potomania Presented with serum sodium of 109, not eating, excessive alcohol use Denies headache or dizziness More than 8 mEq correction in a 24-hour window. BMP every 4 hours x 3 Encourage oral intake as tolerated Continue IV fluid hydration  Alcohol abuse with concern for alcohol withdrawal Follow alcohol level CIWA protocol TOC consulted to provide resources for complete alcohol cessation.  Esophagitis seen on CT scan IV PPI, Protonix  40 mg twice daily Recommend GI evaluation for possible endoscopy  Hypokalemia Serum potassium 2.9 Repleted orally and intravenously.  Hypomagnesemia Serum magnesium  1.5 Repleted intravenously  Chronic back pain Osteoarthritis Pain control  Generalized weakness PT OT evaluation Fall precaution.  Severe hepatic steatosis and mild hepatomegaly.   Recommend  complete alcohol cessation Avoid hepatotoxic agents.  Incidental findings seen on CT scan: Cholelithiasis without pericholecystic inflammatory change and without biliary ductal dilatation.    Moderate bladder distention, moderate prostatic hypertrophy. Monitor for urine retention.   Critical care time: 55 minutes.   DVT prophylaxis: Subcu Lovenox  daily.  Code Status: Full code.  Family Communication: Patient's wife at bedside.  Disposition Plan: Admitted to stepdown unit.  Consults called: None.  Admission status: Inpatient status.   Status is: Inpatient The patient requires at least 2 midnights for further evaluation and treatment of present condition.   Terry LOISE Hurst MD [facility] Pager [phone]  If 7PM-7AM, please contact night-coverage [email or web address] Password [email or web address]  08/10/2024, 1:44 AM      [1] No Known Allergies

## 2024-08-10 NOTE — Plan of Care (Signed)

## 2024-08-10 NOTE — ED Notes (Signed)
 Pt urinated in bed- unable to use urinal effectively at this time- male wick placed on pt- all bed sheets changed- new gown placed on pt- warm blanket given.

## 2024-08-10 NOTE — ED Notes (Addendum)
 Pt taken to CT.
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
## 2024-08-10 NOTE — TOC Initial Note (Signed)
 Transition of Care [facility]) - Initial/Assessment Note    Patient Details  Name: Caleb Kim MRN: [id number] Date of Birth: [birth year]  Transition [facility] CM/SW Contact:    Lucie Lunger, LCSWA Phone Number: 08/10/2024, 2:38 PM  Clinical Narrative:                 CSW updated that PT is recommending HH PT for pt at D/C. CSW spoke with pt at bedside to review this, CSW explained that pts insurance has no coverage for [facility] services. CSW explained that OP PT referral can be sent, pt is agreeable to this. CSW sent referral to [facility] OP PT clinic at this time. TOC to follow.   Expected Discharge Plan: OP Rehab Barriers to Discharge: Continued Medical Work up   Patient Goals and CMS Choice Patient states their goals for this hospitalization and ongoing recovery are:: return home CMS Medicare.gov Compare Post Acute Care list provided to:: Patient Choice offered to / list presented to : Patient      Expected Discharge Plan and Services In-house Referral: Clinical Social Work Discharge Planning Services: CM Consult   Living arrangements for the past 2 months: Single Family Home                                      Prior Living Arrangements/Services Living arrangements for the past 2 months: Single Family Home Lives with:: Spouse Patient language and need for interpreter reviewed:: Yes Do you feel safe going back to the place where you live?: Yes      Need for Family Participation in Patient Care: Yes (Comment) Care giver support system in place?: Yes (comment)   Criminal Activity/Legal Involvement Pertinent to Current Situation/Hospitalization: No - Comment as needed  Activities of Daily Living   ADL Screening (condition at time of admission) Independently performs ADLs?: Yes (appropriate for developmental age) Is the patient deaf or have difficulty hearing?: No Does the patient have difficulty seeing, even when wearing glasses/contacts?: No Does the patient have  difficulty concentrating, remembering, or making decisions?: No  Permission Sought/Granted                  Emotional Assessment Appearance:: Appears stated age Attitude/Demeanor/Rapport: Engaged Affect (typically observed): Accepting Orientation: : Oriented to Self, Oriented to Place, Oriented to  Time, Oriented to Situation Alcohol / Substance Use: Not Applicable Psych Involvement: No (comment)  Admission diagnosis:  Hypokalemia [E87.6] Hyponatremia [E87.1] History of ETOH abuse [F10.11] Vomiting without nausea, unspecified vomiting type [R11.11] Patient Active Problem List   Diagnosis Date Noted   Lumbar pain 05/03/2024   Liver fibrosis 09/28/2023   Elevated liver enzymes 09/28/2023   Normocytic anemia 09/28/2023   Colon cancer screening 09/28/2023   Osteoarthritis 08/13/2023   Urinary incontinence 08/13/2023   Chronic cough 08/13/2023   Elevated blood pressure reading 04/12/2023   Left hip pain 12/04/2022   Dehydration 05/15/2022   Alcohol abuse 05/15/2022   Alcohol withdrawal (HCC) 01/02/2020   Hyponatremia 12/31/2019   Hypokalemia 12/31/2019   Alcoholic intoxication without complication 12/31/2019   Tobacco abuse 12/31/2019   Vomiting 12/31/2019   Intractable hiccups 12/31/2019   Anorexia 12/31/2019   Transaminasemia    Closed fracture of right side of body of mandible with routine healing 05/10/2017   Closed fracture of right zygomatic arch (HCC) 05/10/2017   PCP:  Terry Wilhelmena Lloyd Hilario, FNP Pharmacy:   [id number] - [place], [place] - [address] [place] [address] Phone: [phone] Fax: [phone]     Social Drivers of Health (SDOH) Social History: SDOH Screenings   Food Insecurity: No Food Insecurity (08/10/2024)  Housing: Low Risk (08/10/2024)  Transportation Needs: No Transportation Needs (08/10/2024)  Utilities: Not At Risk (08/10/2024)  Depression  (PHQ2-9): Low Risk (06/01/2024)  Social Connections: Unknown (09/03/2022)   Received from [facility]  Tobacco Use: High Risk (08/09/2024)   SDOH Interventions:     Readmission Risk Interventions     No data to display

## 2024-08-10 NOTE — Progress Notes (Signed)
 ASSUMPTION OF CARE NOTE   08/10/2024 2:02 PM  Caleb Kim was seen and examined.  The H&P by the admitting provider, orders, imaging was reviewed.  Please see new orders.  Will continue to follow.   Vitals:   08/10/24 0900 08/10/24 1127  BP: (!) 146/85   Pulse: (!) 130   Resp:    Temp:  97.9 F (36.6 C)  SpO2: 96%       Results for orders placed or performed during the hospital encounter of 08/09/24  Basic metabolic panel   Collection Time: 08/10/24 12:07 AM  Result Value Ref Range   Sodium 109 (LL) 135 - 145 mmol/L   Potassium 2.9 (L) 3.5 - 5.1 mmol/L   Chloride <65 (LL) 98 - 111 mmol/L   CO2 34 (H) 22 - 32 mmol/L   Glucose, Bld 123 (H) 70 - 99 mg/dL   BUN 7 (L) 8 - 23 mg/dL   Creatinine, Ser 9.39 (L) 0.61 - 1.24 mg/dL   Calcium 8.4 (L) 8.9 - 10.3 mg/dL   GFR, Estimated >39 >39 mL/min   Anion gap NOT CALCULATED 5 - 15  CBC with Differential   Collection Time: 08/10/24 12:07 AM  Result Value Ref Range   WBC 8.6 4.0 - 10.5 K/uL   RBC 4.12 (L) 4.22 - 5.81 MIL/uL   Hemoglobin 13.1 13.0 - 17.0 g/dL   HCT 64.8 (L) 60.9 - 47.9 %   MCV 85.2 80.0 - 100.0 fL   MCH 31.8 26.0 - 34.0 pg   MCHC 37.3 (H) 30.0 - 36.0 g/dL   RDW 89.0 (L) 88.4 - 84.4 %   Platelets 129 (L) 150 - 400 K/uL   nRBC 0.0 0.0 - 0.2 %   Neutrophils Relative % 84 %   Neutro Abs 7.1 1.7 - 7.7 K/uL   Lymphocytes Relative 5 %   Lymphs Abs 0.5 (L) 0.7 - 4.0 K/uL   Monocytes Relative 10 %   Monocytes Absolute 0.9 0.1 - 1.0 K/uL   Eosinophils Relative 0 %   Eosinophils Absolute 0.0 0.0 - 0.5 K/uL   Basophils Relative 0 %   Basophils Absolute 0.0 0.0 - 0.1 K/uL   Immature Granulocytes 1 %   Abs Immature Granulocytes 0.06 0.00 - 0.07 K/uL  Magnesium    Collection Time: 08/10/24 12:07 AM  Result Value Ref Range   Magnesium  1.5 (L) 1.7 - 2.4 mg/dL  Ethanol   Collection Time: 08/10/24 12:07 AM  Result Value Ref Range   Alcohol , Ethyl (B) 143 (H) <15 mg/dL  Basic metabolic panel   Collection Time:  08/10/24  1:55 AM  Result Value Ref Range   Sodium 112 (LL) 135 - 145 mmol/L   Potassium 2.9 (L) 3.5 - 5.1 mmol/L   Chloride 67 (L) 98 - 111 mmol/L   CO2 33 (H) 22 - 32 mmol/L   Glucose, Bld 94 70 - 99 mg/dL   BUN 6 (L) 8 - 23 mg/dL   Creatinine, Ser 9.45 (L) 0.61 - 1.24 mg/dL   Calcium 8.4 (L) 8.9 - 10.3 mg/dL   GFR, Estimated >39 >39 mL/min   Anion gap 13 5 - 15  Urinalysis, Routine w reflex microscopic -Urine, Clean Catch   Collection Time: 08/10/24  2:49 AM  Result Value Ref Range   Color, Urine YELLOW YELLOW   APPearance CLEAR CLEAR   Specific Gravity, Urine 1.008 1.005 - 1.030   pH 7.0 5.0 - 8.0   Glucose, UA NEGATIVE NEGATIVE mg/dL   Hgb urine  dipstick NEGATIVE NEGATIVE   Bilirubin Urine NEGATIVE NEGATIVE   Ketones, ur NEGATIVE NEGATIVE mg/dL   Protein, ur NEGATIVE NEGATIVE mg/dL   Nitrite NEGATIVE NEGATIVE   Leukocytes,Ua NEGATIVE NEGATIVE  MRSA Next Gen by PCR, Nasal   Collection Time: 08/10/24  3:02 AM   Specimen: Nasal Mucosa; Nasal Swab  Result Value Ref Range   MRSA by PCR Next Gen NOT DETECTED NOT DETECTED  CBC with Differential/Platelet   Collection Time: 08/10/24  4:49 AM  Result Value Ref Range   WBC 8.9 4.0 - 10.5 K/uL   RBC 3.99 (L) 4.22 - 5.81 MIL/uL   Hemoglobin 12.7 (L) 13.0 - 17.0 g/dL   HCT 65.8 (L) 60.9 - 47.9 %   MCV 85.5 80.0 - 100.0 fL   MCH 31.8 26.0 - 34.0 pg   MCHC 37.2 (H) 30.0 - 36.0 g/dL   RDW 89.0 (L) 88.4 - 84.4 %   Platelets 107 (L) 150 - 400 K/uL   nRBC 0.0 0.0 - 0.2 %   Neutrophils Relative % 81 %   Neutro Abs 7.3 1.7 - 7.7 K/uL   Lymphocytes Relative 7 %   Lymphs Abs 0.6 (L) 0.7 - 4.0 K/uL   Monocytes Relative 11 %   Monocytes Absolute 1.0 0.1 - 1.0 K/uL   Eosinophils Relative 0 %   Eosinophils Absolute 0.0 0.0 - 0.5 K/uL   Basophils Relative 0 %   Basophils Absolute 0.0 0.0 - 0.1 K/uL   Immature Granulocytes 1 %   Abs Immature Granulocytes 0.05 0.00 - 0.07 K/uL  Comprehensive metabolic panel   Collection Time: 08/10/24   4:49 AM  Result Value Ref Range   Sodium 117 (LL) 135 - 145 mmol/L   Potassium 3.1 (L) 3.5 - 5.1 mmol/L   Chloride 73 (L) 98 - 111 mmol/L   CO2 31 22 - 32 mmol/L   Glucose, Bld 82 70 - 99 mg/dL   BUN 6 (L) 8 - 23 mg/dL   Creatinine, Ser 9.40 (L) 0.61 - 1.24 mg/dL   Calcium 8.4 (L) 8.9 - 10.3 mg/dL   Total Protein 7.3 6.5 - 8.1 g/dL   Albumin 4.0 3.5 - 5.0 g/dL   AST 875 (H) 15 - 41 U/L   ALT 70 (H) 0 - 44 U/L   Alkaline Phosphatase 87 38 - 126 U/L   Total Bilirubin 1.1 0.0 - 1.2 mg/dL   GFR, Estimated >39 >39 mL/min   Anion gap 13 5 - 15  Magnesium    Collection Time: 08/10/24  4:49 AM  Result Value Ref Range   Magnesium  2.5 (H) 1.7 - 2.4 mg/dL  Phosphorus   Collection Time: 08/10/24  4:49 AM  Result Value Ref Range   Phosphorus 2.2 (L) 2.5 - 4.6 mg/dL  Sodium   Collection Time: 08/10/24  8:00 AM  Result Value Ref Range   Sodium 118 (LL) 135 - 145 mmol/L  Sodium   Collection Time: 08/10/24 12:31 PM  Result Value Ref Range   Sodium 122 (L) 135 - 145 mmol/L     C. Vicci, MD [facility]   08/09/2024 11:40 PM How to contact the [facility] Attending or Consulting provider 7A - 7P or covering provider during after hours 7P -7A, for this patient?  Check the care team in [facility] and look for a) attending/consulting [facility] provider listed and b) the [facility] team listed Log into [email or web address] and use 's universal password to access. If you do not have the password, please contact the hospital  operator. Locate the [facility] provider you are looking for under [facility] and page to a number that you can be directly reached. If you still have difficulty reaching the provider, please page the [facility] (Director on Call) for the Hospitalists listed on amion for assistance.

## 2024-08-10 NOTE — Plan of Care (Signed)
°  Problem: Education: Goal: Knowledge of General Education information will improve Description: Including pain rating scale, medication(s)/side effects and non-pharmacologic comfort measures Outcome: Progressing   Problem: Clinical Measurements: Goal: Diagnostic test results will improve Outcome: Progressing Goal: Respiratory complications will improve Outcome: Progressing   Problem: Activity: Goal: Risk for activity intolerance will decrease Outcome: Progressing   Problem: Coping: Goal: Level of anxiety will decrease Outcome: Progressing   Problem: Elimination: Goal: Will not experience complications related to bowel motility Outcome: Progressing   Problem: Pain Managment: Goal: General experience of comfort will improve and/or be controlled Outcome: Progressing
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
## 2024-08-11 ENCOUNTER — Encounter (HOSPITAL_COMMUNITY): Payer: Self-pay | Admitting: Internal Medicine

## 2024-08-11 DIAGNOSIS — E860 Dehydration: Secondary | ICD-10-CM | POA: Diagnosis not present

## 2024-08-11 DIAGNOSIS — F1010 Alcohol abuse, uncomplicated: Secondary | ICD-10-CM | POA: Diagnosis not present

## 2024-08-11 DIAGNOSIS — K209 Esophagitis, unspecified without bleeding: Secondary | ICD-10-CM

## 2024-08-11 DIAGNOSIS — K921 Melena: Secondary | ICD-10-CM | POA: Diagnosis not present

## 2024-08-11 DIAGNOSIS — R195 Other fecal abnormalities: Secondary | ICD-10-CM | POA: Diagnosis not present

## 2024-08-11 DIAGNOSIS — R933 Abnormal findings on diagnostic imaging of other parts of digestive tract: Secondary | ICD-10-CM | POA: Diagnosis not present

## 2024-08-11 DIAGNOSIS — K7010 Alcoholic hepatitis without ascites: Secondary | ICD-10-CM | POA: Diagnosis not present

## 2024-08-11 DIAGNOSIS — R748 Abnormal levels of other serum enzymes: Secondary | ICD-10-CM | POA: Diagnosis not present

## 2024-08-11 DIAGNOSIS — F10130 Alcohol abuse with withdrawal, uncomplicated: Secondary | ICD-10-CM

## 2024-08-11 DIAGNOSIS — D62 Acute posthemorrhagic anemia: Secondary | ICD-10-CM | POA: Diagnosis not present

## 2024-08-11 DIAGNOSIS — E876 Hypokalemia: Secondary | ICD-10-CM | POA: Diagnosis not present

## 2024-08-11 DIAGNOSIS — E871 Hypo-osmolality and hyponatremia: Secondary | ICD-10-CM | POA: Diagnosis not present

## 2024-08-11 LAB — BASIC METABOLIC PANEL WITH GFR
Anion gap: 12 (ref 5–15)
BUN: 6 mg/dL — ABNORMAL LOW (ref 8–23)
CO2: 29 mmol/L (ref 22–32)
Calcium: 8.6 mg/dL — ABNORMAL LOW (ref 8.9–10.3)
Chloride: 85 mmol/L — ABNORMAL LOW (ref 98–111)
Creatinine, Ser: 0.64 mg/dL (ref 0.61–1.24)
GFR, Estimated: >60 mL/min
Glucose, Bld: 77 mg/dL (ref 70–99)
Potassium: 3.1 mmol/L — ABNORMAL LOW (ref 3.5–5.1)
Sodium: 126 mmol/L — ABNORMAL LOW (ref 135–145)

## 2024-08-11 LAB — HEPATIC FUNCTION PANEL
ALT: 63 U/L — ABNORMAL HIGH (ref 0–44)
AST: 106 U/L — ABNORMAL HIGH (ref 15–41)
Albumin: 3.8 g/dL (ref 3.5–5.0)
Alkaline Phosphatase: 96 U/L (ref 38–126)
Bilirubin, Direct: 0.7 mg/dL — ABNORMAL HIGH (ref 0.0–0.2)
Indirect Bilirubin: 0.5 mg/dL (ref 0.3–0.9)
Total Bilirubin: 1.3 mg/dL — ABNORMAL HIGH (ref 0.0–1.2)
Total Protein: 7.3 g/dL (ref 6.5–8.1)

## 2024-08-11 LAB — CBC
HCT: 36.3 % — ABNORMAL LOW (ref 39.0–52.0)
Hemoglobin: 12.7 g/dL — ABNORMAL LOW (ref 13.0–17.0)
MCH: 31.3 pg (ref 26.0–34.0)
MCHC: 35 g/dL (ref 30.0–36.0)
MCV: 89.4 fL (ref 80.0–100.0)
Platelets: 103 K/uL — ABNORMAL LOW (ref 150–400)
RBC: 4.06 MIL/uL — ABNORMAL LOW (ref 4.22–5.81)
RDW: 11.2 % — ABNORMAL LOW (ref 11.5–15.5)
WBC: 8.3 K/uL (ref 4.0–10.5)
nRBC: 0 % (ref 0.0–0.2)

## 2024-08-11 LAB — SODIUM
Sodium: 128 mmol/L — ABNORMAL LOW (ref 135–145)
Sodium: 127 mmol/L — ABNORMAL LOW (ref 135–145)

## 2024-08-11 LAB — MAGNESIUM: Magnesium: 2.7 mg/dL — ABNORMAL HIGH (ref 1.7–2.4)

## 2024-08-11 LAB — C DIFFICILE QUICK SCREEN W PCR REFLEX
C Diff antigen: NEGATIVE
C Diff interpretation: NOT DETECTED
C Diff toxin: NEGATIVE

## 2024-08-11 LAB — PROTIME-INR
INR: 1 (ref 0.8–1.2)
Prothrombin Time: 14.1 s (ref 11.4–15.2)

## 2024-08-11 MED ORDER — POTASSIUM CHLORIDE CRYS ER 20 MEQ PO TBCR
40.0000 meq | EXTENDED_RELEASE_TABLET | Freq: Once | ORAL | Status: AC
  Administered 2024-08-11: 40 meq via ORAL
  Filled 2024-08-11: qty 2

## 2024-08-11 MED ORDER — ALUM & MAG HYDROXIDE-SIMETH 200-200-20 MG/5ML PO SUSP: 30.0000 mL | ORAL | Status: DC | PRN

## 2024-08-11 MED ORDER — SPIRITUS FRUMENTI
1.0000 | Freq: Three times a day (TID) | ORAL | Status: DC
  Administered 2024-08-11 – 2024-08-13 (×4): 1 via ORAL
  Filled 2024-08-11 (×6): qty 1

## 2024-08-11 MED ORDER — CHLORDIAZEPOXIDE HCL 25 MG PO CAPS
25.0000 mg | ORAL_CAPSULE | Freq: Three times a day (TID) | ORAL | Status: DC
  Administered 2024-08-13 (×3): 25 mg via ORAL
  Filled 2024-08-11 (×3): qty 1

## 2024-08-11 MED ORDER — CHLORDIAZEPOXIDE HCL 25 MG PO CAPS
50.0000 mg | ORAL_CAPSULE | Freq: Three times a day (TID) | ORAL | Status: AC
  Administered 2024-08-11 – 2024-08-12 (×6): 50 mg via ORAL
  Filled 2024-08-11 (×6): qty 2

## 2024-08-11 MED ORDER — SODIUM CHLORIDE 0.9 % IV SOLN: INTRAVENOUS | Status: DC

## 2024-08-11 MED ORDER — CHLORDIAZEPOXIDE HCL 5 MG PO CAPS: 10.0000 mg | ORAL_CAPSULE | Freq: Three times a day (TID) | ORAL | Status: DC

## 2024-08-11 NOTE — Consult Note (Signed)
 "  Gastroenterology Consult   Referring Provider: No ref. provider found Primary Care Physician:  Terry Wilhelmena Lloyd Hilario, FNP Primary Gastroenterologist:  Muhammad Faizan Ahmed, MD   Patient ID: Caleb Kim; [id number]; Aug 29, 1960   Admit date: 08/09/2024  LOS: 1 day   Date of Consultation: 08/11/2024  Reason for Consultation:  consult states upper GI bleed  History of Present Illness   Caleb Kim is a 63 y.o. year old male with history of significant alcohol  abuse, alcohol  withdrawal, chronic hyponatremia, chronic lumbar pain, osteoarthritis, hiccups, and elevated LFTs being worked up outpatient who presented to ED with 3 days of vomiting and lack of p.o. intake along with generalized weakness. GI consulted given yesterday 12/18 he had a multiple dark BM's and heme occult was performed that was noted to be positive.   Hospital course: Patient's wife reported to admitting provider and EDP that he had been drinking about 12 40 ounce beers daily and starts upon awakening and last all day until he goes to bed.  He presented with 3 days of vomiting and decreased p.o. intake as well as hiccups.  They denied any abdominal pain, hematemesis, melena, or hematochezia.  His last drink was right before coming to the ER.  On presentation he was hypertensive, tremulous, tachycardic, and tachypneic. Labs significant for hyponatremia, hypochloremia, hypokalemia with sodium of 109, chloride less than 65, potassium 2.9.  He was given Compazine  for vomiting, fluid bolus, and potassium replacement.  CT A/P without contrast revealed distal esophageal fluid and circumferential wall thickening likely reflective of esophagitis, severe hepatic steatosis with mild hepatomegaly without splenomegaly, evidence of cholelithiasis with some pericholecystic without inflammatory change and no biliary ductal dilation, and moderate bladder distention.  Thus far he has been receiving potassium repletion, folic acid ,  CIWA protocol, PPI twice daily, IV fluids with sodium, and thiamine .  Consult:  Spoke with nursing, patient received Librium  this morning for elevated CIWA score therefore has been pretty lethargic/drowsy today and has also been intermittently confused and having hallucinations.  Patient not able to hold conversation, very lethargic and appears delirious.  He was able to answer to questions about his name, date of birth, and although did not answer the year correctly he did not give me an answer.  He shook his head no to abdominal pain but otherwise was not very interactive.  Most of what is obtained here in this HPI is through chart review.  No family at bedside.  Unable to reach spouse.  Prior outpatient workup: Seen outpatient by Dr. Cinderella who initiated workup for elevated LFTs and has undergone serologic workup and imaging. Labs in March 2025 with negative viral hepatitis pane, negative AMA and ASMA. Ferritin elevated, TSAT 43%. He ordered fibrosure and hemachromatosis labs which patient did not complete.   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 Authorizing Provider  albuterol  (VENTOLIN   HFA) 108 (90 Base) MCG/ACT inhaler Inhale 2 puffs into the lungs every 6 (six) hours as needed for wheezing or shortness of breath. 12/04/22   Del Wilhelmena Lloyd Sola, FNP  chlordiazePOXIDE  (LIBRIUM ) 25 MG capsule 50mg  PO TID x 2D, then 25-50mg  PO BID X 2D, then 25-50mg  PO QD X 1D 08/03/23   Mesner, Selinda, MD  cyclobenzaprine  (FLEXERIL ) 10 MG tablet Take 1 tablet (10 mg total) by mouth 3 (three) times daily as needed for muscle spasms. 04/12/23   Del Orbe Polanco, Iliana, FNP  meloxicam  (MOBIC ) 7.5 MG tablet Take 1 tablet (7.5 mg total) by mouth daily. 08/13/23   Del Wilhelmena Lloyd Sola, FNP  Nebulizer System All-In-One MISC 1 Device by Does not apply route as directed. 05/22/22   Johnson, Clanford L, MD  traMADol  (ULTRAM ) 50 MG tablet Take 1 tablet (50 mg total) by mouth every 12 (twelve) hours as needed for up to 21 doses. 05/03/24   Del Wilhelmena Lloyd Sola, FNP    Current Facility-Administered Medications  Medication Dose Route Frequency Provider Last Rate Last Admin   0.9 %  sodium chloride  infusion   Intravenous Continuous Vicci, Clanford L, MD 40 mL/hr at 08/11/24 0828 New Bag at 08/11/24 0828   acetaminophen  (TYLENOL ) tablet 500 mg  500 mg Oral Q6H PRN Hall, Carole N, DO       alum & mag hydroxide-simeth (MAALOX/MYLANTA) 200-200-20 MG/5ML suspension 30 mL  30 mL Oral Q4H PRN Johnson, Clanford L, MD       chlordiazePOXIDE  (LIBRIUM ) capsule 50 mg  50 mg Oral TID Vicci, Clanford L, MD   50 mg at 08/11/24 0900   Followed by   NOREEN ON 08/13/2024] chlordiazePOXIDE  (LIBRIUM ) capsule 25 mg  25 mg Oral TID Vicci, Clanford L, MD       Followed by   NOREEN ON 08/15/2024] chlordiazePOXIDE  (LIBRIUM ) capsule 10 mg  10 mg Oral TID Johnson, Clanford L, MD       Chlorhexidine  Gluconate Cloth 2 % PADS 6 each  6 each Topical Daily Shona Terry SAILOR, DO   6 each at 08/11/24 0901   enoxaparin  (LOVENOX ) injection 40 mg  40 mg Subcutaneous Q24H Hall, Carole N, DO   40 mg at 08/11/24 0900   folic acid  (FOLVITE ) tablet 1 mg  1 mg Oral Daily Shona Terry N, DO   1 mg at 08/11/24 0900   LORazepam  (ATIVAN ) tablet 1-4 mg  1-4 mg Oral Q1H PRN Hall, Carole N, DO    2 mg at 08/10/24 2107   Or   LORazepam  (ATIVAN ) injection 1-4 mg  1-4 mg Intravenous Q1H PRN Hall, Carole N, DO   2 mg at 08/11/24 0507   melatonin tablet 6 mg  6 mg Oral QHS PRN Shona Terry SAILOR, DO       multivitamin with minerals tablet 1 tablet  1 tablet Oral Daily Shona Terry N, DO   1 tablet at 08/11/24 0901   pantoprazole  (PROTONIX ) injection 40 mg  40 mg Intravenous BID Hall, Carole N, DO   40 mg at 08/11/24 0900   polyethylene glycol (MIRALAX  / GLYCOLAX ) packet 17 g  17 g Oral Daily PRN Shona Terry N, DO       prochlorperazine  (COMPAZINE ) injection 5 mg  5 mg Intravenous Q6H PRN Hall, Carole N, DO   5 mg at 08/11/24 0247   thiamine  (VITAMIN B1) tablet 100 mg  100 mg Oral Daily Shona Terry N, DO   100 mg at 08/10/24 [phone]  Or   thiamine  (VITAMIN B1) injection 100 mg  100 mg Intravenous Daily Shona Laurence N, DO   100 mg at 08/11/24 0900    Allergies as of 08/09/2024   (No Known Allergies)    History reviewed. No pertinent family history.  Social History   Socioeconomic History   Marital status: Married    Spouse name: Not on file   Number of children: Not on file   Years of education: Not on file   Highest education level: Not on file  Occupational History   Not on file  Tobacco Use   Smoking status: Every Day    Types: Cigars   Smokeless tobacco: Never  Vaping Use   Vaping status: Never Used  Substance and Sexual Activity   Alcohol  use: Yes    Comment: drinks beer daily   Drug use: No   Sexual activity: Not Currently  Other Topics Concern   Not on file  Social History Narrative   Not on file   Social Drivers of Health   Tobacco Use: High Risk (08/09/2024)   Patient History    Smoking Tobacco Use: Every Day    Smokeless Tobacco Use: Never    Passive Exposure: Not on file  Financial Resource Strain: Not on file  Food Insecurity: No Food Insecurity (08/10/2024)   Epic    Worried About Programme Researcher, Broadcasting/film/video in the Last Year: Never true    Ran Out of Food in  the Last Year: Never true  Transportation Needs: No Transportation Needs (08/10/2024)   Epic    Lack of Transportation (Medical): No    Lack of Transportation (Non-Medical): No  Physical Activity: Not on file  Stress: Not on file  Social Connections: Unknown (09/03/2022)   Received from [facility]   Social Network    Social Network: Not on file  Intimate Partner Violence: Not At Risk (08/10/2024)   Epic    Fear of Current or Ex-Partner: No    Emotionally Abused: No    Physically Abused: No    Sexually Abused: No  Depression (PHQ2-9): Low Risk (06/01/2024)   Depression (PHQ2-9)    PHQ-2 Score: 0  Alcohol  Screen: Not on file  Housing: Low Risk (08/10/2024)   Epic    Unable to Pay for Housing in the Last Year: No    Number of Times Moved in the Last Year: 1    Homeless in the Last Year: No  Utilities: Not At Risk (08/10/2024)   Epic    Threatened with loss of utilities: No  Health Literacy: Not on file     Review of Systems   Unable to obtain from the patient given mental status.   Physical Exam   Vital Signs in last 24 hours: Temp:  [97.9 F (36.6 C)-99 F (37.2 C)] 98 F (36.7 C) (12/19 0756) Pulse Rate:  [85-120] 120 (12/19 0444) Resp:  [16-27] 18 (12/19 0444) BP: (113-167)/(70-121) 120/70 (12/19 0831) SpO2:  [96 %-100 %] 99 % (12/19 0444) Last BM Date : 08/10/24  General: Lethargic, thin. Head:  Normocephalic and atraumatic. Eyes:  Sclera clear, no icterus.   Conjunctiva pink. Ears:  Normal auditory acuity. Mouth:  No deformity or lesions, dentition normal. Lungs:  Clear throughout to auscultation.   No wheezes, crackles, or rhonchi. No acute distress. Heart: Tachycardic, sinus rhythm on monitor; no murmurs, clicks, rubs,  or gallops. Abdomen:  Soft, nontender and nondistended. No masses, hepatosplenomegaly or hernias noted. Normal bowel sounds, without guarding, and  without rebound.   Rectal: Deferred Extremities:  Without clubbing or edema. Neurologic:  Lethargic, oriented to self.  Delirious. Skin:  Intact without significant lesions or rashes.  Unable to assess upper extremity hands given bilateral mitts in place. Psych: Lethargic, calm..  Intake/Output from previous day: 12/18 0701 - 12/19 0700 In: 702 [P.O.:240; I.V.:462] Out: 2900 [Urine:2900] Intake/Output this shift: No intake/output data recorded.   Labs/Studies   Recent Labs Recent Labs    08/10/24 0007 08/10/24 0449 08/11/24 0452  WBC 8.6 8.9 8.3  HGB 13.1 12.7* 12.7*  HCT 35.1* 34.1* 36.3*  PLT 129* 107* 103*   BMET Recent Labs    08/10/24 0155 08/10/24 0449 08/10/24 0800 08/10/24 2024 08/10/24 2328 08/11/24 0452  NA 112* 117*   < > 125* 124* 126*  K 2.9* 3.1*  --   --   --  3.1*  CL 67* 73*  --   --   --  85*  CO2 33* 31  --   --   --  29  GLUCOSE 94 82  --   --   --  77  BUN 6* 6*  --   --   --  6*  CREATININE 0.54* 0.59*  --   --   --  0.64  CALCIUM 8.4* 8.4*  --   --   --  8.6*   < > = values in this interval not displayed.   LFT Recent Labs    08/10/24 0449 08/11/24 0904  PROT 7.3 7.3  ALBUMIN 4.0 3.8  AST 124* 106*  ALT 70* 63*  ALKPHOS 87 96  BILITOT 1.1 1.3*  BILIDIR  --  0.7*  IBILI  --  0.5   PT/INR Recent Labs    08/11/24 0904  LABPROT 14.1  INR 1.0   Hepatitis Panel No results for input(s): HEPBSAG, HCVAB, HEPAIGM, HEPBIGM in the last 72 hours. C-Diff No results for input(s): CDIFFTOX in the last 72 hours.  Radiology/Studies CT ABDOMEN PELVIS WO CONTRAST Result Date: 08/10/2024 EXAM: CT ABDOMEN AND PELVIS WITHOUT CONTRAST 08/10/2024 12:25:33 AM TECHNIQUE: CT of the abdomen and pelvis was performed without the administration of intravenous contrast. Multiplanar reformatted images are provided for review. Automated exposure control, iterative reconstruction, and/or weight-based adjustment of the mA/kV was utilized to reduce the radiation dose to as low as reasonably achievable. COMPARISON: Remote prior  examination of 12/31/2019. CLINICAL HISTORY: vomiting FINDINGS: LOWER CHEST: The distal esophagus is fluid filled which may reflect changes of gastroesophageal reflux or esophageal dysmotility. There is superimposed circumferential wall thickening involving the visualized distal esophagus which may reflect changes of esophagitis, such as reflux esophagitis. This appears similar to remote prior examination of 12/31/2019, though has not been assessed on this examination. If indicated, this will be better assessed with endoscopy. LIVER: Severe hepatic steatosis. Mild hepatomegaly. GALLBLADDER AND BILE DUCTS: Macro CT gallstones cholelithiasis without superimposed pericholecystic inflammatory change. No intra or extrahepatic biliary ductal dilation. SPLEEN: No acute abnormality. PANCREAS: No acute abnormality. ADRENAL GLANDS: No acute abnormality. KIDNEYS, URETERS AND BLADDER: No stones in the kidneys or ureters. No hydronephrosis. No perinephric or periureteral stranding. Moderate bladder distention with the bladder volume 437.6 cm. GI AND BOWEL: Appendix normal. The stomach, small bowel, and large bowel are otherwise unremarkable. PERITONEUM AND RETROPERITONEUM: No ascites. No free air. VASCULATURE: Aorta is normal in caliber. Moderate aortoiliac atherosclerotic calcification. LYMPH NODES: No lymphadenopathy. REPRODUCTIVE ORGANS: Moderate prostatic hypertrophy. BONES AND SOFT TISSUES: No acute osseous abnormality. No focal soft tissue abnormality.  RAF score: Aortic atherosclerosis (icd10-i70.0) IMPRESSION: 1. Distal esophageal fluid and circumferential wall thickening, which may reflect esophagitis, similar to prior study; endoscopy may be helpful for further assessment. 2. Severe hepatic steatosis and mild hepatomegaly. 3. Cholelithiasis without pericholecystic inflammatory change and without biliary ductal dilation. 4. Moderate bladder distention (volume 438 mL). 5. Moderate prostatic hypertrophy. 6. Raf score  includes aortic atherosclerosis (ICD10-I70.0). Electronically signed by: Dorethia Molt MD 08/10/2024 12:41 AM EST RP Workstation: [id number]   DG Chest 1 View Result Date: 08/10/2024 EXAM: 1 VIEW(S) XRAY OF THE CHEST 08/10/2024 12:13:00 AM COMPARISON: 08/03/2023 CLINICAL HISTORY: sob FINDINGS: LUNGS AND PLEURA: No focal pulmonary opacity. No pleural effusion. No pneumothorax. HEART AND MEDIASTINUM: No acute abnormality of the cardiac and mediastinal silhouettes. BONES AND SOFT TISSUES: Remote left rib fractures. IMPRESSION: 1. No acute cardiopulmonary abnormality. Electronically signed by: Dorethia Molt MD 08/10/2024 12:16 AM EST RP Workstation: [id number]   Assessment   Caleb Kim is a 63 y.o. year old male with history of significant alcohol  abuse, alcohol  withdrawal, chronic hyponatremia, chronic lumbar pain, osteoarthritis, hiccups, and elevated LFTs being worked up outpatient who presented to ED with 3 days of vomiting and lack of p.o. intake along with generalized weakness. GI consulted given reports of melena and heme positive stool.  Melena, abnormal CT of the esophagus: - reported by nursing dark stool x2 yesterday and POC heme occult + - Hgb stable: 13.1>>12.7 - chronic large volume EtOH use, most likely from alcohol  gastritis, esophagitis, duodenitis. Esophagitis most likely given CT findings of circumferential wall thickening.  Given these findings he does need direct visitation with endoscopy but this can be completed on outpatient basis if he remains stable without any large drops in hemoglobin.  If any point he has significant signs of GI bleeding, hemodynamically unstable, or has drop in hemoglobin to the point where he requires transfusion then we would definitely perform while inpatient.  If he were to need inpatient upper endoscopy he will need to have his sodium increased to at least 130 prior to undergoing procedure. - Keep on PPI BID   Elevated LFTs, alcohol  hepatitis: -  AST 124 >> 106 - ALT 70>>63 - T bili 1.1 >> 1.3 (likely lag time in elevation given chronic etoh use) - DF: 15.6 (using 11 as PT control); DF 6.4 (using 13 as PT control) - known cholelithiasis and sludge but no concern for cholecystitis on recent CT and noting severe hepatic steatosis and mild hepatomegaly - has had workup of fibrosis with low kPa on elastography - elevation most likely from recent heavy etoh use given AST:ALT ratio and reassuringly down trending. Has had outpatient work up. Given elevated ferritin will go ahead and obtain hemachromatosis testing while inpatient given lack of compliance on completing outpatient testing.    Plan / Recommendations   PPI IV BID Trend H/H Complete EtOH cessation Avoid NSAIDs Labs - Hemachromatosis DNA testing, ELF Needs to complete EGD if not performed inpatient and Colonoscopy as outpatient as recommended in March.  Plan for inpatient EGD if ongoing melena and drop in Hgb once sodium 130 or greater otherwise stick with plan for outpatient.     08/11/2024, 10:00 AM  Charmaine Melia, MSN, FNP-BC, AGACNP-BC [facility]   "

## 2024-08-11 NOTE — Progress Notes (Addendum)
 " PROGRESS NOTE   Caleb Kim  FMW:[id number] DOB: [birth year] DOA: 08/09/2024 PCP: Terry Wilhelmena Lloyd Hilario, FNP   Chief Complaint  Patient presents with   Emesis   Level of care: Stepdown  Brief Admission History:  63 y.o. male with medical history significant for alcoholism, history of alcohol withdrawal, chronic hyponatremia, chronic lumbar pain, osteoarthritis, history of hiccups, who presents to the ER from home due to hiccups for the past 3 days associated with vomiting and not eating.  [name] at bedside, the patient drinks at least 12 (40 ounces) beers per day, drinks upon awakening, continues all day long, until he goes to bed.  No reported subjective fevers or chills.  Denies abdominal pain, hematemesis, melena, or hematochezia.  Last alcoholic drink was right before coming to the ER.  Feels generally weak.   In the ER, hypertensive, tremulous, tachycardic, and tachypneic.  Lab studies notable for severe hyponatremia 109, severe hypochloremia less than 65, hypokalemia 2.9.  The patient was given LR bolus 1 L x 1, IV antiemetics Compazine  10 mg x 1, KCl 40 mEq x 1.     A CT abdomen pelvis without contrast was done which revealed distal esophageal fluid and circumferential wall thickening which may reflect esophagitis, severe hepatic steatosis and mild hepatomegaly.  Cholelithiasis without pericholecystic inflammatory change and without biliary ductal dilatation.  Moderate bladder distention, moderate prostatic hypertrophy.  TRH, hospitalist service, was asked to admit. He denies having headache or dizziness.   Assessment and Plan:  Severe hyponatremia, exacerbation of chronic hyponatremia likely secondary to beer potomania Presented with serum sodium of 109, not eating, excessive alcohol use Denies headache or dizziness Na slowly improving now up to 126 Encourage oral intake as tolerated Continue IV fluid hydration   Alcohol abuse with concern for alcohol  withdrawal Follow alcohol level CIWA protocol TOC consulted to provide resources for complete alcohol cessation. Increased librium  dose to 50 mg TID and add spiritus frumenti    Esophagitis seen on CT scan Guaiac positive stool and melena x 2 IV PPI, Protonix  40 mg twice daily Requested GI evaluation for possible endoscopy DC lovenox  and order SCDs for DVT prevention   Hypokalemia Serum potassium 3.1 Repleted orally, monitoring Mg and replete as needed    Hypomagnesemia Repleted intravenously   Chronic back pain Osteoarthritis Pain control   Generalized weakness PT/OT evaluation Fall precaution   Severe hepatic steatosis and mild hepatomegaly.   Recommend complete alcohol cessation Avoid hepatotoxic agents.   Incidental findings seen on CT scan: Cholelithiasis without pericholecystic inflammatory change and without biliary ductal dilatation.     Moderate bladder distention, moderate prostatic hypertrophy. Monitor for urine retention.  DVT prophylaxis: SCDs Code Status:  Family Communication: updated at bedside 12/18 Disposition: TBD    Consultants:  GI Procedures:   Antimicrobials:    Subjective: Pt somnolent but arousable, more confused today, likely starting alcohol withdrawal   Objective: Vitals:   08/11/24 0756 08/11/24 0831 08/11/24 1012 08/11/24 1118  BP:  120/70 114/66   Pulse:   (!) 118   Resp:      Temp: 98 F (36.7 C)   97.8 F (36.6 C)  TempSrc: Axillary   Axillary  SpO2:      Weight:      Height:        Intake/Output Summary (Last 24 hours) at 08/11/2024 1156 Last data filed at 08/11/2024 0631 Gross per 24 hour  Intake 462 ml  Output 2250 ml  Net -FISERV  ml   Filed Weights   08/09/24 2349 08/10/24 0115  Weight: 61.2 kg 54.6 kg   Examination:  General exam: Appears somnolent but arousable.   Respiratory system: Clear to auscultation. Respiratory effort normal. Cardiovascular system: normal S1 & S2 heard. No JVD, murmurs, rubs,  gallops or clicks. No pedal edema. Gastrointestinal system: Abdomen is nondistended, soft and nontender. No organomegaly or masses felt. Normal bowel sounds heard. Central nervous system: somnolent and disoriented. No focal neurological deficits. Extremities: Symmetric 5 x 5 power. Skin: No rashes, lesions or ulcers. Psychiatry: Judgement and insight appear UTD. Mood & affect UTD.   Data Reviewed: I have personally reviewed following labs and imaging studies  CBC: Recent Labs  Lab 08/10/24 0007 08/10/24 0449 08/11/24 0452  WBC 8.6 8.9 8.3  NEUTROABS 7.1 7.3  --   HGB 13.1 12.7* 12.7*  HCT 35.1* 34.1* 36.3*  MCV 85.2 85.5 89.4  PLT 129* 107* 103*    Basic Metabolic Panel: Recent Labs  Lab 08/10/24 0007 08/10/24 0155 08/10/24 0449 08/10/24 0800 08/10/24 1231 08/10/24 1644 08/10/24 2024 08/10/24 2328 08/11/24 0452  NA 109* 112* 117*   < > 122* 126* 125* 124* 126*  K 2.9* 2.9* 3.1*  --   --   --   --   --  3.1*  CL <65* 67* 73*  --   --   --   --   --  85*  CO2 34* 33* 31  --   --   --   --   --  29  GLUCOSE 123* 94 82  --   --   --   --   --  77  BUN 7* 6* 6*  --   --   --   --   --  6*  CREATININE 0.60* 0.54* 0.59*  --   --   --   --   --  0.64  CALCIUM 8.4* 8.4* 8.4*  --   --   --   --   --  8.6*  MG 1.5*  --  2.5*  --   --   --   --   --  2.7*  PHOS  --   --  2.2*  --   --   --   --   --   --    < > = values in this interval not displayed.    CBG: No results for input(s): GLUCAP in the last 168 hours.  Recent Results (from the past 240 hours)  MRSA Next Gen by PCR, Nasal     Status: None   Collection Time: 08/10/24  3:02 AM   Specimen: Nasal Mucosa; Nasal Swab  Result Value Ref Range Status   MRSA by PCR Next Gen NOT DETECTED NOT DETECTED Final    Comment: (NOTE) The GeneXpert MRSA Assay (FDA approved for NASAL specimens only), is one component of a comprehensive MRSA colonization surveillance program. It is not intended to diagnose MRSA infection nor to  guide or monitor treatment for MRSA infections. Test performance is not FDA approved in patients less than 14 years old. Performed at [facility], [address], [place], [place] [address]      Radiology Studies: CT ABDOMEN PELVIS WO CONTRAST Result Date: 08/10/2024 EXAM: CT ABDOMEN AND PELVIS WITHOUT CONTRAST 08/10/2024 12:25:33 AM TECHNIQUE: CT of the abdomen and pelvis was performed without the administration of intravenous contrast. Multiplanar reformatted images are provided for review. Automated exposure control, iterative reconstruction, and/or weight-based  adjustment of the mA/kV was utilized to reduce the radiation dose to as low as reasonably achievable. COMPARISON: Remote prior examination of 12/31/2019. CLINICAL HISTORY: vomiting FINDINGS: LOWER CHEST: The distal esophagus is fluid filled which may reflect changes of gastroesophageal reflux or esophageal dysmotility. There is superimposed circumferential wall thickening involving the visualized distal esophagus which may reflect changes of esophagitis, such as reflux esophagitis. This appears similar to remote prior examination of 12/31/2019, though has not been assessed on this examination. If indicated, this will be better assessed with endoscopy. LIVER: Severe hepatic steatosis. Mild hepatomegaly. GALLBLADDER AND BILE DUCTS: Macro CT gallstones cholelithiasis without superimposed pericholecystic inflammatory change. No intra or extrahepatic biliary ductal dilation. SPLEEN: No acute abnormality. PANCREAS: No acute abnormality. ADRENAL GLANDS: No acute abnormality. KIDNEYS, URETERS AND BLADDER: No stones in the kidneys or ureters. No hydronephrosis. No perinephric or periureteral stranding. Moderate bladder distention with the bladder volume 437.6 cm. GI AND BOWEL: Appendix normal. The stomach, small bowel, and large bowel are otherwise unremarkable. PERITONEUM AND RETROPERITONEUM: No ascites. No free air. VASCULATURE: Aorta is normal in  caliber. Moderate aortoiliac atherosclerotic calcification. LYMPH NODES: No lymphadenopathy. REPRODUCTIVE ORGANS: Moderate prostatic hypertrophy. BONES AND SOFT TISSUES: No acute osseous abnormality. No focal soft tissue abnormality. RAF score: Aortic atherosclerosis (icd10-i70.0) IMPRESSION: 1. Distal esophageal fluid and circumferential wall thickening, which may reflect esophagitis, similar to prior study; endoscopy may be helpful for further assessment. 2. Severe hepatic steatosis and mild hepatomegaly. 3. Cholelithiasis without pericholecystic inflammatory change and without biliary ductal dilation. 4. Moderate bladder distention (volume 438 mL). 5. Moderate prostatic hypertrophy. 6. Raf score includes aortic atherosclerosis (ICD10-I70.0). Electronically signed by: Dorethia Molt MD 08/10/2024 12:41 AM EST RP Workstation: [id number]   DG Chest 1 View Result Date: 08/10/2024 EXAM: 1 VIEW(S) XRAY OF THE CHEST 08/10/2024 12:13:00 AM COMPARISON: 08/03/2023 CLINICAL HISTORY: sob FINDINGS: LUNGS AND PLEURA: No focal pulmonary opacity. No pleural effusion. No pneumothorax. HEART AND MEDIASTINUM: No acute abnormality of the cardiac and mediastinal silhouettes. BONES AND SOFT TISSUES: Remote left rib fractures. IMPRESSION: 1. No acute cardiopulmonary abnormality. Electronically signed by: Dorethia Molt MD 08/10/2024 12:16 AM EST RP Workstation: [id number]   Scheduled Meds:  chlordiazePOXIDE   50 mg Oral TID   Followed by   NOREEN ON 08/13/2024] chlordiazePOXIDE   25 mg Oral TID   Followed by   NOREEN ON 08/15/2024] chlordiazePOXIDE   10 mg Oral TID   Chlorhexidine  Gluconate Cloth  6 each Topical Daily   folic acid   1 mg Oral Daily   multivitamin with minerals  1 tablet Oral Daily   pantoprazole  (PROTONIX ) IV  40 mg Intravenous BID   thiamine   100 mg Oral Daily   Or   thiamine   100 mg Intravenous Daily   Continuous Infusions:  sodium chloride  40 mL/hr at 08/11/24 0828    LOS: 1 day   Time spent: 55  mins  Barbarann Kelly Vicci, MD How to contact the [facility] Attending or Consulting provider 7A - 7P or covering provider during after hours 7P -7A, for this patient?  Check the care team in [facility] and look for a) attending/consulting TRH provider listed and b) the TRH team listed Log into [email or web address] to find provider on call.  Locate the TR[facility] provider you are looking for under [facility] and page to a number that you can be directly reached. If you still have difficulty reaching the provider, please page the [facility] (Director on Call) for the Hospitalists listed on amion for assistance.  08/11/2024, 11:56 AM    "

## 2024-08-12 DIAGNOSIS — K7010 Alcoholic hepatitis without ascites: Secondary | ICD-10-CM

## 2024-08-12 DIAGNOSIS — K209 Esophagitis, unspecified without bleeding: Secondary | ICD-10-CM | POA: Diagnosis not present

## 2024-08-12 DIAGNOSIS — K921 Melena: Secondary | ICD-10-CM

## 2024-08-12 DIAGNOSIS — E860 Dehydration: Secondary | ICD-10-CM | POA: Diagnosis not present

## 2024-08-12 DIAGNOSIS — E876 Hypokalemia: Secondary | ICD-10-CM

## 2024-08-12 DIAGNOSIS — K449 Diaphragmatic hernia without obstruction or gangrene: Secondary | ICD-10-CM | POA: Diagnosis not present

## 2024-08-12 DIAGNOSIS — R933 Abnormal findings on diagnostic imaging of other parts of digestive tract: Secondary | ICD-10-CM

## 2024-08-12 DIAGNOSIS — F10130 Alcohol abuse with withdrawal, uncomplicated: Secondary | ICD-10-CM | POA: Diagnosis not present

## 2024-08-12 DIAGNOSIS — K2950 Unspecified chronic gastritis without bleeding: Secondary | ICD-10-CM | POA: Diagnosis not present

## 2024-08-12 DIAGNOSIS — D62 Acute posthemorrhagic anemia: Secondary | ICD-10-CM | POA: Diagnosis not present

## 2024-08-12 DIAGNOSIS — K31A11 Gastric intestinal metaplasia without dysplasia, involving the antrum: Secondary | ICD-10-CM | POA: Diagnosis not present

## 2024-08-12 DIAGNOSIS — E871 Hypo-osmolality and hyponatremia: Secondary | ICD-10-CM | POA: Diagnosis not present

## 2024-08-12 LAB — CBC
HCT: 35.7 % — ABNORMAL LOW (ref 39.0–52.0)
Hemoglobin: 12.1 g/dL — ABNORMAL LOW (ref 13.0–17.0)
MCH: 31.4 pg (ref 26.0–34.0)
MCHC: 33.9 g/dL (ref 30.0–36.0)
MCV: 92.7 fL (ref 80.0–100.0)
Platelets: 108 K/uL — ABNORMAL LOW (ref 150–400)
RBC: 3.85 MIL/uL — ABNORMAL LOW (ref 4.22–5.81)
RDW: 11.7 % (ref 11.5–15.5)
WBC: 9.8 K/uL (ref 4.0–10.5)
nRBC: 0 % (ref 0.0–0.2)

## 2024-08-12 LAB — GLUCOSE, CAPILLARY
Glucose-Capillary: 68 mg/dL — ABNORMAL LOW (ref 70–99)
Glucose-Capillary: 112 mg/dL — ABNORMAL HIGH (ref 70–99)
Glucose-Capillary: 104 mg/dL — ABNORMAL HIGH (ref 70–99)
Glucose-Capillary: 106 mg/dL — ABNORMAL HIGH (ref 70–99)

## 2024-08-12 LAB — BASIC METABOLIC PANEL WITH GFR
Anion gap: 13 (ref 5–15)
BUN: 7 mg/dL — ABNORMAL LOW (ref 8–23)
CO2: 26 mmol/L (ref 22–32)
Calcium: 8.6 mg/dL — ABNORMAL LOW (ref 8.9–10.3)
Chloride: 89 mmol/L — ABNORMAL LOW (ref 98–111)
Creatinine, Ser: 0.59 mg/dL — ABNORMAL LOW (ref 0.61–1.24)
GFR, Estimated: >60 mL/min
Glucose, Bld: 63 mg/dL — ABNORMAL LOW (ref 70–99)
Potassium: 3.3 mmol/L — ABNORMAL LOW (ref 3.5–5.1)
Sodium: 129 mmol/L — ABNORMAL LOW (ref 135–145)

## 2024-08-12 LAB — MAGNESIUM: Magnesium: 2.3 mg/dL (ref 1.7–2.4)

## 2024-08-12 MED ORDER — SODIUM CHLORIDE 0.9 % IV SOLN: INTRAVENOUS | Status: DC

## 2024-08-12 MED ORDER — DEXTROSE-SODIUM CHLORIDE 5-0.9 % IV SOLN: INTRAVENOUS | Status: DC

## 2024-08-12 NOTE — Plan of Care (Signed)
  Problem: Clinical Measurements: Goal: Ability to maintain clinical measurements within normal limits will improve Outcome: Progressing Goal: Will remain free from infection Outcome: Progressing Goal: Diagnostic test results will improve Outcome: Progressing Goal: Respiratory complications will improve Outcome: Progressing Goal: Cardiovascular complication will be avoided Outcome: Progressing   Problem: Coping: Goal: Level of anxiety will decrease Outcome: Progressing   Problem: Elimination: Goal: Will not experience complications related to bowel motility Outcome: Progressing Goal: Will not experience complications related to urinary retention Outcome: Progressing   Problem: Pain Managment: Goal: General experience of comfort will improve and/or be controlled Outcome: Progressing   Problem: Safety: Goal: Ability to remain free from injury will improve Outcome: Progressing   Problem: Skin Integrity: Goal: Risk for impaired skin integrity will decrease Outcome: Progressing   Problem: Education: Goal: Knowledge of General Education information will improve Description: Including pain rating scale, medication(s)/side effects and non-pharmacologic comfort measures Outcome: Not Progressing   Problem: Health Behavior/Discharge Planning: Goal: Ability to manage health-related needs will improve Outcome: Not Progressing   Problem: Activity: Goal: Risk for activity intolerance will decrease Outcome: Not Progressing   Problem: Nutrition: Goal: Adequate nutrition will be maintained Outcome: Not Progressing

## 2024-08-12 NOTE — Progress Notes (Signed)
 Report given to receiving nurse Renae, RN, no further questions at this time.

## 2024-08-12 NOTE — Progress Notes (Signed)
 Subjective: Patient unable to provide meaningful HPI today due to withdrawal symptoms.  Objective: Vital signs in last 24 hours: Temp:  [98.4 F (36.9 C)-99.2 F (37.3 C)] 99.2 F (37.3 C) (12/20 0739) Pulse Rate:  [57-152] 115 (12/20 0900) Resp:  [18-30] 18 (12/20 1046) BP: (83-162)/(63-87) 106/68 (12/20 1000) SpO2:  [49 %-100 %] 96 % (12/20 0900) Last BM Date : 08/12/24 General:   Lethargic, delirious, thin. Head:  Normocephalic and atraumatic. Eyes:  No icterus, sclera clear. Conjuctiva pink.  Abdomen:  Bowel sounds present, soft, non-tender, non-distended. No HSM or hernias noted. No rebound or guarding. No masses appreciated  Msk:  Symmetrical without gross deformities. Normal posture. Extremities:  Without clubbing or edema. Neurologic:  Alert and  oriented x4;  grossly normal neurologically. Skin:  Warm and dry, intact without significant lesions.  Cervical Nodes:  No significant cervical adenopathy. Psych: Lethargic, delirious.  Intake/Output from previous day: 12/19 0701 - 12/20 0700 In: -  Out: 1100 [Urine:1100] Intake/Output this shift: Total I/O In: 120 [P.O.:120] Out: -   Lab Results: Recent Labs    08/10/24 0449 08/11/24 0452 08/12/24 0308  WBC 8.9 8.3 9.8  HGB 12.7* 12.7* 12.1*  HCT 34.1* 36.3* 35.7*  PLT 107* 103* 108*   BMET Recent Labs    08/10/24 0449 08/10/24 0800 08/11/24 0452 08/11/24 1215 08/11/24 1529 08/12/24 0308  NA 117*   < > 126* 128* 127* 129*  K 3.1*  --  3.1*  --   --  3.3*  CL 73*  --  85*  --   --  89*  CO2 31  --  29  --   --  26  GLUCOSE 82  --  77  --   --  63*  BUN 6*  --  6*  --   --  7*  CREATININE 0.59*  --  0.64  --   --  0.59*  CALCIUM 8.4*  --  8.6*  --   --  8.6*   < > = values in this interval not displayed.   LFT Recent Labs    08/10/24 0449 08/11/24 0904  PROT 7.3 7.3  ALBUMIN 4.0 3.8  AST 124* 106*  ALT 70* 63*  ALKPHOS 87 96  BILITOT 1.1 1.3*  BILIDIR  --  0.7*  IBILI  --  0.5    PT/INR Recent Labs    08/11/24 0904  LABPROT 14.1  INR 1.0   Hepatitis Panel No results for input(s): HEPBSAG, HCVAB, HEPAIGM, HEPBIGM in the last 72 hours.   Studies/Results: No results found.  Assessment/Plan:  1.  Melena, esophagitis-per reports, patient had 2 melena stools 08/10/2024.  Hemoglobin stable.  CT findings of likely esophagitis.  Would continue to monitor for now.  Agree with IV PPI twice daily.  Monitor H&H and transfuse for less than 7 or hemodynamic instability.  Avoid all NSAIDs.  If no further evidence of bleeding or drop in hemoglobin would recommend outpatient EGD as well as colonoscopy in the near future which was previously recommended.  Will continue to monitor.   2.  Alcoholic hepatitis-MDF 15.6, no role for steroids.  Needs complete alcoholic cessation.  Agree with thiamine .  Agree with CIWA protocol.   GI to continue to follow. Carlin POUR. Cindie, D.O. Gastroenterology and Hepatology [facility]   LOS: 2 days    08/12/2024, 11:50 AM

## 2024-08-12 NOTE — Progress Notes (Signed)
 " PROGRESS NOTE   Caleb Kim  FMW:[id number] DOB: 1961/05/21 DOA: 08/09/2024 PCP: Terry Wilhelmena Lloyd Hilario, FNP   Chief Complaint  Patient presents with   Emesis   Level of care: Stepdown  Brief Admission History:  63 y.o. male with medical history significant for alcoholism, history of alcohol  withdrawal, chronic hyponatremia, chronic lumbar pain, osteoarthritis, history of hiccups, who presents to the ER from home due to hiccups for the past 3 days associated with vomiting and not eating.  [name] at bedside, the patient drinks at least 12 (40 ounces) beers per day, drinks upon awakening, continues all day long, until he goes to bed.  No reported subjective fevers or chills.  Denies abdominal pain, hematemesis, melena, or hematochezia.  Last alcoholic drink was right before coming to the ER.  Feels generally weak.   In the ER, hypertensive, tremulous, tachycardic, and tachypneic.  Lab studies notable for severe hyponatremia 109, severe hypochloremia less than 65, hypokalemia 2.9.  The patient was given LR bolus 1 L x 1, IV antiemetics Compazine  10 mg x 1, KCl 40 mEq x 1.     A CT abdomen pelvis without contrast was done which revealed distal esophageal fluid and circumferential wall thickening which may reflect esophagitis, severe hepatic steatosis and mild hepatomegaly.  Cholelithiasis without pericholecystic inflammatory change and without biliary ductal dilatation.  Moderate bladder distention, moderate prostatic hypertrophy.  TRH, hospitalist service, was asked to admit. He denies having headache or dizziness.   Assessment and Plan:  Severe hyponatremia, exacerbation of chronic hyponatremia likely secondary to beer potomania Presented with serum sodium of 109, not eating, excessive alcohol  use Denies headache or dizziness Na slowly improving now up to 129 Encourage oral intake as tolerated Continue IV fluid hydration   Alcohol  abuse with concern for alcohol   withdrawal Follow alcohol  level CIWA protocol TOC consulted to provide resources for complete alcohol  cessation. Increased librium  dose to 50 mg TID and added spiritus frumenti    Esophagitis seen on CT scan Guaiac positive stool and melena x 2 IV PPI, Protonix  40 mg twice daily Requested GI evaluation for possible endoscopy DC lovenox  and order SCDs for DVT prevention   Hypokalemia Serum potassium 3.1 Repleted orally, monitoring Mg and replete as needed    Hypomagnesemia Repleted intravenously   Chronic back pain Osteoarthritis Pain control   Generalized weakness PT/OT evaluation Fall precaution   Severe hepatic steatosis and mild hepatomegaly.   Recommend complete alcohol  cessation Avoid hepatotoxic agents.   Incidental findings seen on CT scan: Cholelithiasis without pericholecystic inflammatory change and without biliary ductal dilatation.     Moderate bladder distention, moderate prostatic hypertrophy. Monitor for urine retention.  DVT prophylaxis: SCDs Code Status:  Family Communication: updated at bedside 12/18 Disposition: TBD    Consultants:  GI Procedures:   Antimicrobials:    Subjective: Somnolent but arousable.   Objective: Vitals:   08/12/24 0800 08/12/24 0900 08/12/24 1000 08/12/24 1046  BP: (!) 162/80 128/79 106/68   Pulse: (!) 111 (!) 115    Resp: (!) 22 18 (!) 26 18  Temp:      TempSrc:      SpO2: 95% 96%    Weight:      Height:        Intake/Output Summary (Last 24 hours) at 08/12/2024 1159 Last data filed at 08/12/2024 0909 Gross per 24 hour  Intake 120 ml  Output 1100 ml  Net -980 ml   Filed Weights   08/09/24 2349 08/10/24  0115  Weight: 61.2 kg 54.6 kg   Examination:  General exam: Appears somnolent but arousable.   Respiratory system: Clear to auscultation. Respiratory effort normal. Cardiovascular system: normal S1 & S2 heard. No JVD, murmurs, rubs, gallops or clicks. No pedal edema. Gastrointestinal system:  Abdomen is nondistended, soft and nontender. No organomegaly or masses felt. Normal bowel sounds heard. Central nervous system: somnolent and disoriented. No focal neurological deficits. Extremities: Symmetric 5 x 5 power. Skin: No rashes, lesions or ulcers. Psychiatry: Judgement and insight appear UTD. Mood & affect UTD.   Data Reviewed: I have personally reviewed following labs and imaging studies  CBC: Recent Labs  Lab 08/10/24 0007 08/10/24 0449 08/11/24 0452 08/12/24 0308  WBC 8.6 8.9 8.3 9.8  NEUTROABS 7.1 7.3  --   --   HGB 13.1 12.7* 12.7* 12.1*  HCT 35.1* 34.1* 36.3* 35.7*  MCV 85.2 85.5 89.4 92.7  PLT 129* 107* 103* 108*    Basic Metabolic Panel: Recent Labs  Lab 08/10/24 0007 08/10/24 0155 08/10/24 0449 08/10/24 0800 08/10/24 2328 08/11/24 0452 08/11/24 1215 08/11/24 1529 08/12/24 0308  NA 109* 112* 117*   < > 124* 126* 128* 127* 129*  K 2.9* 2.9* 3.1*  --   --  3.1*  --   --  3.3*  CL <65* 67* 73*  --   --  85*  --   --  89*  CO2 34* 33* 31  --   --  29  --   --  26  GLUCOSE 123* 94 82  --   --  77  --   --  63*  BUN 7* 6* 6*  --   --  6*  --   --  7*  CREATININE 0.60* 0.54* 0.59*  --   --  0.64  --   --  0.59*  CALCIUM 8.4* 8.4* 8.4*  --   --  8.6*  --   --  8.6*  MG 1.5*  --  2.5*  --   --  2.7*  --   --  2.3  PHOS  --   --  2.2*  --   --   --   --   --   --    < > = values in this interval not displayed.    CBG: Recent Labs  Lab 08/12/24 0857 08/12/24 0952  GLUCAP 68* 112*    Recent Results (from the past 240 hours)  MRSA Next Gen by PCR, Nasal     Status: None   Collection Time: 08/10/24  3:02 AM   Specimen: Nasal Mucosa; Nasal Swab  Result Value Ref Range Status   MRSA by PCR Next Gen NOT DETECTED NOT DETECTED Final    Comment: (NOTE) The GeneXpert MRSA Assay (FDA approved for NASAL specimens only), is one component of a comprehensive MRSA colonization surveillance program. It is not intended to diagnose MRSA infection nor to guide or  monitor treatment for MRSA infections. Test performance is not FDA approved in patients less than 57 years old. Performed at [facility], [address], [place], [place] [address]   C Difficile Quick Screen w PCR reflex     Status: None   Collection Time: 08/11/24 12:56 PM   Specimen: STOOL  Result Value Ref Range Status   C Diff antigen NEGATIVE NEGATIVE Final   C Diff toxin NEGATIVE NEGATIVE Final   C Diff interpretation No C. difficile detected.  Final    Comment: Performed at [place]  [facility], [address] [place], [place] [address]     Radiology Studies: No results found.  Scheduled Meds:  chlordiazePOXIDE   50 mg Oral TID   Followed by   NOREEN ON 08/13/2024] chlordiazePOXIDE   25 mg Oral TID   Followed by   NOREEN ON 08/15/2024] chlordiazePOXIDE   10 mg Oral TID   Chlorhexidine  Gluconate Cloth  6 each Topical Daily   folic acid   1 mg Oral Daily   multivitamin with minerals  1 tablet Oral Daily   pantoprazole  (PROTONIX ) IV  40 mg Intravenous BID   spiritus frumenti  1 each Oral TID PC   thiamine   100 mg Oral Daily   Or   thiamine   100 mg Intravenous Daily   Continuous Infusions:  dextrose  5 % and 0.9 % NaCl      LOS: 2 days   Time spent: 55 mins  Caleb Klaus Vicci, MD How to contact the TRH Attending or Consulting provider 7A - 7P or covering provider during after hours 7P -7A, for this patient?  Check the care team in [facility] and look for a) attending/consulting TRH provider listed and b) the TRH team listed Log into [email or web address] to find provider on call.  Locate the TRH provider you are looking for under [facility] and page to a number that you can be directly reached. If you still have difficulty reaching the provider, please page the [facility] (Director on Call) for the Hospitalists listed on amion for assistance.  08/12/2024, 11:59 AM    "

## 2024-08-12 NOTE — Plan of Care (Signed)

## 2024-08-13 DIAGNOSIS — R748 Abnormal levels of other serum enzymes: Secondary | ICD-10-CM | POA: Diagnosis not present

## 2024-08-13 DIAGNOSIS — F10130 Alcohol abuse with withdrawal, uncomplicated: Secondary | ICD-10-CM | POA: Diagnosis not present

## 2024-08-13 DIAGNOSIS — K921 Melena: Secondary | ICD-10-CM | POA: Diagnosis not present

## 2024-08-13 DIAGNOSIS — E871 Hypo-osmolality and hyponatremia: Secondary | ICD-10-CM | POA: Diagnosis not present

## 2024-08-13 DIAGNOSIS — K7010 Alcoholic hepatitis without ascites: Secondary | ICD-10-CM | POA: Diagnosis not present

## 2024-08-13 DIAGNOSIS — K209 Esophagitis, unspecified without bleeding: Secondary | ICD-10-CM | POA: Diagnosis not present

## 2024-08-13 DIAGNOSIS — D62 Acute posthemorrhagic anemia: Secondary | ICD-10-CM | POA: Diagnosis not present

## 2024-08-13 DIAGNOSIS — F1010 Alcohol abuse, uncomplicated: Secondary | ICD-10-CM | POA: Diagnosis not present

## 2024-08-13 LAB — BASIC METABOLIC PANEL WITH GFR
Anion gap: 9 (ref 5–15)
BUN: 7 mg/dL — ABNORMAL LOW (ref 8–23)
CO2: 29 mmol/L (ref 22–32)
Calcium: 8.4 mg/dL — ABNORMAL LOW (ref 8.9–10.3)
Chloride: 91 mmol/L — ABNORMAL LOW (ref 98–111)
Creatinine, Ser: 0.6 mg/dL — ABNORMAL LOW (ref 0.61–1.24)
GFR, Estimated: >60 mL/min
Glucose, Bld: 111 mg/dL — ABNORMAL HIGH (ref 70–99)
Potassium: 2.8 mmol/L — ABNORMAL LOW (ref 3.5–5.1)
Sodium: 129 mmol/L — ABNORMAL LOW (ref 135–145)

## 2024-08-13 LAB — MAGNESIUM: Magnesium: 2 mg/dL (ref 1.7–2.4)

## 2024-08-13 LAB — GLUCOSE, CAPILLARY
Glucose-Capillary: 118 mg/dL — ABNORMAL HIGH (ref 70–99)
Glucose-Capillary: 115 mg/dL — ABNORMAL HIGH (ref 70–99)
Glucose-Capillary: 129 mg/dL — ABNORMAL HIGH (ref 70–99)
Glucose-Capillary: 131 mg/dL — ABNORMAL HIGH (ref 70–99)
Glucose-Capillary: 117 mg/dL — ABNORMAL HIGH (ref 70–99)

## 2024-08-13 LAB — CBC
HCT: 30.8 % — ABNORMAL LOW (ref 39.0–52.0)
Hemoglobin: 10.6 g/dL — ABNORMAL LOW (ref 13.0–17.0)
MCH: 31.7 pg (ref 26.0–34.0)
MCHC: 34.4 g/dL (ref 30.0–36.0)
MCV: 92.2 fL (ref 80.0–100.0)
Platelets: 139 K/uL — ABNORMAL LOW (ref 150–400)
RBC: 3.34 MIL/uL — ABNORMAL LOW (ref 4.22–5.81)
RDW: 11.6 % (ref 11.5–15.5)
WBC: 8 K/uL (ref 4.0–10.5)
nRBC: 0 % (ref 0.0–0.2)

## 2024-08-13 MED ORDER — POTASSIUM CHLORIDE CRYS ER 20 MEQ PO TBCR
40.0000 meq | EXTENDED_RELEASE_TABLET | Freq: Two times a day (BID) | ORAL | Status: DC
  Administered 2024-08-13 (×2): 40 meq via ORAL
  Filled 2024-08-13: qty 4
  Filled 2024-08-13: qty 2

## 2024-08-13 MED ORDER — DEXTROSE-SODIUM CHLORIDE 5-0.9 % IV SOLN: INTRAVENOUS | Status: AC

## 2024-08-13 MED ORDER — DEXTROSE-SODIUM CHLORIDE 5-0.9 % IV SOLN: INTRAVENOUS | Status: DC

## 2024-08-13 MED ORDER — CHLORPROMAZINE HCL 25 MG PO TABS
25.0000 mg | ORAL_TABLET | Freq: Three times a day (TID) | ORAL | Status: DC | PRN
  Administered 2024-08-13: 25 mg via ORAL
  Filled 2024-08-13 (×5): qty 1

## 2024-08-13 NOTE — Evaluation (Signed)
 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 male with medical history significant for alcoholism, history of alcohol  withdrawal, chronic hyponatremia, chronic lumbar pain, osteoarthritis, history of hiccups, who presents to the ER from home due to hiccups for the past 3 days associated with vomiting and not eating.  [name] at bedside, the patient drinks at least 12 (40 ounces) beers per day, drinks upon awakening, continues all day long, until he goes to bed.  No reported subjective fevers or chills.  Denies abdominal pain, hematemesis, melena, or hematochezia.  Last alcoholic drink was right before coming to the ER.  Feels generally weak. Esophagitis seen on CT scan  Guaiac positive stool and melena x 2  IV PPI, Protonix  40 mg twice daily  GI is currently following. BSE ordered.    Assessment / Plan / Recommendation  Clinical Impression  Clinical swallow evaluation completed at bedside. Pt with hiccups, halitosis, and slow movements, but otherwise [facility] oram motor examination. Pt assessed with ice chips, thin water via cup/straw, puree, and regular textures. Pt without overt signs of symptoms of aspiration and no  observable residuals (prolonged oral transit with solids). Recommend continue diet as ordered and can be advanced to regular per GI when appropriate. No further SLP services indicated at this time, will sign off. Please ensure that Pt is alert and upright for all eating/drinking. SLP Visit Diagnosis: Dysphagia, unspecified (R13.10)    Aspiration Risk  No limitations;Risk for inadequate nutrition/hydration (CIWA)    Diet Recommendation           Other Recommendations Oral Care Recommendations: Oral care BID     Swallow Evaluation Recommendations Recommendations: PO diet PO Diet Recommendation: Dysphagia 3 (Mechanical soft);Regular;Thin liquids (Level 0) Liquid Administration via: Cup;Straw Medication Administration: Whole meds with liquid Supervision: Patient able to self-feed;Intermittent supervision/cueing for swallowing strategies Postural changes: Position pt fully upright for meals;Stay upright 30-60 min after meals Oral care recommendations: Oral care BID (2x/day);Staff/trained caregiver to provide oral care   Assistance Recommended at Discharge    Functional Status Assessment Patient has not had a recent decline in their functional status  Frequency and Duration min 1 x/week  1 week       Prognosis Prognosis for improved oropharyngeal function: Good      Swallow Study   General Date of Onset: 08/09/24 HPI: 63 y.o. male with medical history significant for alcoholism, history of alcohol  withdrawal, chronic hyponatremia, chronic lumbar pain, osteoarthritis, history of hiccups, who presents to the ER from home due to hiccups for the past 3 days associated with vomiting and not eating.  [name] at bedside, the patient drinks at least 12 (40 ounces) beers per day, drinks upon awakening, continues all day long, until he  goes to bed.  No reported subjective fevers or chills.  Denies abdominal pain, hematemesis, melena, or hematochezia.  Last alcoholic drink was right  before coming to the ER.  Feels generally weak. Esophagitis seen on CT scan  Guaiac positive stool and melena x 2  IV PPI, Protonix  40 mg twice daily  GI is currently following. BSE ordered. Type of Study: Bedside Swallow Evaluation Previous Swallow Assessment: N/A Diet Prior to this Study: Dysphagia 3 (mechanical soft);Thin liquids (Level 0) Temperature Spikes Noted: No Respiratory Status: Room air History of Recent Intubation: No Behavior/Cognition: Alert;Cooperative Oral Cavity Assessment: Other (comment) (halitosis) Oral Care Completed by SLP: Yes Oral Cavity - Dentition: Adequate natural dentition;Missing dentition Vision: Functional for self-feeding Self-Feeding Abilities: Needs set up (weakness) Patient Positioning: Upright in bed Baseline Vocal Quality: Low vocal intensity;Normal Volitional Cough: Weak Volitional Swallow: Able to elicit    Oral/Motor/Sensory Function Overall Oral Motor/Sensory Function: Within functional limits   Ice Chips Ice chips: Within functional limits Presentation: Spoon   Thin Liquid Thin Liquid: Within functional limits Presentation: Cup;Self Fed;Straw    Nectar Thick Nectar Thick Liquid: Not tested   Honey Thick Honey Thick Liquid: Not tested   Puree Puree: Within functional limits Presentation: Spoon   Solid     Solid: Impaired Presentation: Spoon Oral Phase Impairments: Other (comment) (delayed oral transit, slow mastication) Oral Phase Functional Implications: Prolonged oral transit     Thank you,  Lamar Candy, CCC-SLP [phone]  Keishana Klinger 08/13/2024,12:08 PM

## 2024-08-13 NOTE — Progress Notes (Signed)
 Subjective: Patient much more alert today, conversational.  Notes issues with acid reflux symptoms for multiple weeks now.  No further melena.  Hemoglobin did drop to 10.6 today.  Objective: Vital signs in last 24 hours: Temp:  [97.9 F (36.6 C)-99.3 F (37.4 C)] 98.3 F (36.8 C) (12/21 0313) Pulse Rate:  [76-106] 90 (12/21 0313) Resp:  [5-29] 16 (12/20 1949) BP: (100-137)/(64-83) 137/80 (12/21 0313) SpO2:  [93 %-99 %] 97 % (12/21 0313) Last BM Date : 08/12/24 General:   Lethargic, delirious, thin. Head:  Normocephalic and atraumatic. Eyes:  No icterus, sclera clear. Conjuctiva pink.  Abdomen:  Bowel sounds present, soft, non-tender, non-distended. No HSM or hernias noted. No rebound or guarding. No masses appreciated  Msk:  Symmetrical without gross deformities. Normal posture. Extremities:  Without clubbing or edema. Neurologic:  Alert and  oriented x4;  grossly normal neurologically. Skin:  Warm and dry, intact without significant lesions.  Cervical Nodes:  No significant cervical adenopathy. Psych: Lethargic, delirious.  Intake/Output from previous day: 12/20 0701 - 12/21 0700 In: 500.3 [P.O.:240; I.V.:260.3] Out: 500 [Urine:500] Intake/Output this shift: Total I/O In: 120 [P.O.:120] Out: -   Lab Results: Recent Labs    08/11/24 0452 08/12/24 0308 08/13/24 0447  WBC 8.3 9.8 8.0  HGB 12.7* 12.1* 10.6*  HCT 36.3* 35.7* 30.8*  PLT 103* 108* 139*   BMET Recent Labs    08/11/24 0452 08/11/24 1215 08/11/24 1529 08/12/24 0308 08/13/24 0447  NA 126*   < > 127* 129* 129*  K 3.1*  --   --  3.3* 2.8*  CL 85*  --   --  89* 91*  CO2 29  --   --  26 29  GLUCOSE 77  --   --  63* 111*  BUN 6*  --   --  7* 7*  CREATININE 0.64  --   --  0.59* 0.60*  CALCIUM 8.6*  --   --  8.6* 8.4*   < > = values in this interval not displayed.   LFT Recent Labs    08/11/24 0904  PROT 7.3  ALBUMIN 3.8  AST 106*  ALT 63*  ALKPHOS 96  BILITOT 1.3*  BILIDIR 0.7*  IBILI 0.5    PT/INR Recent Labs    08/11/24 0904  LABPROT 14.1  INR 1.0   Hepatitis Panel No results for input(s): HEPBSAG, HCVAB, HEPAIGM, HEPBIGM in the last 72 hours.   Studies/Results: No results found.  Assessment/Plan:  1.  Melena, esophagitis-per reports, patient had 2 melena stools 08/10/2024.  Hemoglobin stable.  CT findings of likely esophagitis.  Continue on IV PPI twice daily.  Monitor H&H and transfuse for less than 7 or hemodynamic instability.  Avoid all NSAIDs.   Given drop in hemoglobin, will plan on EGD tomorrow to further for peptic ulcer disease, esophagitis, gastritis, H. Pylori, duodenitis, or other. Will also evaluate for esophageal stricture, Schatzki's ring, esophageal web or other.   The risks including infection, bleed, or perforation as well as benefits, limitations, alternatives and imponderables have been reviewed with the patient. Potential for esophageal dilation, biopsy, etc. have also been reviewed.  Questions have been answered. All parties agreeable.  2.  Alcoholic hepatitis-MDF 15.6, no role for steroids.  Needs complete alcoholic cessation.  Agree with thiamine .  Agree with CIWA protocol.   GI to continue to follow. Carlin POUR. Cindie, D.O. Gastroenterology and Hepatology [facility]   LOS: 3 days    08/13/2024, 12:37 PM

## 2024-08-13 NOTE — H&P (View-Only) (Signed)
 Subjective: Patient much more alert today, conversational.  Notes issues with acid reflux symptoms for multiple weeks now.  No further melena.  Hemoglobin did drop to 10.6 today.  Objective: Vital signs in last 24 hours: Temp:  [97.9 F (36.6 C)-99.3 F (37.4 C)] 98.3 F (36.8 C) (12/21 0313) Pulse Rate:  [76-106] 90 (12/21 0313) Resp:  [5-29] 16 (12/20 1949) BP: (100-137)/(64-83) 137/80 (12/21 0313) SpO2:  [93 %-99 %] 97 % (12/21 0313) Last BM Date : 08/12/24 General:   Lethargic, delirious, thin. Head:  Normocephalic and atraumatic. Eyes:  No icterus, sclera clear. Conjuctiva pink.  Abdomen:  Bowel sounds present, soft, non-tender, non-distended. No HSM or hernias noted. No rebound or guarding. No masses appreciated  Msk:  Symmetrical without gross deformities. Normal posture. Extremities:  Without clubbing or edema. Neurologic:  Alert and  oriented x4;  grossly normal neurologically. Skin:  Warm and dry, intact without significant lesions.  Cervical Nodes:  No significant cervical adenopathy. Psych: Lethargic, delirious.  Intake/Output from previous day: 12/20 0701 - 12/21 0700 In: 500.3 [P.O.:240; I.V.:260.3] Out: 500 [Urine:500] Intake/Output this shift: Total I/O In: 120 [P.O.:120] Out: -   Lab Results: Recent Labs    08/11/24 0452 08/12/24 0308 08/13/24 0447  WBC 8.3 9.8 8.0  HGB 12.7* 12.1* 10.6*  HCT 36.3* 35.7* 30.8*  PLT 103* 108* 139*   BMET Recent Labs    08/11/24 0452 08/11/24 1215 08/11/24 1529 08/12/24 0308 08/13/24 0447  NA 126*   < > 127* 129* 129*  K 3.1*  --   --  3.3* 2.8*  CL 85*  --   --  89* 91*  CO2 29  --   --  26 29  GLUCOSE 77  --   --  63* 111*  BUN 6*  --   --  7* 7*  CREATININE 0.64  --   --  0.59* 0.60*  CALCIUM 8.6*  --   --  8.6* 8.4*   < > = values in this interval not displayed.   LFT Recent Labs    08/11/24 0904  PROT 7.3  ALBUMIN 3.8  AST 106*  ALT 63*  ALKPHOS 96  BILITOT 1.3*  BILIDIR 0.7*  IBILI 0.5    PT/INR Recent Labs    08/11/24 0904  LABPROT 14.1  INR 1.0   Hepatitis Panel No results for input(s): HEPBSAG, HCVAB, HEPAIGM, HEPBIGM in the last 72 hours.   Studies/Results: No results found.  Assessment/Plan:  1.  Melena, esophagitis-per reports, patient had 2 melena stools 08/10/2024.  Hemoglobin stable.  CT findings of likely esophagitis.  Continue on IV PPI twice daily.  Monitor H&H and transfuse for less than 7 or hemodynamic instability.  Avoid all NSAIDs.   Given drop in hemoglobin, will plan on EGD tomorrow to further for peptic ulcer disease, esophagitis, gastritis, H. Pylori, duodenitis, or other. Will also evaluate for esophageal stricture, Schatzki's ring, esophageal web or other.   The risks including infection, bleed, or perforation as well as benefits, limitations, alternatives and imponderables have been reviewed with the patient. Potential for esophageal dilation, biopsy, etc. have also been reviewed.  Questions have been answered. All parties agreeable.  2.  Alcoholic hepatitis-MDF 15.6, no role for steroids.  Needs complete alcoholic cessation.  Agree with thiamine .  Agree with CIWA protocol.   GI to continue to follow. Carlin POUR. Cindie, D.O. Gastroenterology and Hepatology [facility]   LOS: 3 days    08/13/2024, 12:37 PM

## 2024-08-13 NOTE — Plan of Care (Signed)
   Problem: Education: Goal: Knowledge of General Education information will improve Description Including pain rating scale, medication(s)/side effects and non-pharmacologic comfort measures Outcome: Progressing

## 2024-08-13 NOTE — Plan of Care (Signed)
  Problem: Clinical Measurements: Goal: Respiratory complications will improve Outcome: Progressing   Problem: Activity: Goal: Risk for activity intolerance will decrease Outcome: Progressing   Problem: Nutrition: Goal: Adequate nutrition will be maintained Outcome: Progressing   Problem: Coping: Goal: Level of anxiety will decrease Outcome: Progressing   Problem: Elimination: Goal: Will not experience complications related to bowel motility Outcome: Progressing Goal: Will not experience complications related to urinary retention Outcome: Progressing   Problem: Pain Managment: Goal: General experience of comfort will improve and/or be controlled Outcome: Progressing   Problem: Safety: Goal: Ability to remain free from injury will improve Outcome: Progressing   Problem: Skin Integrity: Goal: Risk for impaired skin integrity will decrease Outcome: Progressing

## 2024-08-13 NOTE — Progress Notes (Signed)
 " PROGRESS NOTE   Nivek Powley  FMW:[id number] DOB: 25-Mar-1961 DOA: 08/09/2024 PCP: Terry Wilhelmena Lloyd Hilario, FNP   Chief Complaint  Patient presents with   Emesis   Level of care: Telemetry  Brief Admission History:  63 y.o. male with medical history significant for alcoholism, history of alcohol  withdrawal, chronic hyponatremia, chronic lumbar pain, osteoarthritis, history of hiccups, who presents to the ER from home due to hiccups for the past 3 days associated with vomiting and not eating.  [name] at bedside, the patient drinks at least 12 (40 ounces) beers per day, drinks upon awakening, continues all day long, until he goes to bed.  No reported subjective fevers or chills.  Denies abdominal pain, hematemesis, melena, or hematochezia.  Last alcoholic drink was right before coming to the ER.  Feels generally weak.   In the ER, hypertensive, tremulous, tachycardic, and tachypneic.  Lab studies notable for severe hyponatremia 109, severe hypochloremia less than 65, hypokalemia 2.9.  The patient was given LR bolus 1 L x 1, IV antiemetics Compazine  10 mg x 1, KCl 40 mEq x 1.     A CT abdomen pelvis without contrast was done which revealed distal esophageal fluid and circumferential wall thickening which may reflect esophagitis, severe hepatic steatosis and mild hepatomegaly.  Cholelithiasis without pericholecystic inflammatory change and without biliary ductal dilatation.  Moderate bladder distention, moderate prostatic hypertrophy.  TRH, hospitalist service, was asked to admit. He denies having headache or dizziness.   Assessment and Plan:  Severe hyponatremia, exacerbation of chronic hyponatremia likely secondary to beer potomania Presented with serum sodium of 109, not eating, excessive alcohol  use Denies headache or dizziness Na slowly improving now up to 129 Encourage oral intake as tolerated Continue IV fluid hydration   Alcohol  abuse with concern for alcohol   withdrawal Follow alcohol  level CIWA protocol TOC consulted to provide resources for complete alcohol  cessation. Increased librium  dose to 50 mg TID and added spiritus frumenti Now tapering down librium  and he completed spiritus frumenti    Esophagitis seen on CT scan Guaiac positive stool and melena x 2 IV PPI, Protonix  40 mg twice daily Requested GI evaluation for possible endoscopy DC lovenox  and order SCDs for DVT prevention GI team planning upper endoscopy on 12/22    Hypokalemia Serum potassium 2.8 Repleting orally, monitoring Mg and replete as needed    Hypomagnesemia Repleted intravenously   Chronic back pain Osteoarthritis Pain control   Generalized weakness PT/OT evaluation Fall precaution   Severe hepatic steatosis and mild hepatomegaly.   Recommend complete alcohol  cessation Avoid hepatotoxic agents.   Incidental findings seen on CT scan: Cholelithiasis without pericholecystic inflammatory change and without biliary ductal dilatation.     Moderate bladder distention, moderate prostatic hypertrophy. Monitor for urine retention.  DVT prophylaxis: SCDs Code Status:  Family Communication: updated at bedside 12/18, 12/21 Disposition: TBD    Consultants:  GI Procedures:   Antimicrobials:    Subjective: Somnolent but arousable.   Objective: Vitals:   08/12/24 1701 08/12/24 1949 08/13/24 0018 08/13/24 0313  BP: 125/81 120/83 122/75 137/80  Pulse: 92 99 76 90  Resp: 20 16    Temp: 99.3 F (37.4 C) 98.9 F (37.2 C) 97.9 F (36.6 C) 98.3 F (36.8 C)  TempSrc: Oral Oral Axillary Axillary  SpO2: 93% 97% 96% 97%  Weight:      Height:        Intake/Output Summary (Last 24 hours) at 08/13/2024 1243 Last data filed at 08/13/2024 1021  Gross per 24 hour  Intake 500.33 ml  Output 500 ml  Net 0.33 ml   Filed Weights   08/09/24 2349 08/10/24 0115  Weight: 61.2 kg 54.6 kg   Examination:  General exam: Appears somnolent but arousable.    Respiratory system: Clear to auscultation. Respiratory effort normal. Cardiovascular system: normal S1 & S2 heard. No JVD, murmurs, rubs, gallops or clicks. No pedal edema. Gastrointestinal system: Abdomen is nondistended, soft and nontender. No organomegaly or masses felt. Normal bowel sounds heard. Central nervous system: somnolent and disoriented. No focal neurological deficits. Extremities: Symmetric 5 x 5 power. Skin: No rashes, lesions or ulcers. Psychiatry: Judgement and insight appear UTD. Mood & affect UTD.   Data Reviewed: I have personally reviewed following labs and imaging studies  CBC: Recent Labs  Lab 08/10/24 0007 08/10/24 0449 08/11/24 0452 08/12/24 0308 08/13/24 0447  WBC 8.6 8.9 8.3 9.8 8.0  NEUTROABS 7.1 7.3  --   --   --   HGB 13.1 12.7* 12.7* 12.1* 10.6*  HCT 35.1* 34.1* 36.3* 35.7* 30.8*  MCV 85.2 85.5 89.4 92.7 92.2  PLT 129* 107* 103* 108* 139*    Basic Metabolic Panel: Recent Labs  Lab 08/10/24 0007 08/10/24 0155 08/10/24 0449 08/10/24 0800 08/11/24 0452 08/11/24 1215 08/11/24 1529 08/12/24 0308 08/13/24 0447  NA 109* 112* 117*   < > 126* 128* 127* 129* 129*  K 2.9* 2.9* 3.1*  --  3.1*  --   --  3.3* 2.8*  CL <65* 67* 73*  --  85*  --   --  89* 91*  CO2 34* 33* 31  --  29  --   --  26 29  GLUCOSE 123* 94 82  --  77  --   --  63* 111*  BUN 7* 6* 6*  --  6*  --   --  7* 7*  CREATININE 0.60* 0.54* 0.59*  --  0.64  --   --  0.59* 0.60*  CALCIUM 8.4* 8.4* 8.4*  --  8.6*  --   --  8.6* 8.4*  MG 1.5*  --  2.5*  --  2.7*  --   --  2.3 2.0  PHOS  --   --  2.2*  --   --   --   --   --   --    < > = values in this interval not displayed.    CBG: Recent Labs  Lab 08/12/24 1636 08/12/24 2111 08/13/24 0313 08/13/24 0726 08/13/24 1115  GLUCAP 104* 106* 118* 115* 129*    Recent Results (from the past 240 hours)  MRSA Next Gen by PCR, Nasal     Status: None   Collection Time: 08/10/24  3:02 AM   Specimen: Nasal Mucosa; Nasal Swab  Result  Value Ref Range Status   MRSA by PCR Next Gen NOT DETECTED NOT DETECTED Final    Comment: (NOTE) The GeneXpert MRSA Assay (FDA approved for NASAL specimens only), is one component of a comprehensive MRSA colonization surveillance program. It is not intended to diagnose MRSA infection nor to guide or monitor treatment for MRSA infections. Test performance is not FDA approved in patients less than 66 years old. Performed at [facility], [address], [place], [place] [address]   C Difficile Quick Screen w PCR reflex     Status: None   Collection Time: 08/11/24 12:56 PM   Specimen: STOOL  Result Value Ref Range Status   C Diff antigen NEGATIVE  NEGATIVE Final   C Diff toxin NEGATIVE NEGATIVE Final   C Diff interpretation No C. difficile detected.  Final    Comment: Performed at [facility], [address] [place], [place] [address]     Radiology Studies: No results found.  Scheduled Meds:  chlordiazePOXIDE   25 mg Oral TID   Followed by   NOREEN ON 08/15/2024] chlordiazePOXIDE   10 mg Oral TID   Chlorhexidine  Gluconate Cloth  6 each Topical Daily   folic acid   1 mg Oral Daily   multivitamin with minerals  1 tablet Oral Daily   pantoprazole  (PROTONIX ) IV  40 mg Intravenous BID   potassium chloride   40 mEq Oral BID   spiritus frumenti  1 each Oral TID PC   thiamine   100 mg Oral Daily   Or   thiamine   100 mg Intravenous Daily   Continuous Infusions:  dextrose  5 % and 0.9 % NaCl 40 mL/hr at 08/13/24 0834    LOS: 3 days   Time spent: 55 mins  Yong Wahlquist Vicci, MD How to contact the [facility] Attending or Consulting provider 7A - 7P or covering provider during after hours 7P -7A, for this patient?  Check the care team in [facility] and look for a) attending/consulting TRH provider listed and b) the TRH team listed Log into [email or web address] to find provider on call.  Locate the TR[facility] provider you are looking for under [facility] and page to a number that you can be directly reached. If  you still have difficulty reaching the provider, please page the [facility] (Director on Call) for the Hospitalists listed on amion for assistance.  08/13/2024, 12:43 PM    "

## 2024-08-14 ENCOUNTER — Telehealth: Payer: Self-pay | Admitting: Gastroenterology

## 2024-08-14 ENCOUNTER — Inpatient Hospital Stay (HOSPITAL_COMMUNITY): Payer: MEDICAID | Admitting: Anesthesiology

## 2024-08-14 ENCOUNTER — Encounter (HOSPITAL_COMMUNITY): Payer: Self-pay | Admitting: Internal Medicine

## 2024-08-14 ENCOUNTER — Encounter: Payer: Self-pay | Attending: Family Medicine

## 2024-08-14 DIAGNOSIS — K2970 Gastritis, unspecified, without bleeding: Secondary | ICD-10-CM

## 2024-08-14 DIAGNOSIS — K449 Diaphragmatic hernia without obstruction or gangrene: Secondary | ICD-10-CM

## 2024-08-14 DIAGNOSIS — K31A11 Gastric intestinal metaplasia without dysplasia, involving the antrum: Secondary | ICD-10-CM

## 2024-08-14 DIAGNOSIS — K2950 Unspecified chronic gastritis without bleeding: Secondary | ICD-10-CM | POA: Diagnosis not present

## 2024-08-14 DIAGNOSIS — K209 Esophagitis, unspecified without bleeding: Secondary | ICD-10-CM | POA: Diagnosis not present

## 2024-08-14 DIAGNOSIS — K921 Melena: Secondary | ICD-10-CM | POA: Diagnosis not present

## 2024-08-14 HISTORY — PX: ESOPHAGOGASTRODUODENOSCOPY: SHX5428

## 2024-08-14 LAB — CBC
HCT: 28.9 % — ABNORMAL LOW (ref 39.0–52.0)
Hemoglobin: 10 g/dL — ABNORMAL LOW (ref 13.0–17.0)
MCH: 32.1 pg (ref 26.0–34.0)
MCHC: 34.6 g/dL (ref 30.0–36.0)
MCV: 92.6 fL (ref 80.0–100.0)
Platelets: 192 K/uL (ref 150–400)
RBC: 3.12 MIL/uL — ABNORMAL LOW (ref 4.22–5.81)
RDW: 11.8 % (ref 11.5–15.5)
WBC: 6.1 K/uL (ref 4.0–10.5)
nRBC: 0 % (ref 0.0–0.2)

## 2024-08-14 LAB — BASIC METABOLIC PANEL WITH GFR
Anion gap: 7 (ref 5–15)
BUN: 6 mg/dL — ABNORMAL LOW (ref 8–23)
CO2: 27 mmol/L (ref 22–32)
Calcium: 8.4 mg/dL — ABNORMAL LOW (ref 8.9–10.3)
Chloride: 97 mmol/L — ABNORMAL LOW (ref 98–111)
Creatinine, Ser: 0.61 mg/dL (ref 0.61–1.24)
GFR, Estimated: >60 mL/min
Glucose, Bld: 109 mg/dL — ABNORMAL HIGH (ref 70–99)
Potassium: 3.4 mmol/L — ABNORMAL LOW (ref 3.5–5.1)
Sodium: 131 mmol/L — ABNORMAL LOW (ref 135–145)

## 2024-08-14 LAB — GLUCOSE, CAPILLARY
Glucose-Capillary: 121 mg/dL — ABNORMAL HIGH (ref 70–99)
Glucose-Capillary: 104 mg/dL — ABNORMAL HIGH (ref 70–99)
Glucose-Capillary: 170 mg/dL — ABNORMAL HIGH (ref 70–99)

## 2024-08-14 SURGERY — EGD (ESOPHAGOGASTRODUODENOSCOPY)
Anesthesia: General

## 2024-08-14 MED ORDER — FOLIC ACID 1 MG PO TABS: 1.0000 mg | ORAL_TABLET | Freq: Every day | ORAL | Status: AC

## 2024-08-14 MED ORDER — PROPOFOL 10 MG/ML IV BOLUS
INTRAVENOUS | Status: DC | PRN
  Administered 2024-08-14: 40 mg via INTRAVENOUS
  Administered 2024-08-14: 60 mg via INTRAVENOUS

## 2024-08-14 MED ORDER — LIDOCAINE 2% (20 MG/ML) 5 ML SYRINGE
INTRAMUSCULAR | Status: DC | PRN
  Administered 2024-08-14: 40 mg via INTRAVENOUS

## 2024-08-14 MED ORDER — PANTOPRAZOLE SODIUM 40 MG PO TBEC: 40.0000 mg | DELAYED_RELEASE_TABLET | Freq: Two times a day (BID) | ORAL | 2 refills | Status: AC

## 2024-08-14 MED ORDER — ADULT MULTIVITAMIN W/MINERALS CH: 1.0000 | ORAL_TABLET | Freq: Every day | ORAL | Status: AC

## 2024-08-14 MED ORDER — LACTATED RINGERS IV SOLN: INTRAVENOUS | Status: DC

## 2024-08-14 MED ORDER — VITAMIN B-1 100 MG PO TABS: 100.0000 mg | ORAL_TABLET | Freq: Every day | ORAL | Status: AC

## 2024-08-14 NOTE — Transfer of Care (Signed)
 Immediate Anesthesia Transfer of Care Note  Patient: Caleb Kim  Procedure(s) Performed: EGD (ESOPHAGOGASTRODUODENOSCOPY)  Patient Location: PACU  Anesthesia Type:General  Level of Consciousness: drowsy  Airway & Oxygen Therapy: Patient Spontanous Breathing  Post-op Assessment: Report given to RN and Post -op Vital signs reviewed and stable  Post vital signs: Reviewed and stable  Last Vitals:  Vitals Value Taken Time  BP 71/51   Temp 97.8   Pulse 92   Resp 20 08/14/24 11:22  SpO2 95%   Vitals shown include unfiled device data.  Last Pain:  Vitals:   08/14/24 1110  TempSrc:   PainSc: 0-No pain         Complications: No notable events documented.

## 2024-08-14 NOTE — Op Note (Signed)
 [facility] Patient Name: Caleb Kim Procedure Date: 08/14/2024 10:55 AM MRN: [id number] Date of Birth: 1961-07-23 Attending MD: Carlin POUR. Cindie , [place], [id number] CSN: [id number] Age: 63 Admit Type: Outpatient Procedure:                Upper GI endoscopy Indications:              Acute post hemorrhagic anemia, Melena, Abnormal CT                            of the GI tract Providers:                Carlin POUR. Cindie, DO, Tammy Vaught, RN, Chad                            Wilson, Technician Referring MD:              Medicines:                See the Anesthesia note for documentation of the                            administered medications Complications:            No immediate complications. Estimated Blood Loss:     Estimated blood loss was minimal. Procedure:                Pre-Anesthesia Assessment:                           - The anesthesia plan was to use monitored                            anesthesia care (MAC).                           After obtaining informed consent, the endoscope was                            passed under direct vision. Throughout the                            procedure, the patient's blood pressure, pulse, and                            oxygen saturations were monitored continuously. The                            [id number] ([id number]) Upper was introduced through                            the mouth, and advanced to the second part of                            duodenum. The upper GI endoscopy was accomplished                            without difficulty. The  patient tolerated the                            procedure well. Scope In: 11:15:28 AM Scope Out: 11:19:19 AM Total Procedure Duration: 0 hours 3 minutes 51 seconds  Findings:      LA Grade D (one or more mucosal breaks involving at least 75% of       esophageal circumference) esophagitis with no bleeding was found 27 to       39 cm from the incisors. Biopsies were taken with a cold  forceps for       histology.      Patchy inflammation was found in the entire examined stomach. Biopsies       were taken with a cold forceps for Helicobacter pylori testing.      The duodenal bulb, first portion of the duodenum and second portion of       the duodenum were normal.      A small hiatal hernia was present. Impression:               - LA Grade D esophagitis with no bleeding. Biopsied.                           - Gastritis. Biopsied.                           - Normal duodenal bulb, first portion of the                            duodenum and second portion of the duodenum. Moderate Sedation:      Per Anesthesia Care Recommendation:           - Return patient to hospital ward for ongoing care.                            Okay to DC from GI standpoint.                           - Soft diet.                           - Use Protonix  (pantoprazole ) 40 mg PO BID.                           - Return to GI clinic in 4 weeks.                           - Repeat upper endoscopy in 12 weeks to evaluate                            the response to therapy. Procedure Code(s):        --- Professional ---                           [phone], Esophagogastroduodenoscopy, flexible,                            transoral; with biopsy, single  or multiple Diagnosis Code(s):        --- Professional ---                           K20.90, Esophagitis, unspecified without bleeding                           K29.70, Gastritis, unspecified, without bleeding                           D62, Acute posthemorrhagic anemia                           K92.1, Melena (includes Hematochezia)                           R93.3, Abnormal findings on diagnostic imaging of                            other parts of digestive tract CPT copyright 2022 [employer]. All rights reserved. The codes documented in this report are preliminary and upon coder review may  be revised to meet current compliance  requirements. Carlin POUR. Cindie, DO Carlin POUR. Cindie, DO 08/14/2024 11:24:16 AM This report has been signed electronically. Number of Addenda: 0

## 2024-08-14 NOTE — Interval H&P Note (Signed)
 History and Physical Interval Note:  08/14/2024 10:19 AM  Caleb Kim  has presented today for surgery, with the diagnosis of Melena, acute blood loss anemia.  The various methods of treatment have been discussed with the patient and family. After consideration of risks, benefits and other options for treatment, the patient has consented to  Procedures: EGD (ESOPHAGOGASTRODUODENOSCOPY) (N/A) as a surgical intervention.  The patient's history has been reviewed, patient examined, no change in status, stable for surgery.  I have reviewed the patient's chart and labs.  Questions were answered to the patient's satisfaction.     Carlin MARLA Hasty

## 2024-08-14 NOTE — Plan of Care (Signed)
   Problem: Clinical Measurements: Goal: Will remain free from infection Outcome: Progressing Goal: Diagnostic test results will improve Outcome: Progressing   Problem: Activity: Goal: Risk for activity intolerance will decrease Outcome: Progressing   Problem: Nutrition: Goal: Adequate nutrition will be maintained Outcome: Progressing   Problem: Safety: Goal: Ability to remain free from injury will improve Outcome: Progressing

## 2024-08-14 NOTE — Telephone Encounter (Signed)
 Caleb Kim: needs hospital follow-up in 3-4 weeks. She was seen by Charmaine. Can be with Charmaine or Dr Cinderella.   Thanks!

## 2024-08-14 NOTE — Anesthesia Preprocedure Evaluation (Signed)
"                                    Anesthesia Evaluation  Patient identified by MRN, date of birth, ID band Patient awake    Reviewed: Allergy & Precautions, NPO status , Patient's Chart, lab work & pertinent test results  Airway Mallampati: I  TM Distance: >3 FB Neck ROM: Full    Dental no notable dental hx.    Pulmonary Current Smoker and Patient abstained from smoking.   Pulmonary exam normal        Cardiovascular Normal cardiovascular exam     Neuro/Psych  PSYCHIATRIC DISORDERS         GI/Hepatic ,,,(+) Hepatitis -  Endo/Other    Renal/GU      Musculoskeletal  (+) Arthritis ,    Abdominal Normal abdominal exam  (+)   Peds  Hematology  (+) Blood dyscrasia, anemia   Anesthesia Other Findings   Reproductive/Obstetrics                              Anesthesia Physical Anesthesia Plan  ASA: 3  Anesthesia Plan: General   Post-op Pain Management:    Induction: Intravenous  PONV Risk Score and Plan: Propofol  infusion  Airway Management Planned: Nasal Cannula  Additional Equipment:   Intra-op Plan:   Post-operative Plan:   Informed Consent: I have reviewed the patients History and Physical, chart, labs and discussed the procedure including the risks, benefits and alternatives for the proposed anesthesia with the patient or authorized representative who has indicated his/her understanding and acceptance.     Dental advisory given  Plan Discussed with: Anesthesiologist  Anesthesia Plan Comments:         Anesthesia Quick Evaluation  "

## 2024-08-14 NOTE — Discharge Instructions (Signed)
  PLEASE FOLLOW UP IN [facility] WITH DR Drue Second ON 02/24/23 AS SCHEDULED.   PLEASE FOLLOW UP WITH PRIMARY CARE PROVIDER IN 1 WEEK    IMPORTANT INFORMATION: PAY CLOSE ATTENTION   PHYSICIAN DISCHARGE INSTRUCTIONS  Follow with Primary care provider  Del Newman Nip, Tenna Child, FNP  and other consultants as instructed by your Hospitalist Physician  SEEK MEDICAL CARE OR RETURN TO EMERGENCY ROOM IF SYMPTOMS COME BACK, WORSEN OR NEW PROBLEM DEVELOPS   Please note: You were cared for by a hospitalist during your hospital stay. Every effort will be made to forward records to your primary care provider.  You can request that your primary care provider send for your hospital records if they have not received them.  Once you are discharged, your primary care physician will handle any further medical issues. Please note that NO REFILLS for any discharge medications will be authorized once you are discharged, as it is imperative that you return to your primary care physician (or establish a relationship with a primary care physician if you do not have one) for your post hospital discharge needs so that they can reassess your need for medications and monitor your lab values.  Please get a complete blood count and chemistry panel checked by your Primary MD at your next visit, and again as instructed by your Primary MD.  Get Medicines reviewed and adjusted: Please take all your medications with you for your next visit with your Primary MD  Laboratory/radiological data: Please request your Primary MD to go over all hospital tests and procedure/radiological results at the follow up, please ask your primary care provider to get all Hospital records sent to his/her office.  In some cases, they will be blood work, cultures and biopsy results pending at the time of your discharge. Please request that your primary care provider follow up on these results.  If you are diabetic, please bring your blood sugar readings  with you to your follow up appointment with primary care.    Please call and make your follow up appointments as soon as possible.    Also Note the following: If you experience worsening of your admission symptoms, develop shortness of breath, life threatening emergency, suicidal or homicidal thoughts you must seek medical attention immediately by calling 911 or calling your MD immediately  if symptoms less severe.  You must read complete instructions/literature along with all the possible adverse reactions/side effects for all the Medicines you take and that have been prescribed to you. Take any new Medicines after you have completely understood and accpet all the possible adverse reactions/side effects.   Do not drive when taking Pain medications or sleeping medications (Benzodiazepines)  Do not take more than prescribed Pain, Sleep and Anxiety Medications. It is not advisable to combine anxiety,sleep and pain medications without talking with your primary care practitioner  Special Instructions: If you have smoked or chewed Tobacco  in the last 2 yrs please stop smoking, stop any regular Alcohol  and or any Recreational drug use.  Wear Seat belts while driving.  Do not drive if taking any narcotic, mind altering or controlled substances or recreational drugs or alcohol.

## 2024-08-14 NOTE — Discharge Summary (Signed)
 Physician Discharge Summary  Caleb Kim:[id number] DOB: 1961/03/07 DOA: 08/09/2024  PCP: Terry Wilhelmena Lloyd Hilario, FNP GI: [facility]   Admit date: 08/09/2024 Discharge date: 08/14/2024  Admitted From:  HOME  Disposition:  HOME   Recommendations for Outpatient Follow-up:  Follow up with PCP in 1 weeks Follow up with [facility] in 3-4 weeks  Please obtain BMP/CBC in 1-2 weeks Repeat upper endoscopy in 12 weeks to monitor response to therapy  Please follow up on the following pending results: biopsies from EGD  Home Health: outpatient PT   Discharge Condition: STABLE   CODE STATUS: FULL DIET: soft foods, advance as tolerated    Brief Hospitalization Summary: Please see all hospital notes, images, labs for full details of the hospitalization. Admission provider HPI: 63 y.o. male with medical history significant for alcoholism, history of alcohol  withdrawal, chronic hyponatremia, chronic lumbar pain, osteoarthritis, history of hiccups, who presents to the ER from home due to hiccups for the past 3 days associated with vomiting and not eating.  [name] at bedside, the patient drinks at least 12 (40 ounces) beers per day, drinks upon awakening, continues all day long, until he goes to bed.  No reported subjective fevers or chills.  Denies abdominal pain, hematemesis, melena, or hematochezia.  Last alcoholic drink was right before coming to the ER.  Feels generally weak.   In the ER, hypertensive, tremulous, tachycardic, and tachypneic.  Lab studies notable for severe hyponatremia 109, severe hypochloremia less than 65, hypokalemia 2.9.  The patient was given LR bolus 1 L x 1, IV antiemetics Compazine  10 mg x 1, KCl 40 mEq x 1.     A CT abdomen pelvis without contrast was done which revealed distal esophageal fluid and circumferential wall thickening which may reflect esophagitis, severe hepatic steatosis and mild hepatomegaly.  Cholelithiasis without pericholecystic  inflammatory change and without biliary ductal dilatation.  Moderate bladder distention, moderate prostatic hypertrophy.  TRH, hospitalist service, was asked to admit. He denies having headache or dizziness.  Hospital Course by listed problems   Severe hyponatremia, exacerbation of chronic hyponatremia likely secondary to beer potomania Presented with serum sodium of 109, not eating, excessive alcohol  use Denies headache or dizziness Na slowly improving now up to 134 Encourage oral intake as tolerated Pt responded well to IV fluid hydration   Alcohol  abuse with concern for alcohol  withdrawal Follow alcohol  level CIWA protocol was used  TOC consulted to provide resources for complete alcohol  cessation. Increased librium  dose to 50 mg TID and added spiritus frumenti Now tapering down librium  and he completed spiritus frumenti  He is much better now back to baseline.  PT evaluated him and did not recommend SNF.   After speaking with TOC he agreed to outpatient PT   Esophagitis seen on CT scan Guaiac positive stool and melena x 2 IV PPI, Protonix  40 mg twice daily Requested GI evaluation for possible endoscopy DC lovenox  and order SCDs for DVT prevention GI team planning upper endoscopy on 12/22  EGD with Dr. Cindie on 12/22 with findings of LA grade D esophagitis without bleeding, biopsies taken, Gastritis, normal duodenal bulb -ok to DC today on soft diet, protonix  40 mg BID, return to GI clinic in 4 weeks, repeat EGD in 12 weeks to monitor response to therapy, avoid alcohol     Hypokalemia Serum potassium repleted with Kdur    Hypomagnesemia Repleted intravenously   Chronic back pain Osteoarthritis Pain control   Generalized weakness PT/OT evaluation Fall  precaution   Severe hepatic steatosis and mild hepatomegaly.   Recommend complete alcohol  cessation Avoid hepatotoxic agents.   Incidental findings seen on CT scan: Cholelithiasis without pericholecystic inflammatory  change and without biliary ductal dilatation.     Moderate bladder distention, moderate prostatic hypertrophy. Monitor for urine retention.    Discharge Diagnoses:  Principal Problem:   Hyponatremia Active Problems:   Hypokalemia   Tobacco abuse   Alcohol  withdrawal (HCC)   Dehydration   Alcohol  abuse   Elevated liver enzymes   Melena   Abnormal CT scan, esophagus   Alcoholic hepatitis without ascites (HCC)   ABLA (acute blood loss anemia)   Discharge Instructions: Discharge Instructions     Ambulatory referral to Physical Therapy   Complete by: As directed       Allergies as of 08/14/2024   No Known Allergies      Medication List     STOP taking these medications    chlordiazePOXIDE  25 MG capsule Commonly known as: LIBRIUM    cyclobenzaprine  10 MG tablet Commonly known as: FLEXERIL    meloxicam  7.5 MG tablet Commonly known as: MOBIC    Nebulizer System All-In-One Misc   traMADol  50 MG tablet Commonly known as: ULTRAM        TAKE these medications    albuterol  108 (90 Base) MCG/ACT inhaler Commonly known as: VENTOLIN  HFA Inhale 2 puffs into the lungs every 6 (six) hours as needed for wheezing or shortness of breath.   folic acid  1 MG tablet Commonly known as: FOLVITE  Take 1 tablet (1 mg total) by mouth daily.   multivitamin with minerals Tabs tablet Take 1 tablet by mouth daily.   pantoprazole  40 MG tablet Commonly known as: Protonix  Take 1 tablet (40 mg total) by mouth 2 (two) times daily.   thiamine  100 MG tablet Commonly known as: Vitamin B-1 Take 1 tablet (100 mg total) by mouth daily. Start taking on: August 15, 2024        Follow-up Information     Del Wilhelmena Falter, [place], [place]. Schedule an appointment as soon as possible for a visit in 1 week(s).   Specialty: Family Medicine Why: Hospital Follow Up Contact information: [address] [place] [address] [place] [address] [phone]         [facility]  Gastroenterology at [facility]. Schedule an appointment as soon as possible for a visit in 3 week(s).   Specialty: Gastroenterology Why: Hospital Follow Up Contact information: [address] [place]  [address] [phone]               Allergies[1] Allergies as of 08/14/2024   No Known Allergies      Medication List     STOP taking these medications    chlordiazePOXIDE  25 MG capsule Commonly known as: LIBRIUM    cyclobenzaprine  10 MG tablet Commonly known as: FLEXERIL    meloxicam  7.5 MG tablet Commonly known as: MOBIC    Nebulizer System All-In-One Misc   traMADol  50 MG tablet Commonly known as: ULTRAM        TAKE these medications    albuterol  108 (90 Base) MCG/ACT inhaler Commonly known as: VENTOLIN  HFA Inhale 2 puffs into the lungs every 6 (six) hours as needed for wheezing or shortness of breath.   folic acid  1 MG tablet Commonly known as: FOLVITE  Take 1 tablet (1 mg total) by mouth daily.   multivitamin with minerals Tabs tablet Take 1 tablet by mouth daily.   pantoprazole  40 MG tablet Commonly known as: Protonix   Take 1 tablet (40 mg total) by mouth 2 (two) times daily.   thiamine  100 MG tablet Commonly known as: Vitamin B-1 Take 1 tablet (100 mg total) by mouth daily. Start taking on: August 15, 2024        Procedures/Studies: CT ABDOMEN PELVIS WO CONTRAST Result Date: 08/10/2024 EXAM: CT ABDOMEN AND PELVIS WITHOUT CONTRAST 08/10/2024 12:25:33 AM TECHNIQUE: CT of the abdomen and pelvis was performed without the administration of intravenous contrast. Multiplanar reformatted images are provided for review. Automated exposure control, iterative reconstruction, and/or weight-based adjustment of the mA/kV was utilized to reduce the radiation dose to as low as reasonably achievable. COMPARISON: Remote prior examination of 12/31/2019. CLINICAL HISTORY: vomiting FINDINGS: LOWER CHEST: The distal esophagus is fluid filled  which may reflect changes of gastroesophageal reflux or esophageal dysmotility. There is superimposed circumferential wall thickening involving the visualized distal esophagus which may reflect changes of esophagitis, such as reflux esophagitis. This appears similar to remote prior examination of 12/31/2019, though has not been assessed on this examination. If indicated, this will be better assessed with endoscopy. LIVER: Severe hepatic steatosis. Mild hepatomegaly. GALLBLADDER AND BILE DUCTS: Macro CT gallstones cholelithiasis without superimposed pericholecystic inflammatory change. No intra or extrahepatic biliary ductal dilation. SPLEEN: No acute abnormality. PANCREAS: No acute abnormality. ADRENAL GLANDS: No acute abnormality. KIDNEYS, URETERS AND BLADDER: No stones in the kidneys or ureters. No hydronephrosis. No perinephric or periureteral stranding. Moderate bladder distention with the bladder volume 437.6 cm. GI AND BOWEL: Appendix normal. The stomach, small bowel, and large bowel are otherwise unremarkable. PERITONEUM AND RETROPERITONEUM: No ascites. No free air. VASCULATURE: Aorta is normal in caliber. Moderate aortoiliac atherosclerotic calcification. LYMPH NODES: No lymphadenopathy. REPRODUCTIVE ORGANS: Moderate prostatic hypertrophy. BONES AND SOFT TISSUES: No acute osseous abnormality. No focal soft tissue abnormality. RAF score: Aortic atherosclerosis (icd10-i70.0) IMPRESSION: 1. Distal esophageal fluid and circumferential wall thickening, which may reflect esophagitis, similar to prior study; endoscopy may be helpful for further assessment. 2. Severe hepatic steatosis and mild hepatomegaly. 3. Cholelithiasis without pericholecystic inflammatory change and without biliary ductal dilation. 4. Moderate bladder distention (volume 438 mL). 5. Moderate prostatic hypertrophy. 6. Raf score includes aortic atherosclerosis (ICD10-I70.0). Electronically signed by: Dorethia Molt MD 08/10/2024 12:41 AM EST RP  Workstation: [id number]   DG Chest 1 View Result Date: 08/10/2024 EXAM: 1 VIEW(S) XRAY OF THE CHEST 08/10/2024 12:13:00 AM COMPARISON: 08/03/2023 CLINICAL HISTORY: sob FINDINGS: LUNGS AND PLEURA: No focal pulmonary opacity. No pleural effusion. No pneumothorax. HEART AND MEDIASTINUM: No acute abnormality of the cardiac and mediastinal silhouettes. BONES AND SOFT TISSUES: Remote left rib fractures. IMPRESSION: 1. No acute cardiopulmonary abnormality. Electronically signed by: Dorethia Molt MD 08/10/2024 12:16 AM EST RP Workstation: [id number]     Subjective: Pt reports he is tolerating soft diet well.   Discharge Exam: Vitals:   08/14/24 1200 08/14/24 1357  BP: 133/87 122/70  Pulse: 97 83  Resp: 16   Temp: 99 F (37.2 C) 98.1 F (36.7 C)  SpO2: 98% 96%   Vitals:   08/14/24 1145 08/14/24 1153 08/14/24 1200 08/14/24 1357  BP: (!) 87/63  133/87 122/70  Pulse: 79 80 97 83  Resp: (!) 23 20 16    Temp:   99 F (37.2 C) 98.1 F (36.7 C)  TempSrc:    Axillary  SpO2: 97% 97% 98% 96%  Weight:      Height:       General: Pt is alert, awake, not in acute distress Cardiovascular: normal S1/S2 +, no rubs, no gallops  Respiratory: CTA bilaterally, no wheezing, no rhonchi Abdominal: Soft, NT, ND, bowel sounds + Extremities: no edema, no cyanosis   The results of significant diagnostics from this hospitalization (including imaging, microbiology, ancillary and laboratory) are listed below for reference.     Microbiology: Recent Results (from the past 240 hours)  MRSA Next Gen by PCR, Nasal     Status: None   Collection Time: 08/10/24  3:02 AM   Specimen: Nasal Mucosa; Nasal Swab  Result Value Ref Range Status   MRSA by PCR Next Gen NOT DETECTED NOT DETECTED Final    Comment: (NOTE) The GeneXpert MRSA Assay (FDA approved for NASAL specimens only), is one component of a comprehensive MRSA colonization surveillance program. It is not intended to diagnose MRSA infection nor to guide or  monitor treatment for MRSA infections. Test performance is not FDA approved in patients less than 56 years old. Performed at [facility], [address] [place], [place] [address]   C Difficile Quick Screen w PCR reflex     Status: None   Collection Time: 08/11/24 12:56 PM   Specimen: STOOL  Result Value Ref Range Status   C Diff antigen NEGATIVE NEGATIVE Final   C Diff toxin NEGATIVE NEGATIVE Final   C Diff interpretation No C. difficile detected.  Final    Comment: Performed at [facility], [address] [place], [place] [address]  Culture, blood (Routine X 2) w Reflex to ID Panel     Status: None (Preliminary result)   Collection Time: 08/14/24 12:45 AM   Specimen: BLOOD LEFT FOREARM  Result Value Ref Range Status   Specimen Description BLOOD LEFT FOREARM  Final   Special Requests   Final    BOTTLES DRAWN AEROBIC AND ANAEROBIC Blood Culture adequate volume   Culture   Final    NO GROWTH < 12 HOURS Performed at [facility], [address] [place], [place] [address]    Report Status PENDING  Incomplete  Culture, blood (Routine X 2) w Reflex to ID Panel     Status: None (Preliminary result)   Collection Time: 08/14/24 12:52 AM   Specimen: BLOOD LEFT HAND  Result Value Ref Range Status   Specimen Description BLOOD LEFT HAND  Final   Special Requests   Final    BOTTLES DRAWN AEROBIC AND ANAEROBIC Blood Culture adequate volume   Culture   Final    NO GROWTH < 12 HOURS Performed at [facility], [address], [place], [place] [address]    Report Status PENDING  Incomplete     Labs: BNP (last 3 results) No results for input(s): BNP in the last 8760 hours. Basic Metabolic Panel: Recent Labs  Lab 08/10/24 0007 08/10/24 0155 08/10/24 0449 08/10/24 0800 08/11/24 0452 08/11/24 1215 08/11/24 1529 08/12/24 0308 08/13/24 0447 08/14/24 0502  NA 109*   < > 117*   < > 126* 128* 127* 129* 129* 131*  K 2.9*   < > 3.1*  --  3.1*  --   --  3.3* 2.8* 3.4*  CL <65*   < > 73*   --  85*  --   --  89* 91* 97*  CO2 34*   < > 31  --  29  --   --  26 29 27   GLUCOSE 123*   < > 82  --  77  --   --  63* 111* 109*  BUN 7*   < > 6*  --  6*  --   --  7* 7* 6*  CREATININE 0.60*   < > 0.59*  --  0.64  --   --  0.59* 0.60* 0.61  CALCIUM 8.4*   < > 8.4*  --  8.6*  --   --  8.6* 8.4* 8.4*  MG 1.5*  --  2.5*  --  2.7*  --   --  2.3 2.0  --   PHOS  --   --  2.2*  --   --   --   --   --   --   --    < > = values in this interval not displayed.   Liver Function Tests: Recent Labs  Lab 08/10/24 0449 08/11/24 0904  AST 124* 106*  ALT 70* 63*  ALKPHOS 87 96  BILITOT 1.1 1.3*  PROT 7.3 7.3  ALBUMIN 4.0 3.8   No results for input(s): LIPASE, AMYLASE in the last 168 hours. No results for input(s): AMMONIA in the last 168 hours. CBC: Recent Labs  Lab 08/10/24 0007 08/10/24 0449 08/11/24 0452 08/12/24 0308 08/13/24 0447 08/14/24 0502  WBC 8.6 8.9 8.3 9.8 8.0 6.1  NEUTROABS 7.1 7.3  --   --   --   --   HGB 13.1 12.7* 12.7* 12.1* 10.6* 10.0*  HCT 35.1* 34.1* 36.3* 35.7* 30.8* 28.9*  MCV 85.2 85.5 89.4 92.7 92.2 92.6  PLT 129* 107* 103* 108* 139* 192   Cardiac Enzymes: No results for input(s): CKTOTAL, CKMB, CKMBINDEX, TROPONINI in the last 168 hours. BNP: Invalid input(s): POCBNP CBG: Recent Labs  Lab 08/13/24 1115 08/13/24 1641 08/13/24 1955 08/14/24 0328 08/14/24 0714  GLUCAP 129* 131* 117* 121* 104*   D-Dimer No results for input(s): DDIMER in the last 72 hours. Hgb A1c No results for input(s): HGBA1C in the last 72 hours. Lipid Profile No results for input(s): CHOL, HDL, LDLCALC, TRIG, CHOLHDL, LDLDIRECT in the last 72 hours. Thyroid function studies No results for input(s): TSH, T4TOTAL, T3FREE, THYROIDAB in the last 72 hours.  Invalid input(s): FREET3 Anemia work up No results for input(s): VITAMINB12, FOLATE, FERRITIN, TIBC, IRON, RETICCTPCT in the last 72 hours. Urinalysis    Component Value  Date/Time   COLORURINE YELLOW 08/10/2024 0249   APPEARANCEUR CLEAR 08/10/2024 0249   LABSPEC 1.008 08/10/2024 0249   PHURINE 7.0 08/10/2024 0249   GLUCOSEU NEGATIVE 08/10/2024 0249   HGBUR NEGATIVE 08/10/2024 0249   BILIRUBINUR NEGATIVE 08/10/2024 0249   KETONESUR NEGATIVE 08/10/2024 0249   PROTEINUR NEGATIVE 08/10/2024 0249   NITRITE NEGATIVE 08/10/2024 0249   LEUKOCYTESUR NEGATIVE 08/10/2024 0249   Sepsis Labs Recent Labs  Lab 08/11/24 0452 08/12/24 0308 08/13/24 0447 08/14/24 0502  WBC 8.3 9.8 8.0 6.1   Microbiology Recent Results (from the past 240 hours)  MRSA Next Gen by PCR, Nasal     Status: None   Collection Time: 08/10/24  3:02 AM   Specimen: Nasal Mucosa; Nasal Swab  Result Value Ref Range Status   MRSA by PCR Next Gen NOT DETECTED NOT DETECTED Final    Comment: (NOTE) The GeneXpert MRSA Assay (FDA approved for NASAL specimens only), is one component of a comprehensive MRSA colonization surveillance program. It is not intended to diagnose MRSA infection nor to guide or monitor treatment for MRSA infections. Test performance is not FDA approved in patients less than 56 years old. Performed at [facility], [address] [place], [place] [address]   C Difficile Quick Screen w PCR reflex     Status: None   Collection  Time: 08/11/24 12:56 PM   Specimen: STOOL  Result Value Ref Range Status   C Diff antigen NEGATIVE NEGATIVE Final   C Diff toxin NEGATIVE NEGATIVE Final   C Diff interpretation No C. difficile detected.  Final    Comment: Performed at [facility], [address] [place], [place] [address]  Culture, blood (Routine X 2) w Reflex to ID Panel     Status: None (Preliminary result)   Collection Time: 08/14/24 12:45 AM   Specimen: BLOOD LEFT FOREARM  Result Value Ref Range Status   Specimen Description BLOOD LEFT FOREARM  Final   Special Requests   Final    BOTTLES DRAWN AEROBIC AND ANAEROBIC Blood Culture adequate volume   Culture   Final    NO  GROWTH < 12 HOURS Performed at [facility], [address] [place], [place] [address]    Report Status PENDING  Incomplete  Culture, blood (Routine X 2) w Reflex to ID Panel     Status: None (Preliminary result)   Collection Time: 08/14/24 12:52 AM   Specimen: BLOOD LEFT HAND  Result Value Ref Range Status   Specimen Description BLOOD LEFT HAND  Final   Special Requests   Final    BOTTLES DRAWN AEROBIC AND ANAEROBIC Blood Culture adequate volume   Culture   Final    NO GROWTH < 12 HOURS Performed at [facility], [address] [place], [place] [address]    Report Status PENDING  Incomplete   Time coordinating discharge: 32 mins   SIGNED:  Afton Louder, MD  [facility] 08/14/2024, 2:34 PM How to contact the [facility] Attending or Consulting provider 7A - 7P or covering provider during after hours 7P -7A, for this patient?  Check the care team in [facility] and look for a) attending/consulting TRH provider listed and b) the TRH team listed Log into [email or web address] and use [facility]'s universal password to access. If you do not have the password, please contact the hospital operator. Locate the TRH provider you are looking for under [facility] and page to a number that you can be directly reached. If you still have difficulty reaching the provider, please page the [facility] (Director on Call) for the Hospitalists listed on amion for assistance.     [1] No Known Allergies

## 2024-08-14 NOTE — Progress Notes (Signed)
 Discharge instructions reviewed with patient and patient's family. Both verbalized understanding of instructions and follow care. Patient discharged home with family in stable condition.

## 2024-08-15 ENCOUNTER — Encounter (HOSPITAL_COMMUNITY): Payer: Self-pay | Admitting: Internal Medicine

## 2024-08-15 ENCOUNTER — Telehealth: Payer: Self-pay

## 2024-08-15 LAB — NASH FIBROSURE
ALPHA 2-MACROGLOBULINS, QN: 293 mg/dL — ABNORMAL HIGH (ref 110–276)
ALT (SGPT) P5P: 96 IU/L — ABNORMAL HIGH (ref 0–55)
AST (SGOT) P5P: 222 IU/L — ABNORMAL HIGH (ref 0–40)
Apolipoprotein A-1: 232 mg/dL — ABNORMAL HIGH (ref 101–178)
Bilirubin, Total: 0.5 mg/dL (ref 0.0–1.2)
Cholesterol, Total: 261 mg/dL — ABNORMAL HIGH (ref 100–199)
Fibrosis Score: 0.39 — ABNORMAL HIGH (ref 0.00–0.21)
GGT: 663 IU/L — ABNORMAL HIGH (ref 0–65)
Glucose: 134 mg/dL — ABNORMAL HIGH (ref 70–99)
Haptoglobin: 368 mg/dL — ABNORMAL HIGH (ref 32–363)
Height: 66
NASH Score: 0.97 — ABNORMAL HIGH (ref 0.00–0.25)
Steatosis Score: 0.76 — ABNORMAL HIGH (ref 0.00–0.40)
Triglycerides: 204 mg/dL — ABNORMAL HIGH (ref 0–149)
Weight: 120

## 2024-08-15 NOTE — Transitions of Care (Post Inpatient/ED Visit) (Signed)
" ° °  08/15/2024  Name: Caleb Kim MRN: [id number] DOB: 1961-03-07  Today's TOC FU Call Status: Today's TOC FU Call Status:: Unsuccessful Call (1st Attempt) Unsuccessful Call (1st Attempt) Date: 08/15/24  Attempted to reach the patient regarding the most recent Inpatient/ED visit.  Follow Up Plan: Additional outreach attempts will be made to reach the patient to complete the Transitions of Care (Post Inpatient/ED visit) call.   Signature Julian Lemmings, LPN [facility] Nurse Health Advisor Direct Dial [phone]  "

## 2024-08-16 LAB — SURGICAL PATHOLOGY

## 2024-08-16 LAB — HEMOCHROMATOSIS DNA-PCR(C282Y,H63D)

## 2024-08-18 NOTE — Anesthesia Postprocedure Evaluation (Signed)
"   Anesthesia Post Note  Patient: Caleb Kim  Procedure(s) Performed: EGD (ESOPHAGOGASTRODUODENOSCOPY)  Patient location during evaluation: Phase II Anesthesia Type: General Level of consciousness: awake Pain management: pain level controlled Vital Signs Assessment: post-procedure vital signs reviewed and stable Respiratory status: spontaneous breathing and respiratory function stable Cardiovascular status: blood pressure returned to baseline and stable Postop Assessment: no headache and no apparent nausea or vomiting Anesthetic complications: no Comments: Late entry   No notable events documented.   Last Vitals:  Vitals:   08/14/24 1200 08/14/24 1357  BP: 133/87 122/70  Pulse: 97 83  Resp: 16   Temp: 37.2 C 36.7 C  SpO2: 98% 96%    Last Pain:  Vitals:   08/14/24 1357  TempSrc: Axillary  PainSc:                  Yvonna PARAS Janssen Zee      "

## 2024-08-20 LAB — CULTURE, BLOOD (ROUTINE X 2)
Culture: NO GROWTH
Culture: NO GROWTH
Special Requests: ADEQUATE
Special Requests: ADEQUATE

## 2024-08-21 NOTE — Transitions of Care (Post Inpatient/ED Visit) (Signed)
" ° °  08/21/2024  Name: Caleb Kim MRN: [id number] DOB: [birth year]  Today's TOC FU Call Status: Today's TOC FU Call Status:: Successful TOC FU Call Completed Unsuccessful Call (1st Attempt) Date: 08/15/24 [facility] FU Call Complete Date: 08/21/24  Patient's Name and Date of Birth confirmed. DOB  Transition Care Management Follow-up Telephone Call Date of Discharge: 08/14/24 [facility] Type of Discharge: Inpatient Admission Primary Inpatient Discharge Diagnosis:: alcohol  use How have you been since you were released from the hospital?: Better Any questions or concerns?: No  Items Reviewed: Did you receive and understand the discharge instructions provided?: Yes Medications obtained,verified, and reconciled?: Yes (Medications Reviewed) Any new allergies since your discharge?: No Dietary orders reviewed?: Yes Do you have support at home?: Yes People in Home [RPT]: parent(s)  Medications Reviewed Today: Medications Reviewed Today     Reviewed by Emmitt Pan, LPN (Licensed Practical Nurse) on 08/21/24 at 1309  Med List Status: <None>   Medication Order Taking? Sig Documenting Provider Last Dose Status Informant  albuterol  (VENTOLIN  HFA) 108 (90 Base) MCG/ACT inhaler [id number] Yes Inhale 2 puffs into the lungs every 6 (six) hours as needed for wheezing or shortness of breath. Del Wilhelmena Falter, Hilario, FNP  Active Self  folic acid  (FOLVITE ) 1 MG tablet [id number] Yes Take 1 tablet (1 mg total) by mouth daily. Vicci Afton CROME, MD  Active   Multiple Vitamin (MULTIVITAMIN WITH MINERALS) TABS tablet [id number] Yes Take 1 tablet by mouth daily. Vicci Afton CROME, MD  Active   pantoprazole  (PROTONIX ) 40 MG tablet [id number] Yes Take 1 tablet (40 mg total) by mouth 2 (two) times daily. Johnson, Clanford L, MD  Active   thiamine  (VITAMIN B-1) 100 MG tablet [id number] Yes Take 1 tablet (100 mg total) by mouth daily. Vicci Afton CROME, MD  Active   Med List Note  Simona, Shona 10/20/19 2137): No pharmacy preference            Home Care and Equipment/Supplies: Were Home Health Services Ordered?: NA Any new equipment or medical supplies ordered?: NA  Functional Questionnaire: Do you need assistance with bathing/showering or dressing?: No Do you need assistance with meal preparation?: No Do you need assistance with eating?: No Do you have difficulty maintaining continence: No Do you need assistance with getting out of bed/getting out of a chair/moving?: No Do you have difficulty managing or taking your medications?: No  Follow up appointments reviewed: PCP Follow-up appointment confirmed?: Yes Date of PCP follow-up appointment?: 08/22/24 Follow-up Provider: [facility] Follow-up appointment confirmed?: No Reason Specialist Follow-Up Not Confirmed: Patient has Specialist Provider Number and will Call for Appointment Do you need transportation to your follow-up appointment?: No Do you understand care options if your condition(s) worsen?: Yes-patient verbalized understanding    SIGNATURE Pan Emmitt, LPN [facility] Nurse Health Advisor Direct Dial [phone]  "

## 2024-08-22 ENCOUNTER — Ambulatory Visit: Payer: MEDICAID

## 2024-08-22 VITALS — BP 114/69 | HR 85 | Ht 66.0 in | Wt 135.0 lb

## 2024-08-22 DIAGNOSIS — F17200 Nicotine dependence, unspecified, uncomplicated: Secondary | ICD-10-CM

## 2024-08-22 DIAGNOSIS — M545 Low back pain, unspecified: Secondary | ICD-10-CM

## 2024-08-22 DIAGNOSIS — E871 Hypo-osmolality and hyponatremia: Secondary | ICD-10-CM

## 2024-08-22 DIAGNOSIS — E876 Hypokalemia: Secondary | ICD-10-CM

## 2024-08-22 DIAGNOSIS — F10930 Alcohol use, unspecified with withdrawal, uncomplicated: Secondary | ICD-10-CM

## 2024-08-22 DIAGNOSIS — D62 Acute posthemorrhagic anemia: Secondary | ICD-10-CM | POA: Diagnosis not present

## 2024-08-22 DIAGNOSIS — M150 Primary generalized (osteo)arthritis: Secondary | ICD-10-CM

## 2024-08-22 DIAGNOSIS — K7010 Alcoholic hepatitis without ascites: Secondary | ICD-10-CM

## 2024-08-22 MED ORDER — MELOXICAM 7.5 MG PO TABS: 7.5000 mg | ORAL_TABLET | Freq: Every day | ORAL | 5 refills | Status: AC | PRN

## 2024-08-22 MED ORDER — TRAMADOL HCL 50 MG PO TABS: 50.0000 mg | ORAL_TABLET | Freq: Four times a day (QID) | ORAL | 0 refills | Status: AC | PRN

## 2024-08-22 MED ORDER — NICOTINE 14 MG/24HR TD PT24: 14.0000 mg | MEDICATED_PATCH | Freq: Every day | TRANSDERMAL | 2 refills | Status: AC

## 2024-08-22 MED ORDER — GABAPENTIN 300 MG PO CAPS: 300.0000 mg | ORAL_CAPSULE | Freq: Every day | ORAL | 3 refills | Status: AC

## 2024-08-22 NOTE — Progress Notes (Unsigned)
 "  Established Patient Office Visit  Subjective   Patient ID: Caleb Kim, male    DOB: 08-Mar-1961  Age: 63 y.o. MRN: [id number]  Chief Complaint  Patient presents with   Hospitalization Follow-up    HPI  Hospital Course by listed problems    Severe hyponatremia, exacerbation of chronic hyponatremia likely secondary to beer potomania Presented with serum sodium of 109, not eating, excessive alcohol  use Denies headache or dizziness Na slowly improving now up to 134 Encourage oral intake as tolerated Pt responded well to IV fluid hydration   Alcohol  abuse with concern for alcohol  withdrawal Follow alcohol  level CIWA protocol was used  TOC consulted to provide resources for complete alcohol  cessation. Increased librium  dose to 50 mg TID and added spiritus frumenti Now tapering down librium  and he completed spiritus frumenti  He is much better now back to baseline.  PT evaluated him and did not recommend SNF.   After speaking with TOC he agreed to outpatient PT   Esophagitis seen on CT scan Guaiac positive stool and melena x 2 IV PPI, Protonix  40 mg twice daily Requested GI evaluation for possible endoscopy DC lovenox  and order SCDs for DVT prevention GI team planning upper endoscopy on 12/22  EGD with Dr. Cindie on 12/22 with findings of LA grade D esophagitis without bleeding, biopsies taken, Gastritis, normal duodenal bulb -ok to DC today on soft diet, protonix  40 mg BID, return to GI clinic in 4 weeks, repeat EGD in 12 weeks to monitor response to therapy, avoid alcohol     Hypokalemia Serum potassium repleted with Kdur    Hypomagnesemia Repleted intravenously   Chronic back pain Osteoarthritis Pain control   Generalized weakness PT/OT evaluation Fall precaution   Severe hepatic steatosis and mild hepatomegaly.   Recommend complete alcohol  cessation Avoid hepatotoxic agents.   Incidental findings seen on CT scan: Cholelithiasis without pericholecystic  inflammatory change and without biliary ductal dilatation.     Moderate bladder distention, moderate prostatic hypertrophy. Monitor for urine retention.  Patient Active Problem List   Diagnosis Date Noted   ABLA (acute blood loss anemia) 08/13/2024   Melena 08/12/2024   Abnormal CT scan, esophagus 08/12/2024   Alcoholic hepatitis without ascites (HCC) 08/12/2024   Lumbar pain 05/03/2024   Liver fibrosis 09/28/2023   Elevated liver enzymes 09/28/2023   Normocytic anemia 09/28/2023   Colon cancer screening 09/28/2023   Osteoarthritis 08/13/2023   Urinary incontinence 08/13/2023   Chronic cough 08/13/2023   Elevated blood pressure reading 04/12/2023   Left hip pain 12/04/2022   Dehydration 05/15/2022   Alcohol  abuse 05/15/2022   Alcohol  withdrawal (HCC) 01/02/2020   Hyponatremia 12/31/2019   Hypokalemia 12/31/2019   Alcoholic intoxication without complication 12/31/2019   Tobacco abuse 12/31/2019   Vomiting 12/31/2019   Intractable hiccups 12/31/2019   Anorexia 12/31/2019   Transaminasemia    Closed fracture of right side of body of mandible with routine healing 05/10/2017   Closed fracture of right zygomatic arch (HCC) 05/10/2017    ROS    Objective:     BP 114/69   Pulse 85   Ht 5' 6 (1.676 m)   Wt 135 lb (61.2 kg)   SpO2 97%   BMI 21.79 kg/m  BP Readings from Last 3 Encounters:  08/22/24 114/69  08/14/24 122/70  06/01/24 121/82   Wt Readings from Last 3 Encounters:  08/22/24 135 lb (61.2 kg)  08/10/24 120 lb 5.9 oz (54.6 kg)  06/01/24 116 lb 1.9 oz (52.7 kg)  Physical Exam   No results found for any visits on 08/22/24.  {Labs (Optional):23779}  The 10-year ASCVD risk score (Arnett DK, et al., 2019) is: 11.5%    Assessment & Plan:   Problem List Items Addressed This Visit   None   No follow-ups on file.    Leita Longs, FNP  "

## 2024-08-23 LAB — BASIC METABOLIC PANEL WITH GFR
BUN/Creatinine Ratio: 15 (ref 10–24)
BUN: 12 mg/dL (ref 8–27)
CO2: 22 mmol/L (ref 20–29)
Calcium: 9.4 mg/dL (ref 8.6–10.2)
Chloride: 102 mmol/L (ref 96–106)
Creatinine, Ser: 0.79 mg/dL (ref 0.76–1.27)
Glucose: 88 mg/dL (ref 70–99)
Potassium: 3.9 mmol/L (ref 3.5–5.2)
Sodium: 140 mmol/L (ref 134–144)
eGFR: 101 mL/min/1.73

## 2024-08-23 LAB — CBC WITH DIFFERENTIAL/PLATELET
Basophils Absolute: 0.1 x10E3/uL (ref 0.0–0.2)
Basos: 1 %
EOS (ABSOLUTE): 0 x10E3/uL (ref 0.0–0.4)
Eos: 1 %
Hematocrit: 31.5 % — ABNORMAL LOW (ref 37.5–51.0)
Hemoglobin: 10 g/dL — ABNORMAL LOW (ref 13.0–17.7)
Immature Grans (Abs): 0.1 x10E3/uL (ref 0.0–0.1)
Immature Granulocytes: 1 %
Lymphocytes Absolute: 1.6 x10E3/uL (ref 0.7–3.1)
Lymphs: 21 %
MCH: 30.9 pg (ref 26.6–33.0)
MCHC: 31.7 g/dL (ref 31.5–35.7)
MCV: 97 fL (ref 79–97)
Monocytes Absolute: 0.6 x10E3/uL (ref 0.1–0.9)
Monocytes: 8 %
Neutrophils Absolute: 5.3 x10E3/uL (ref 1.4–7.0)
Neutrophils: 68 %
Platelets: 564 x10E3/uL — ABNORMAL HIGH (ref 150–450)
RBC: 3.24 x10E6/uL — ABNORMAL LOW (ref 4.14–5.80)
RDW: 11.8 % (ref 11.6–15.4)
WBC: 7.6 x10E3/uL (ref 3.4–10.8)

## 2024-08-24 ENCOUNTER — Encounter (HOSPITAL_COMMUNITY): Payer: Self-pay | Admitting: Emergency Medicine

## 2024-08-24 ENCOUNTER — Other Ambulatory Visit: Payer: Self-pay

## 2024-08-24 ENCOUNTER — Emergency Department (HOSPITAL_COMMUNITY)
Admission: EM | Admit: 2024-08-24 | Discharge: 2024-08-24 | Disposition: A | Discharge status: Home / Self Care | Payer: MEDICAID | Attending: Emergency Medicine | Admitting: Emergency Medicine

## 2024-08-24 DIAGNOSIS — Z8659 Personal history of other mental and behavioral disorders: Secondary | ICD-10-CM | POA: Diagnosis present

## 2024-08-24 DIAGNOSIS — F1011 Alcohol abuse, in remission: Secondary | ICD-10-CM

## 2024-08-24 DIAGNOSIS — Z79899 Other long term (current) drug therapy: Secondary | ICD-10-CM | POA: Insufficient documentation

## 2024-08-24 DIAGNOSIS — R4182 Altered mental status, unspecified: Secondary | ICD-10-CM | POA: Insufficient documentation

## 2024-08-24 LAB — CBC
HCT: 29.6 % — ABNORMAL LOW (ref 39.0–52.0)
Hemoglobin: 9.3 g/dL — ABNORMAL LOW (ref 13.0–17.0)
MCH: 31.1 pg (ref 26.0–34.0)
MCHC: 31.4 g/dL (ref 30.0–36.0)
MCV: 99 fL (ref 80.0–100.0)
Platelets: 544 K/uL — ABNORMAL HIGH (ref 150–400)
RBC: 2.99 MIL/uL — ABNORMAL LOW (ref 4.22–5.81)
RDW: 12.7 % (ref 11.5–15.5)
WBC: 7.2 K/uL (ref 4.0–10.5)
nRBC: 0 % (ref 0.0–0.2)

## 2024-08-24 LAB — COMPREHENSIVE METABOLIC PANEL WITH GFR
ALT: 33 U/L (ref 0–44)
AST: 44 U/L — ABNORMAL HIGH (ref 15–41)
Albumin: 3.7 g/dL (ref 3.5–5.0)
Alkaline Phosphatase: 87 U/L (ref 38–126)
Anion gap: 13 (ref 5–15)
BUN: 11 mg/dL (ref 8–23)
CO2: 25 mmol/L (ref 22–32)
Calcium: 9.3 mg/dL (ref 8.9–10.3)
Chloride: 105 mmol/L (ref 98–111)
Creatinine, Ser: 0.76 mg/dL (ref 0.61–1.24)
GFR, Estimated: >60 mL/min
Glucose, Bld: 81 mg/dL (ref 70–99)
Potassium: 4.2 mmol/L (ref 3.5–5.1)
Sodium: 142 mmol/L (ref 135–145)
Total Bilirubin: 0.4 mg/dL (ref 0.0–1.2)
Total Protein: 7.8 g/dL (ref 6.5–8.1)

## 2024-08-24 LAB — URINE DRUG SCREEN
Amphetamines: NEGATIVE
Barbiturates: NEGATIVE
Benzodiazepines: POSITIVE — AB
Cocaine: NEGATIVE
Fentanyl: NEGATIVE
Methadone Scn, Ur: NEGATIVE
Opiates: NEGATIVE
Tetrahydrocannabinol: NEGATIVE

## 2024-08-24 LAB — ETHANOL: Alcohol, Ethyl (B): <15 mg/dL

## 2024-08-24 NOTE — Discharge Instructions (Signed)
 Do not start drinking again and follow-up with your family doctor if any problems

## 2024-08-24 NOTE — ED Triage Notes (Signed)
 Patient brought in IVCd by bother. Police picked patient up from his residence where he lives with his mother. Patients brother states that patient drinks heavily and has been throwing up. Patient states he has not been drinking. Reports no SI/HI.

## 2024-08-24 NOTE — ED Provider Notes (Signed)
 " [place] EMERGENCY DEPARTMENT AT [facility] Provider Note   CSN: [id number] Arrival date & time: 08/24/24  1501     Patient presents with: No chief complaint on file.   Caleb Kim is a 64 y.o. male.  {Add pertinent medical, surgical, social history, OB history to YEP:67052} Patient was brought to the emergency department because his brother took out IVC papers on him.  Supposedly he has been drinking alcohol  and not eating properly.  I spoke to the patient and he stated he moved in with his mother 5 weeks ago and has not been drinking since then.  She will not let him drink in the house.  Patient is not suicidal or homicidal   Altered Mental Status      Prior to Admission medications  Medication Sig Start Date End Date Taking? Authorizing Provider  albuterol  (VENTOLIN  HFA) 108 (90 Base) MCG/ACT inhaler Inhale 2 puffs into the lungs every 6 (six) hours as needed for wheezing or shortness of breath. 12/04/22   Del Wilhelmena Lloyd Sola, FNP  folic acid  (FOLVITE ) 1 MG tablet Take 1 tablet (1 mg total) by mouth daily. 08/14/24   Johnson, Clanford L, MD  gabapentin (NEURONTIN) 300 MG capsule Take 1 capsule (300 mg total) by mouth at bedtime. 08/22/24   Bevely Doffing, FNP  meloxicam  (MOBIC ) 7.5 MG tablet Take 1 tablet (7.5 mg total) by mouth daily as needed for pain. 08/22/24   Bevely Doffing, FNP  Multiple Vitamin (MULTIVITAMIN WITH MINERALS) TABS tablet Take 1 tablet by mouth daily. 08/14/24   Johnson, Clanford L, MD  nicotine  (NICODERM CQ ) 14 mg/24hr patch Place 1 patch (14 mg total) onto the skin daily. 08/22/24   Bevely Doffing, FNP  pantoprazole  (PROTONIX ) 40 MG tablet Take 1 tablet (40 mg total) by mouth 2 (two) times daily. 08/14/24   Johnson, Clanford L, MD  thiamine  (VITAMIN B-1) 100 MG tablet Take 1 tablet (100 mg total) by mouth daily. 08/15/24   Johnson, Clanford L, MD  traMADol  (ULTRAM ) 50 MG tablet Take 1 tablet (50 mg total) by mouth every 6 (six) hours as  needed for up to 5 days for moderate pain (pain score 4-6). 08/22/24 08/27/24  Bevely Doffing, FNP    Allergies: Patient has no known allergies.    Review of Systems  Updated Vital Signs BP 117/75   Pulse 76   Temp 98.1 F (36.7 C) (Oral)   Resp 17   Ht 5' 6 (1.676 m)   Wt 61.2 kg   SpO2 100%   BMI 21.79 kg/m   Physical Exam  (all labs ordered are listed, but only abnormal results are displayed) Labs Reviewed  COMPREHENSIVE METABOLIC PANEL WITH GFR - Abnormal; Notable for the following components:      Result Value   AST 44 (*)    All other components within normal limits  CBC - Abnormal; Notable for the following components:   RBC 2.99 (*)    Hemoglobin 9.3 (*)    HCT 29.6 (*)    Platelets 544 (*)    All other components within normal limits  URINE DRUG SCREEN - Abnormal; Notable for the following components:   Benzodiazepines POSITIVE (*)    All other components within normal limits  ETHANOL    EKG: None  Radiology: No results found.  {Document cardiac monitor, telemetry assessment procedure when appropriate:32947} Procedures   Medications Ordered in the ED - No data to display    {Click here for ABCD2, HEART  and other calculators REFRESH Note before signing:1}                              Medical Decision Making Amount and/or Complexity of Data Reviewed Labs: ordered.   History of alcohol  abuse.  Patient has stopped drinking for the last 5 weeks.  I have rescinded the IVC that his brother took out on him and he will follow-up with his PCP and continue to not drink alcohol   {Document critical care time when appropriate  Document review of labs and clinical decision tools ie CHADS2VASC2, etc  Document your independent review of radiology images and any outside records  Document your discussion with family members, caretakers and with consultants  Document social determinants of health affecting pt's care  Document your decision making why or why not  admission, treatments were needed:32947:::1}   Final diagnoses:  History of ETOH abuse    ED Discharge Orders     None        "

## 2024-08-25 ENCOUNTER — Ambulatory Visit: Payer: Self-pay

## 2024-08-25 NOTE — Assessment & Plan Note (Signed)
 Previous hospitalization revealed low potassium and sodium levels. - Rechecked potassium and sodium levels.
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
## 2024-08-25 NOTE — Assessment & Plan Note (Addendum)
 States that he hasn't had any alcohol  in 6 weeks.  Previous hospitalization revealed low potassium and sodium levels. - Rechecked potassium and sodium levels. Gabapentin added at bedtime for ongoing insomnia.

## 2024-08-25 NOTE — Assessment & Plan Note (Signed)
 Recent hospitalization for alcoholic hepatitis. No alcohol  use in the past three weeks. Liver function concerns necessitate avoiding hepatotoxic medications. - Avoid acetaminophen  due to hepatotoxicity. - Use NSAIDs like ibuprofen  or naproxen for pain management.

## 2024-08-28 ENCOUNTER — Ambulatory Visit: Payer: Self-pay | Admitting: Gastroenterology

## 2024-09-04 ENCOUNTER — Encounter (HOSPITAL_COMMUNITY): Payer: Self-pay | Admitting: Emergency Medicine

## 2024-09-04 ENCOUNTER — Emergency Department (HOSPITAL_COMMUNITY)
Admission: EM | Admit: 2024-09-04 | Discharge: 2024-09-05 | Disposition: A | Payer: MEDICAID | Attending: Emergency Medicine | Admitting: Emergency Medicine

## 2024-09-04 ENCOUNTER — Other Ambulatory Visit: Payer: Self-pay

## 2024-09-04 DIAGNOSIS — E876 Hypokalemia: Secondary | ICD-10-CM | POA: Diagnosis not present

## 2024-09-04 DIAGNOSIS — E871 Hypo-osmolality and hyponatremia: Secondary | ICD-10-CM | POA: Diagnosis not present

## 2024-09-04 DIAGNOSIS — R066 Hiccough: Secondary | ICD-10-CM

## 2024-09-04 DIAGNOSIS — Y909 Presence of alcohol in blood, level not specified: Secondary | ICD-10-CM | POA: Insufficient documentation

## 2024-09-04 DIAGNOSIS — F10230 Alcohol dependence with withdrawal, uncomplicated: Secondary | ICD-10-CM | POA: Insufficient documentation

## 2024-09-04 DIAGNOSIS — F10930 Alcohol use, unspecified with withdrawal, uncomplicated: Secondary | ICD-10-CM

## 2024-09-04 MED ORDER — FAMOTIDINE IN NACL 20-0.9 MG/50ML-% IV SOLN
20.0000 mg | Freq: Once | INTRAVENOUS | Status: AC
  Administered 2024-09-05: 20 mg via INTRAVENOUS
  Filled 2024-09-04: qty 50

## 2024-09-04 MED ORDER — LACTATED RINGERS IV BOLUS
1000.0000 mL | Freq: Once | INTRAVENOUS | Status: AC
  Administered 2024-09-05: 1000 mL via INTRAVENOUS

## 2024-09-04 MED ORDER — PANTOPRAZOLE SODIUM 40 MG IV SOLR
40.0000 mg | Freq: Once | INTRAVENOUS | Status: AC
  Administered 2024-09-05: 40 mg via INTRAVENOUS
  Filled 2024-09-04: qty 10

## 2024-09-04 NOTE — ED Triage Notes (Signed)
 Pt c/o hiccups x 3 weeks. Pt states he has been drinking alcohol  today.

## 2024-09-04 NOTE — ED Provider Notes (Signed)
 " [place] EMERGENCY DEPARTMENT AT [facility] Provider Note   CSN: [id number] Arrival date & time: 09/04/24  2256     Patient presents with: Hiccups   Caleb Kim is a 64 y.o. male.  {Add pertinent medical, surgical, social history, OB history to YEP:67052} Patient presents to the ER for hiccups that are resolved.  Patient states that he has had hiccups in the past 1 time requiring hospital visit where he had hiccups for 3 days.  On review of the records it appears that time he was actually admitted for hyponatremia likely related to alcohol  use disorder.  Patient states he started having hiccups again today.  He is not sure why.  He took him medicine called Zenna that an over-the-counter medication as mom had not seem to help the hiccups.  When I discussed with him I had never heard of that medication whether or not it could be senna he was not sure.  UA did have some alcohol  today.  Decreased food intake.  No real medical history such as diabetes, hypertension, coronary artery disease.  No lung issues that he knows of.  Never had surgery on his stomach.        Prior to Admission medications  Medication Sig Start Date End Date Taking? Authorizing Provider  albuterol  (VENTOLIN  HFA) 108 (90 Base) MCG/ACT inhaler Inhale 2 puffs into the lungs every 6 (six) hours as needed for wheezing or shortness of breath. 12/04/22   Del Wilhelmena Lloyd Sola, FNP  folic acid  (FOLVITE ) 1 MG tablet Take 1 tablet (1 mg total) by mouth daily. 08/14/24   Johnson, Clanford L, MD  gabapentin  (NEURONTIN ) 300 MG capsule Take 1 capsule (300 mg total) by mouth at bedtime. 08/22/24   Bevely Doffing, FNP  meloxicam  (MOBIC ) 7.5 MG tablet Take 1 tablet (7.5 mg total) by mouth daily as needed for pain. 08/22/24   Bevely Doffing, FNP  Multiple Vitamin (MULTIVITAMIN WITH MINERALS) TABS tablet Take 1 tablet by mouth daily. 08/14/24   Johnson, Clanford L, MD  nicotine  (NICODERM CQ ) 14 mg/24hr patch Place 1  patch (14 mg total) onto the skin daily. 08/22/24   Bevely Doffing, FNP  pantoprazole  (PROTONIX ) 40 MG tablet Take 1 tablet (40 mg total) by mouth 2 (two) times daily. 08/14/24   Johnson, Clanford L, MD  thiamine  (VITAMIN B-1) 100 MG tablet Take 1 tablet (100 mg total) by mouth daily. 08/15/24   Vicci Afton CROME, MD    Allergies: Patient has no known allergies.    Review of Systems  Updated Vital Signs BP 118/79   Pulse 98   Temp 98.1 F (36.7 C) (Oral)   Resp 17   Ht 5' 6 (1.676 m)   Wt 61 kg   SpO2 96%   BMI 21.71 kg/m   Physical Exam Vitals and nursing note reviewed.  Constitutional:      Appearance: He is well-developed.  HENT:     Head: Normocephalic and atraumatic.  Cardiovascular:     Rate and Rhythm: Normal rate.  Pulmonary:     Effort: Pulmonary effort is normal. No respiratory distress.  Abdominal:     General: There is no distension.  Musculoskeletal:        General: Normal range of motion.     Cervical back: Normal range of motion.  Neurological:     Mental Status: He is alert.     (all labs ordered are listed, but only abnormal results are displayed) Labs Reviewed  CBC WITH  DIFFERENTIAL/PLATELET  COMPREHENSIVE METABOLIC PANEL WITH GFR    EKG: None  Radiology: No results found.  {Document cardiac monitor, telemetry assessment procedure when appropriate:32947} Procedures   Medications Ordered in the ED  lactated ringers  bolus 1,000 mL (has no administration in time range)      {Click here for ABCD2, HEART and other calculators REFRESH Note before signing:1}                              Medical Decision Making Amount and/or Complexity of Data Reviewed Labs: ordered. ECG/medicine tests: ordered.  Will check some basic labs consider possible imaging with his alcohol  use to see if there is any other secondary causes for his hiccups.  No longer hiccuping at this time. ***  {Document critical care time when appropriate  Document review  of labs and clinical decision tools ie CHADS2VASC2, etc  Document your independent review of radiology images and any outside records  Document your discussion with family members, caretakers and with consultants  Document social determinants of health affecting pt's care  Document your decision making why or why not admission, treatments were needed:32947:::1}   Final diagnoses:  None    ED Discharge Orders     None        "

## 2024-09-05 ENCOUNTER — Emergency Department (HOSPITAL_COMMUNITY): Payer: MEDICAID

## 2024-09-05 LAB — CBC WITH DIFFERENTIAL/PLATELET
Abs Immature Granulocytes: 0.03 K/uL (ref 0.00–0.07)
Basophils Absolute: 0 K/uL (ref 0.0–0.1)
Basophils Relative: 0 %
Eosinophils Absolute: 0 K/uL (ref 0.0–0.5)
Eosinophils Relative: 0 %
HCT: 39.2 % (ref 39.0–52.0)
Hemoglobin: 12.8 g/dL — ABNORMAL LOW (ref 13.0–17.0)
Immature Granulocytes: 0 %
Lymphocytes Relative: 13 %
Lymphs Abs: 1 K/uL (ref 0.7–4.0)
MCH: 31 pg (ref 26.0–34.0)
MCHC: 32.7 g/dL (ref 30.0–36.0)
MCV: 94.9 fL (ref 80.0–100.0)
Monocytes Absolute: 0.7 K/uL (ref 0.1–1.0)
Monocytes Relative: 8 %
Neutro Abs: 6.5 K/uL (ref 1.7–7.7)
Neutrophils Relative %: 79 %
Platelets: 306 K/uL (ref 150–400)
RBC: 4.13 MIL/uL — ABNORMAL LOW (ref 4.22–5.81)
RDW: 13.6 % (ref 11.5–15.5)
WBC: 8.2 K/uL (ref 4.0–10.5)
nRBC: 0 % (ref 0.0–0.2)

## 2024-09-05 LAB — COMPREHENSIVE METABOLIC PANEL WITH GFR
ALT: 17 U/L (ref 0–44)
AST: 31 U/L (ref 15–41)
Albumin: 4 g/dL (ref 3.5–5.0)
Alkaline Phosphatase: 92 U/L (ref 38–126)
Anion gap: 18 — ABNORMAL HIGH (ref 5–15)
BUN: <5 mg/dL — ABNORMAL LOW (ref 8–23)
CO2: 22 mmol/L (ref 22–32)
Calcium: 8.7 mg/dL — ABNORMAL LOW (ref 8.9–10.3)
Chloride: 87 mmol/L — ABNORMAL LOW (ref 98–111)
Creatinine, Ser: 0.64 mg/dL (ref 0.61–1.24)
GFR, Estimated: >60 mL/min
Glucose, Bld: 96 mg/dL (ref 70–99)
Potassium: 3.4 mmol/L — ABNORMAL LOW (ref 3.5–5.1)
Sodium: 127 mmol/L — ABNORMAL LOW (ref 135–145)
Total Bilirubin: 0.6 mg/dL (ref 0.0–1.2)
Total Protein: 8.2 g/dL — ABNORMAL HIGH (ref 6.5–8.1)

## 2024-09-05 MED ORDER — IOHEXOL 300 MG/ML  SOLN
100.0000 mL | Freq: Once | INTRAMUSCULAR | Status: AC | PRN
  Administered 2024-09-05: 100 mL via INTRAVENOUS

## 2024-09-05 MED ORDER — CHLORDIAZEPOXIDE HCL 25 MG PO CAPS: ORAL_CAPSULE | ORAL | 0 refills | Status: DC

## 2024-09-05 MED ORDER — CHLORPROMAZINE HCL 25 MG PO TABS
25.0000 mg | ORAL_TABLET | Freq: Once | ORAL | Status: AC
  Administered 2024-09-05: 25 mg via ORAL
  Filled 2024-09-05: qty 1

## 2024-09-05 MED ORDER — CHLORDIAZEPOXIDE HCL 25 MG PO CAPS: ORAL_CAPSULE | ORAL | 0 refills | Status: AC

## 2024-09-05 MED ORDER — CHLORPROMAZINE HCL 25 MG PO TABS: 25.0000 mg | ORAL_TABLET | Freq: Three times a day (TID) | ORAL | 0 refills | Status: AC | PRN

## 2024-09-05 MED ORDER — LORAZEPAM 1 MG PO TABS
1.0000 mg | ORAL_TABLET | Freq: Once | ORAL | Status: AC
  Administered 2024-09-05: 1 mg via ORAL
  Filled 2024-09-05: qty 1

## 2024-09-05 MED ORDER — POTASSIUM CHLORIDE 10 MEQ/100ML IV SOLN
10.0000 meq | Freq: Once | INTRAVENOUS | Status: AC
  Administered 2024-09-05: 10 meq via INTRAVENOUS
  Filled 2024-09-05: qty 100

## 2024-09-05 MED ORDER — CALCIUM GLUCONATE-NACL 1-0.675 GM/50ML-% IV SOLN
1.0000 g | Freq: Once | INTRAVENOUS | Status: AC
  Administered 2024-09-05: 1000 mg via INTRAVENOUS
  Filled 2024-09-05: qty 50

## 2024-09-05 MED ORDER — SODIUM CHLORIDE 0.9 % IV BOLUS
1000.0000 mL | Freq: Once | INTRAVENOUS | Status: AC
  Administered 2024-09-05: 1000 mL via INTRAVENOUS
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
## 2024-09-07 ENCOUNTER — Encounter (HOSPITAL_COMMUNITY): Payer: Self-pay | Admitting: Internal Medicine

## 2024-09-28 NOTE — Therapy (Incomplete)
 " OUTPATIENT PHYSICAL THERAPY LOWER EXTREMITY EVALUATION   Patient Name: Caleb Kim MRN: [id number] DOB:[birth year], 64 y.o., male Today's Date: 09/28/2024  END OF SESSION:   Past Medical History:  Diagnosis Date   Alcohol  abuse 05/15/2022   Alcohol  withdrawal (HCC)    Alcoholic intoxication without complication 12/31/2019   Chronic cough 08/13/2023   Elevated liver enzymes 09/28/2023   Hypokalemia 12/31/2019   Hyponatremia 12/31/2019   Intractable hiccups 12/31/2019   Liver fibrosis 09/28/2023   Osteoarthritis    Urinary incontinence 08/13/2023   Vomiting 12/31/2019   Past Surgical History:  Procedure Laterality Date   ESOPHAGOGASTRODUODENOSCOPY N/A 08/14/2024   Procedure: EGD (ESOPHAGOGASTRODUODENOSCOPY);  Surgeon: Cindie Carlin POUR, DO;  Location: [facility];  Service: Endoscopy;  Laterality: N/A;   EXPLORATORY LAPAROTOMY     Patient Active Problem List   Diagnosis Date Noted   ABLA (acute blood loss anemia) 08/13/2024   Melena 08/12/2024   Abnormal CT scan, esophagus 08/12/2024   Alcoholic hepatitis without ascites (HCC) 08/12/2024   Lumbar pain 05/03/2024   Liver fibrosis 09/28/2023   Elevated liver enzymes 09/28/2023   Normocytic anemia 09/28/2023   Colon cancer screening 09/28/2023   Osteoarthritis 08/13/2023   Urinary incontinence 08/13/2023   Chronic cough 08/13/2023   Elevated blood pressure reading 04/12/2023   Left hip pain 12/04/2022   Dehydration 05/15/2022   Alcohol  abuse 05/15/2022   Alcohol  withdrawal (HCC) 01/02/2020   Hyponatremia 12/31/2019   Hypokalemia 12/31/2019   Alcoholic intoxication without complication 12/31/2019   Tobacco abuse 12/31/2019   Vomiting 12/31/2019   Intractable hiccups 12/31/2019   Anorexia 12/31/2019   Transaminasemia    Closed fracture of right side of body of mandible with routine healing 05/10/2017   Closed fracture of right zygomatic arch (HCC) 05/10/2017    PCP: Terry Wilhelmena Lloyd Hilario, FNP  REFERRING  PROVIDER: Vicci Afton CROME, MD  REFERRING DIAG: E87.1 (ICD-10-CM) - Hyponatremia; weakness  THERAPY DIAG:  No diagnosis found.  Rationale for Evaluation and Treatment: Rehabilitation  ONSET DATE: ***  SUBJECTIVE:   SUBJECTIVE STATEMENT: ***  PERTINENT HISTORY: *** PAIN:  Are you having pain? {OPRCPAIN:27236}  PRECAUTIONS: {Therapy precautions:24002}  RED FLAGS: {PT Red Flags:29287}   WEIGHT BEARING RESTRICTIONS: {Yes ***/No:24003}  FALLS:  Has patient fallen in last 6 months? {fallsyesno:27318}  LIVING ENVIRONMENT: Lives with: {OPRC lives with:25569::lives with their family} Lives in: {Lives in:25570} Stairs: {opstairs:27293} Has following equipment at home: {Assistive devices:23999}  OCCUPATION: ***  PLOF: {PLOF:24004}  PATIENT GOALS: ***  NEXT MD VISIT: ***  OBJECTIVE:  Note: Objective measures were completed at Evaluation unless otherwise noted.  DIAGNOSTIC FINDINGS: ***  PATIENT SURVEYS:  {rehab surveys:24030}  COGNITION: Overall cognitive status: {cognition:24006}     SENSATION: {sensation:27233}  EDEMA:  {edema:24020}  MUSCLE LENGTH: Hamstrings: Right *** deg; Left *** deg Debby test: Right *** deg; Left *** deg  POSTURE: {posture:25561}  PALPATION: ***  LOWER EXTREMITY ROM:  {AROM/PROM:27142} ROM Right eval Left eval  Hip flexion    Hip extension    Hip abduction    Hip adduction    Hip internal rotation    Hip external rotation    Knee flexion    Knee extension    Ankle dorsiflexion    Ankle plantarflexion    Ankle inversion    Ankle eversion     (Blank rows = not tested)  LOWER EXTREMITY MMT:  MMT Right eval Left eval  Hip flexion    Hip extension    Hip abduction  Hip adduction    Hip internal rotation    Hip external rotation    Knee flexion    Knee extension    Ankle dorsiflexion    Ankle plantarflexion    Ankle inversion    Ankle eversion     (Blank rows = not tested)  LOWER EXTREMITY  SPECIAL TESTS:  {LEspecialtests:26242}  FUNCTIONAL TESTS:  {Functional tests:24029}  GAIT: Distance walked: *** Assistive device utilized: {Assistive devices:23999} Level of assistance: {Levels of assistance:24026} Comments: ***                                                                                                                                TREATMENT DATE: 09/29/2024 physical therapy evaluation and HEP instruction    PATIENT EDUCATION:  Education details: Patient educated on exam findings, POC, scope of PT, HEP, and ***. Person educated: Patient Education method: Explanation, Demonstration, and Handouts Education comprehension: verbalized understanding, returned demonstration, verbal cues required, and tactile cues required  HOME EXERCISE PROGRAM: ***  ASSESSMENT:  CLINICAL IMPRESSION: Patient is a 64 y.o. male who was seen today for physical therapy evaluation and treatment for E87.1 (ICD-10-CM) - Hyponatremia.   OBJECTIVE IMPAIRMENTS: {opptimpairments:25111}.   ACTIVITY LIMITATIONS: {activitylimitations:27494}  PARTICIPATION LIMITATIONS: {participationrestrictions:25113}  PERSONAL FACTORS: {Personal factors:25162} are also affecting patient's functional outcome.   REHAB POTENTIAL: Good  CLINICAL DECISION MAKING: Evolving/moderate complexity  EVALUATION COMPLEXITY: Moderate   GOALS: Goals reviewed with patient? No  SHORT TERM GOALS: Target date: *** *** Baseline: Goal status: INITIAL  2.  *** Baseline:  Goal status: INITIAL  3.  *** Baseline:  Goal status: INITIAL  4.  *** Baseline:  Goal status: INITIAL  5.  *** Baseline:  Goal status: INITIAL  6.  *** Baseline:  Goal status: INITIAL  LONG TERM GOALS: Target date: ***  *** Baseline:  Goal status: INITIAL  2.  *** Baseline:  Goal status: INITIAL  3.  *** Baseline:  Goal status: INITIAL  4.  *** Baseline:  Goal status: INITIAL  5.  *** Baseline:  Goal status:  INITIAL  6.  *** Baseline:  Goal status: INITIAL   PLAN:  PT FREQUENCY: {rehab frequency:25116}  PT DURATION: {rehab duration:25117}  PLANNED INTERVENTIONS: 97164- PT Re-evaluation, 97110-Therapeutic exercises, 97530- Therapeutic activity, 97112- Neuromuscular re-education, 97535- Self Care, 02859- Manual therapy, [id number]- Gait training, [id number]- Orthotic Fit/training, [id number]- Canalith repositioning, [id number]- Aquatic Therapy, 97760- Splinting, 97597- Wound care (first 20 sq cm), 97598- Wound care (each additional 20 sq cm)Patient/Family education, Balance training, Stair training, Taping, Dry Needling, Joint mobilization, Joint manipulation, Spinal manipulation, Spinal mobilization, Scar mobilization, and DME instructions.   PLAN FOR NEXT SESSION: Review HEP and goals    7:16 AM, 09/29/24 [place] Nolton Small Odeal Welden MPT [place] [facility] [place] [phone] [phone]  "

## 2024-09-29 ENCOUNTER — Telehealth (HOSPITAL_COMMUNITY): Payer: Self-pay

## 2024-09-29 ENCOUNTER — Ambulatory Visit (HOSPITAL_COMMUNITY): Payer: MEDICAID

## 2024-09-29 NOTE — Telephone Encounter (Signed)
 No answer and unable to leave a message regarding missed appointment for PT evaluation.    1:58 PM, 09/29/24 Rosiland Sen Small Al[place] Dowd MPT [facility] [place] [phone]

## 2024-10-03 ENCOUNTER — Ambulatory Visit: Payer: MEDICAID | Admitting: Gastroenterology

## 2024-10-05 ENCOUNTER — Ambulatory Visit: Payer: MEDICAID | Admitting: Family Medicine

## 2024-11-15 ENCOUNTER — Ambulatory Visit: Payer: Self-pay
